# Patient Record
Sex: Female | Born: 1935 | Race: Black or African American | Hispanic: No | Smoking: Never smoker
Health system: Southern US, Community
[De-identification: ages and names within clinical notes are randomized; demographics above are authoritative.]

## PROBLEM LIST (undated history)

## (undated) DIAGNOSIS — M24573 Contracture, unspecified ankle: Secondary | ICD-10-CM

## (undated) DIAGNOSIS — F32A Depression, unspecified: Secondary | ICD-10-CM

## (undated) DIAGNOSIS — N189 Chronic kidney disease, unspecified: Secondary | ICD-10-CM

## (undated) DIAGNOSIS — G8929 Other chronic pain: Secondary | ICD-10-CM

## (undated) DIAGNOSIS — M24576 Contracture, unspecified foot: Secondary | ICD-10-CM

## (undated) DIAGNOSIS — K5900 Constipation, unspecified: Secondary | ICD-10-CM

## (undated) DIAGNOSIS — F329 Major depressive disorder, single episode, unspecified: Secondary | ICD-10-CM

## (undated) DIAGNOSIS — M1990 Unspecified osteoarthritis, unspecified site: Secondary | ICD-10-CM

## (undated) DIAGNOSIS — E785 Hyperlipidemia, unspecified: Secondary | ICD-10-CM

## (undated) DIAGNOSIS — I10 Essential (primary) hypertension: Secondary | ICD-10-CM

## (undated) DIAGNOSIS — R279 Unspecified lack of coordination: Secondary | ICD-10-CM

## (undated) DIAGNOSIS — K219 Gastro-esophageal reflux disease without esophagitis: Secondary | ICD-10-CM

## (undated) DIAGNOSIS — I5022 Chronic systolic (congestive) heart failure: Secondary | ICD-10-CM

## (undated) DIAGNOSIS — I4891 Unspecified atrial fibrillation: Secondary | ICD-10-CM

## (undated) DIAGNOSIS — Z9981 Dependence on supplemental oxygen: Secondary | ICD-10-CM

## (undated) DIAGNOSIS — M6281 Muscle weakness (generalized): Secondary | ICD-10-CM

## (undated) DIAGNOSIS — R609 Edema, unspecified: Secondary | ICD-10-CM

## (undated) DIAGNOSIS — I509 Heart failure, unspecified: Secondary | ICD-10-CM

## (undated) HISTORY — PX: OTHER SURGICAL HISTORY: SHX169

## (undated) HISTORY — DX: Unspecified lack of coordination: R27.9

## (undated) HISTORY — DX: Contracture, unspecified foot: M24.573

## (undated) HISTORY — DX: Depression, unspecified: F32.A

## (undated) HISTORY — DX: Chronic systolic (congestive) heart failure: I50.22

## (undated) HISTORY — DX: Constipation, unspecified: K59.00

## (undated) HISTORY — DX: Contracture, unspecified ankle: M24.576

## (undated) HISTORY — DX: Unspecified atrial fibrillation: I48.91

## (undated) HISTORY — DX: Major depressive disorder, single episode, unspecified: F32.9

## (undated) HISTORY — DX: Muscle weakness (generalized): M62.81

## (undated) HISTORY — DX: Edema, unspecified: R60.9

## (undated) HISTORY — DX: Other chronic pain: G89.29

## (undated) HISTORY — DX: Heart failure, unspecified: I50.9

## (undated) HISTORY — DX: Unspecified osteoarthritis, unspecified site: M19.90

## (undated) HISTORY — DX: Essential (primary) hypertension: I10

## (undated) HISTORY — DX: Chronic kidney disease, unspecified: N18.9

## (undated) HISTORY — DX: Dependence on supplemental oxygen: Z99.81

## (undated) HISTORY — DX: Gastro-esophageal reflux disease without esophagitis: K21.9

## (undated) HISTORY — PX: FRACTURE SURGERY: SHX138

## (undated) HISTORY — DX: Hyperlipidemia, unspecified: E78.5

---

## 2012-03-22 ENCOUNTER — Other Ambulatory Visit: Payer: Self-pay | Admitting: Internal Medicine

## 2012-03-22 DIAGNOSIS — M25512 Pain in left shoulder: Secondary | ICD-10-CM

## 2012-03-26 ENCOUNTER — Ambulatory Visit (HOSPITAL_COMMUNITY)
Admission: RE | Admit: 2012-03-26 | Discharge: 2012-03-26 | Disposition: A | Discharge status: Home / Self Care | Payer: Medicare Other | Source: Ambulatory Visit | Attending: Internal Medicine | Admitting: Internal Medicine

## 2012-03-26 DIAGNOSIS — M779 Enthesopathy, unspecified: Secondary | ICD-10-CM | POA: Insufficient documentation

## 2012-03-26 DIAGNOSIS — S43439A Superior glenoid labrum lesion of unspecified shoulder, initial encounter: Secondary | ICD-10-CM | POA: Insufficient documentation

## 2012-03-26 DIAGNOSIS — M25512 Pain in left shoulder: Secondary | ICD-10-CM

## 2012-03-26 DIAGNOSIS — S43429A Sprain of unspecified rotator cuff capsule, initial encounter: Secondary | ICD-10-CM | POA: Insufficient documentation

## 2012-03-26 DIAGNOSIS — X58XXXA Exposure to other specified factors, initial encounter: Secondary | ICD-10-CM | POA: Insufficient documentation

## 2012-03-26 DIAGNOSIS — S43499A Other sprain of unspecified shoulder joint, initial encounter: Secondary | ICD-10-CM | POA: Insufficient documentation

## 2012-03-26 DIAGNOSIS — S46819A Strain of other muscles, fascia and tendons at shoulder and upper arm level, unspecified arm, initial encounter: Secondary | ICD-10-CM | POA: Insufficient documentation

## 2012-07-04 ENCOUNTER — Non-Acute Institutional Stay (SKILLED_NURSING_FACILITY): Payer: Commercial Managed Care - PPO | Admitting: Internal Medicine

## 2012-07-04 DIAGNOSIS — I4891 Unspecified atrial fibrillation: Secondary | ICD-10-CM

## 2012-07-04 DIAGNOSIS — I509 Heart failure, unspecified: Secondary | ICD-10-CM

## 2012-07-04 DIAGNOSIS — I10 Essential (primary) hypertension: Secondary | ICD-10-CM

## 2012-07-04 DIAGNOSIS — E876 Hypokalemia: Secondary | ICD-10-CM

## 2012-07-04 NOTE — Progress Notes (Signed)
PROGRESS NOTE  DATE: 07/04/12  FACILITY: [facility]  LEVEL OF CARE: SNF  Routine Visit  CHIEF COMPLAINT:  Manage CHF, atrial fibrillation and hypertension  HISTORY OF PRESENT ILLNESS:  REASSESSMENT OF ONGOING PROBLEMS:  1. CHF:The patient relates to significant weight changes, denies sob, DOE, orthopnea, PNDs, palpitations or chest pain.  CHF is  unstable.  No complications form the medications being used.  I was notified by the dietitian that patient has been having significant weight gain. She has gained 11.9% which is 19.2 pounds 180 days.  On 3/24 weight was 118.4, 3/7 teammate was 176.6, 3/10 weight was 172. Patient admits to increased lower extremity swelling.  2. ATRIAL FIBRILLATION: the patients a-fib remains stable.  The patient denies DOE, tachycardia, orthopnea, transient neurological sx, pedal edema, palpitations, & PNDs.  No complications noted from the medications currently being used.  3. HTN: Pt 's HTN remains stable.  Denies CP, sob, DOE, pedal edema, headaches, dizziness or visual disturbances.  No complications from the meds currently being used.  Last BP : 125/70.  PAST MEDICAL HISTORY : Reviewed.  No changes.  CURRENT MEDICATIONS: Reviewed per [facility]  REVIEW OF SYSTEMS:  GENERAL: no change in appetite, no fatigue, has weight changes, no fever, chills or weakness RESPIRATORY: no cough, SOB, DOE,, wheezing, hemoptysis CARDIAC: no chest pain, or palpitations. Complains of increased bilateral lower extremity swelling GI: no abdominal pain, diarrhea, constipation, heart burn, nausea or vomiting  PHYSICAL EXAMINATION  VS:  T 98              P 63            RR 20          BP             POX %                WT (Lb) 180.4  GENERAL: no acute distress, normal body habitus EYES: conjunctivae normal, sclerae normal, normal eye lids NECK: supple, trachea midline, no neck masses, no thyroid tenderness, no thyromegaly LYMPHATICS: no LAN in the neck, no supraclavicular  LAN RESPIRATORY: breathing is even & unlabored, BS CTAB CARDIAC: RRR, no murmur,no extra heart sounds, +2 edema bilaterally GI: abdomen soft, normal BS, no masses, no tenderness, no hepatomegaly, no splenomegaly PSYCHIATRIC: the patient is alert & oriented to person, affect & behavior appropriate  LABS/RADIOLOGY:  12/13 BMP normal 8/13 BMP and CBC normal, total protein 5.7, albumin 3.2 otherwise liver profile normal, TSH 3.772, vitamin B12 level greater than 2000  ASSESSMENT/PLAN:  1. CHF exacerbation-unstable problem. Start Lasix 20 mg daily. Continue wwts. 2 atrial fibrillation-rate controlled 3. hypertension-well-controlled 4. hypokalemia - since I am initiating Lasix we'll start KCL 10 mEq daily as well. 5. constipation-well-controlled. 6. V58.69- check CBC and CMP.  CPT CODE: 32440

## 2012-07-18 ENCOUNTER — Non-Acute Institutional Stay (SKILLED_NURSING_FACILITY): Payer: Medicare Other | Admitting: Internal Medicine

## 2012-07-18 DIAGNOSIS — R05 Cough: Secondary | ICD-10-CM

## 2012-07-31 NOTE — Progress Notes (Signed)
[name], female   DOB: [birth year], 79 y.o.   MRN: [id number]        PROGRESS NOTE  DATE:  07/18/2012   FACILITY: [facility]   LEVEL OF CARE: SNF  Acute Visit  CHIEF COMPLAINT:  Manage cough.    HISTORY OF PRESENT ILLNESS: I was requested by the staff to assess the patient regarding above problem(s):  Staff report that patient is having a productive cough with mucus for several days.  Patient denies shortness of breath, fever, chills or night sweats.  Patient cannot identify precipitating or alleviating factors.  There is no temporal relationship.  PAST MEDICAL HISTORY : Reviewed.  No changes.  CURRENT MEDICATIONS: Reviewed per [facility]  REVIEW OF SYSTEMS:  GENERAL: no change in appetite, no fatigue, no weight changes, no fever, chills or weakness RESPIRATORY: complains of productive cough, SOB, DOE,, wheezing, hemoptysis CARDIAC: no chest pain, edema or palpitations  PHYSICAL EXAMINATION  VS:  T  98.3      P 68       RR  18     BP 111/67     POX % 97 room air      WT (Lb)  GENERAL: no acute distress, normal body habitus NECK: supple, trachea midline, no neck masses, no thyroid tenderness, no thyromegaly LYMPHATICS: no LAN in the neck, no supraclavicular LAN RESPIRATORY: breathing is even & unlabored, BS CTAB CARDIAC: RRR, no murmur,no extra heart sounds EDEMA/VARICOSITIES:  +2 bilateral lower extremity edema  ARTERIAL:  pedal pulses nonpalpable   ASSESSMENT/PLAN:  Cough. New problem.  Obtain chest x-ray.  Start Mucinex 600 mg b.i.d. For one week.    CPT CODE: 40981

## 2012-08-06 ENCOUNTER — Other Ambulatory Visit: Payer: Self-pay | Admitting: *Deleted

## 2012-08-06 MED ORDER — OXYCODONE HCL 5 MG PO TABS: ORAL_TABLET | ORAL | Status: DC

## 2012-08-26 ENCOUNTER — Encounter: Payer: Self-pay | Admitting: Adult Health

## 2012-08-26 ENCOUNTER — Non-Acute Institutional Stay (SKILLED_NURSING_FACILITY): Payer: Medicare Other | Admitting: Adult Health

## 2012-08-26 DIAGNOSIS — I4891 Unspecified atrial fibrillation: Secondary | ICD-10-CM

## 2012-08-26 DIAGNOSIS — K5900 Constipation, unspecified: Secondary | ICD-10-CM

## 2012-08-26 DIAGNOSIS — M1990 Unspecified osteoarthritis, unspecified site: Secondary | ICD-10-CM

## 2012-08-26 DIAGNOSIS — I509 Heart failure, unspecified: Secondary | ICD-10-CM

## 2012-08-26 DIAGNOSIS — I10 Essential (primary) hypertension: Secondary | ICD-10-CM

## 2012-08-26 NOTE — Progress Notes (Signed)
[name], female   DOB: Feb 04, 1923, 79 y.o.   MRN: [id number] [facility]  Allergies  Allergen Reactions  . Codeine      Chief Complaint  Patient presents with  . Medical Managment of Chronic Issues    HPI: She is being seen for the management of her chronic illnesses; there is no change in her overall status staff does not report any concerns at this time.   Past Medical History  Diagnosis Date  . CHF (congestive heart failure)   . A-fib   . Hypertension   . Constipation   . Arthritis    Past Surgical History  Procedure Laterality Date  . Right ankle orif      VITAL SIGNS BP 105/56  Pulse 90  Wt 179 lb (81.194 kg)   Patient's Medications  New Prescriptions   No medications on file  Previous Medications   AMIODARONE (PACERONE) 200 MG TABLET    Take 200 mg by mouth daily.   CALCIUM CARBONATE (TUMS - DOSED IN MG ELEMENTAL CALCIUM) 500 MG CHEWABLE TABLET    Chew 1 tablet by mouth 3 (three) times daily.   CHOLECALCIFEROL (VITAMIN D) 1000 UNITS TABLET    Take 2,000 Units by mouth daily.   DICLOFENAC SODIUM (VOLTAREN) 1 % GEL    Apply 4 g topically 2 (two) times daily. To both knees   FLUTICASONE (FLONASE) 50 MCG/ACT NASAL SPRAY    Place 2 sprays into the nose daily.   FUROSEMIDE (LASIX) 80 MG TABLET    Take 80 mg by mouth 2 (two) times daily.   GLUCOSAMINE-CHONDROITIN 500-400 MG TABLET    Take 2 tablets by mouth 2 (two) times daily.   LISINOPRIL (PRINIVIL,ZESTRIL) 2.5 MG TABLET    Take 2.5 mg by mouth daily.   METOPROLOL SUCCINATE (TOPROL-XL) 25 MG 24 HR TABLET    Take 12.5 mg by mouth daily.   POLYETHYL GLYCOL-PROPYL GLYCOL (SYSTANE) 0.4-0.3 % SOLN    Apply 1 drop to eye daily.   POLYETHYLENE GLYCOL (MIRALAX / GLYCOLAX) PACKET    Take 17 g by mouth daily.   POTASSIUM CHLORIDE SA (K-DUR,KLOR-CON) 20 MEQ TABLET    Take 40 mEq by mouth 2 (two) times daily.   RIVAROXABAN (XARELTO) 15 MG TABS TABLET    Take 15 mg by mouth daily.   SENNOSIDES-DOCUSATE SODIUM  (SENOKOT-S) 8.6-50 MG TABLET    Take 2 tablets by mouth daily.   VITAMIN B-12 (CYANOCOBALAMIN) 1000 MCG TABLET    Take 1,000 mcg by mouth daily.  Modified Medications   Modified Medication Previous Medication   OXYCODONE (OXY IR/ROXICODONE) 5 MG IMMEDIATE RELEASE TABLET oxyCODONE (OXY IR/ROXICODONE) 5 MG immediate release tablet      Take 1 tablet every 3 hours as needed for pain    Take 1 tablet every 3 hours as needed.  Discontinued Medications   No medications on file    SIGNIFICANT DIAGNOSTIC EXAMS 03-22-12: glucose 103; bun 28; creat 0.95; k+ 4.8; na++ 133 07-11-12: glucose 89; bun 24; creat 1.15; k+ 3.9; na++ 139   Review of Systems  Constitutional: Negative for malaise/fatigue.  Respiratory: Negative for cough and shortness of breath.   Cardiovascular: Positive for leg swelling. Negative for chest pain.  Gastrointestinal: Negative for heartburn, abdominal pain and constipation.  Musculoskeletal: Negative for myalgias and joint pain.  Skin: Negative.   Neurological: Negative for headaches.  Psychiatric/Behavioral: Negative for depression. The patient does not have insomnia.      Physical Exam  Constitutional:  She appears well-developed and well-nourished.  Neck: Neck supple. No JVD present. No thyromegaly present.  Cardiovascular: Normal rate, regular rhythm and intact distal pulses.   Respiratory: Effort normal and breath sounds normal. No respiratory distress.  GI: Soft. Bowel sounds are normal. She exhibits no distension. There is no tenderness.  Musculoskeletal: She exhibits edema.  In wheelchair; 2+ pedal edema; wears ted hose  Neurological: She is alert.  Skin: Skin is warm and dry.       ASSESSMENT/ PLAN:  A-fib Is stable will continue xarelto 15 mg daily and will continue amiodarone 200 mg daily for rate control and will monitor her status   Essential hypertension, benign Will continue lisinopril 2.5 mg daily and toprol xl 12.5 mg daily and will  monitor  CHF (congestive heart failure) Will continue lasix 80 mg twice daily with k+ 40 meq twice daily and will monitor her status   Unspecified constipation Will continue miralax daily and will continue senna s 2 tabs daily   Osteoarthritis Has in multiple joints will continue voltaren gel 4 gm in both knees twice daily and will continue glucosamine chondroitin 2 tabs twice daily and will continue oxycodone 5 mg every 3 hours as needed and will monitor     Time spent with patient 45 minutes

## 2012-09-10 ENCOUNTER — Non-Acute Institutional Stay (SKILLED_NURSING_FACILITY): Payer: Medicare Other | Admitting: Internal Medicine

## 2012-09-10 DIAGNOSIS — M75110 Incomplete rotator cuff tear or rupture of unspecified shoulder, not specified as traumatic: Secondary | ICD-10-CM

## 2012-09-20 ENCOUNTER — Other Ambulatory Visit (HOSPITAL_COMMUNITY): Payer: Self-pay | Admitting: Unknown Physician Specialty

## 2012-09-20 ENCOUNTER — Other Ambulatory Visit (HOSPITAL_COMMUNITY): Payer: Self-pay | Admitting: Cardiology

## 2012-09-20 DIAGNOSIS — I509 Heart failure, unspecified: Secondary | ICD-10-CM

## 2012-09-20 DIAGNOSIS — R0602 Shortness of breath: Secondary | ICD-10-CM

## 2012-09-24 ENCOUNTER — Ambulatory Visit (HOSPITAL_COMMUNITY)
Admission: RE | Admit: 2012-09-24 | Discharge: 2012-09-24 | Disposition: A | Discharge status: Home / Self Care | Payer: Medicare Other | Source: Ambulatory Visit | Attending: Internal Medicine | Admitting: Internal Medicine

## 2012-09-24 DIAGNOSIS — R0602 Shortness of breath: Secondary | ICD-10-CM

## 2012-09-24 DIAGNOSIS — I10 Essential (primary) hypertension: Secondary | ICD-10-CM | POA: Insufficient documentation

## 2012-09-24 DIAGNOSIS — I509 Heart failure, unspecified: Secondary | ICD-10-CM | POA: Insufficient documentation

## 2012-09-24 DIAGNOSIS — E119 Type 2 diabetes mellitus without complications: Secondary | ICD-10-CM | POA: Insufficient documentation

## 2012-09-24 NOTE — Progress Notes (Signed)
Echo Lab  2D Echocardiogram completed.  Jerrel Tiberio L Darianny Momon, RDCS 09/24/2012 1:18 PM

## 2012-09-30 DIAGNOSIS — M75110 Incomplete rotator cuff tear or rupture of unspecified shoulder, not specified as traumatic: Secondary | ICD-10-CM | POA: Insufficient documentation

## 2012-09-30 NOTE — Progress Notes (Signed)
[name], female   DOB: 1923/02/24, 79 y.o.   MRN: [id number]        PROGRESS NOTE  DATE: 09/10/2012  FACILITY:  [facility]  LEVEL OF CARE: SNF (31)  Acute Visit  CHIEF COMPLAINT:  Manage left shoulder pain.    HISTORY OF PRESENT ILLNESS: I was requested by the staff to assess the patient regarding above problem(s):  Patient has a diagnosis of rotator cuff tear in her left shoulder.  She was seen by Orthopedic Surgery and a cortisone injection given, but she says there has not been much relief.  Surgery was not recommended.  Only p.r.n. pain medications were recommended.  Patient denies radiation of pain.  Range of motion exacerbates pain.  Current pain medications are not sufficiently controlling her pain.  She denies symptoms such as numbness, tingling, or weakness.  There is no temporal relationship.    PAST MEDICAL HISTORY : Reviewed.  No changes.  CURRENT MEDICATIONS: Reviewed per [facility]  REVIEW OF SYSTEMS:  GENERAL: no change in appetite, no fatigue, no weight changes, no fever, chills or weakness RESPIRATORY: no cough, SOB, DOE,, wheezing, hemoptysis CARDIAC: no chest pain, edema or palpitations GI: no abdominal pain, diarrhea, constipation, heart burn, nausea or vomiting  PHYSICAL EXAMINATION  VS:  T  97.4      P  56     RR  20     BP 105/56     POX %       WT (Lb)  GENERAL: no acute distress, normal body habitus NECK: supple, trachea midline, no neck masses, no thyroid tenderness, no thyromegaly RESPIRATORY: breathing is even & unlabored, BS CTAB CARDIAC: RRR, no murmur,no extra heart sounds, no edema GI: abdomen soft, normal BS, no masses, no tenderness, no hepatomegaly, no splenomegaly PSYCHIATRIC: the patient is alert & oriented to person, affect & behavior appropriate MUSCULOSKELETAL:   EXTREMITIES:   LEFT UPPER EXTREMITY:  Left shoulder tender to palpation.  Strength intact.  Range of motion difficult to perform due to  pain.    ASSESSMENT/PLAN:  Left shoulder rotator cuff tear 726.13.  Uncontrolled pain.  Start Lidoderm patch 5% to the left shoulder for 12 hours a day.    CPT CODE: 13086

## 2012-10-01 ENCOUNTER — Other Ambulatory Visit: Payer: Self-pay | Admitting: *Deleted

## 2012-10-01 MED ORDER — OXYCODONE HCL 5 MG PO TABS: ORAL_TABLET | ORAL | Status: DC

## 2012-10-14 ENCOUNTER — Non-Acute Institutional Stay (SKILLED_NURSING_FACILITY): Payer: Medicare Other | Admitting: Adult Health

## 2012-10-14 DIAGNOSIS — K5900 Constipation, unspecified: Secondary | ICD-10-CM

## 2012-10-14 DIAGNOSIS — I5040 Unspecified combined systolic (congestive) and diastolic (congestive) heart failure: Secondary | ICD-10-CM

## 2012-10-14 DIAGNOSIS — I10 Essential (primary) hypertension: Secondary | ICD-10-CM

## 2012-10-14 DIAGNOSIS — M1990 Unspecified osteoarthritis, unspecified site: Secondary | ICD-10-CM

## 2012-10-14 DIAGNOSIS — I4891 Unspecified atrial fibrillation: Secondary | ICD-10-CM

## 2012-10-16 ENCOUNTER — Encounter: Payer: Self-pay | Admitting: Adult Health

## 2012-10-16 ENCOUNTER — Non-Acute Institutional Stay (SKILLED_NURSING_FACILITY): Payer: Medicare Other | Admitting: Adult Health

## 2012-10-16 DIAGNOSIS — K5900 Constipation, unspecified: Secondary | ICD-10-CM | POA: Insufficient documentation

## 2012-10-16 DIAGNOSIS — I5040 Unspecified combined systolic (congestive) and diastolic (congestive) heart failure: Secondary | ICD-10-CM

## 2012-10-16 DIAGNOSIS — I5042 Chronic combined systolic (congestive) and diastolic (congestive) heart failure: Secondary | ICD-10-CM | POA: Insufficient documentation

## 2012-10-16 DIAGNOSIS — M1990 Unspecified osteoarthritis, unspecified site: Secondary | ICD-10-CM | POA: Insufficient documentation

## 2012-10-16 DIAGNOSIS — I110 Hypertensive heart disease with heart failure: Secondary | ICD-10-CM | POA: Insufficient documentation

## 2012-10-16 DIAGNOSIS — I480 Paroxysmal atrial fibrillation: Secondary | ICD-10-CM | POA: Insufficient documentation

## 2012-10-16 DIAGNOSIS — R5383 Other fatigue: Secondary | ICD-10-CM | POA: Insufficient documentation

## 2012-10-16 DIAGNOSIS — R5381 Other malaise: Secondary | ICD-10-CM | POA: Insufficient documentation

## 2012-10-16 NOTE — Assessment & Plan Note (Addendum)
Will continue her xarelto 15 mg daily and amiodarone 200 mg daily and digoxin 0.125 mg daily  for rate control will continue to monitor her status

## 2012-10-16 NOTE — Assessment & Plan Note (Addendum)
Has been seen by cardiology; her lasix was changed to demadex 20 mg twice daily with k+ 40 meq twice daily will continue her 1800 cc fluid restriction  and will monitor her status

## 2012-10-16 NOTE — Assessment & Plan Note (Signed)
Has in multiple joints will continue voltaren gel 4 gm in both knees twice daily and will continue glucosamine chondroitin 2 tabs twice daily and will continue oxycodone 5 mg every 3 hours as needed and will monitor

## 2012-10-16 NOTE — Assessment & Plan Note (Signed)
Is stable will continue lisinopril 2.5 mg daily and toprol xl 12. 5 mg daily and will continue to monitor

## 2012-10-16 NOTE — Assessment & Plan Note (Signed)
Is stable will continue xarelto 15 mg daily and will continue amiodarone 200 mg daily for rate control and will monitor her status

## 2012-10-16 NOTE — Progress Notes (Signed)
[name], female   DOB: 1922-07-19, 79 y.o.   MRN: [id number]  [name], female   DOB: [birth year], 79 y.o.   MRN: [id number] [facility]  Allergies  Allergen Reactions  . Codeine      Chief Complaint  Patient presents with  . Medical Managment of Chronic Issues    HPI: She is being seen for the management of her chronic illnesses; there is no change in her overall status She is complaining of left shoulder pain   Past Medical History  Diagnosis Date  . CHF (congestive heart failure)   . A-fib   . Hypertension   . Constipation   . Arthritis    Past Surgical History  Procedure Laterality Date  . Right ankle orif      VITAL SIGNS BP 123/64  Pulse 58  Ht 5\' 4"  (1.626 m)  Wt 182 lb 6.4 oz (82.736 kg)  BMI 31.29 kg/m2   Patient's Medications  New Prescriptions   No medications on file  Previous Medications   AMIODARONE (PACERONE) 200 MG TABLET    Take 200 mg by mouth daily.   CALCIUM CARBONATE (TUMS - DOSED IN MG ELEMENTAL CALCIUM) 500 MG CHEWABLE TABLET    Chew 1 tablet by mouth 3 (three) times daily.   CHOLECALCIFEROL (VITAMIN D) 1000 UNITS TABLET    Take 2,000 Units by mouth daily.   DICLOFENAC SODIUM (VOLTAREN) 1 % GEL    Apply 4 g topically 2 (two) times daily. To both knees   FLUTICASONE (FLONASE) 50 MCG/ACT NASAL SPRAY    Place 2 sprays into the nose daily.   GLUCOSAMINE-CHONDROITIN 500-400 MG TABLET    Take 2 tablets by mouth 2 (two) times daily.   LIDOCAINE (LIDODERM) 5 %    Place 1 patch onto the skin daily. Remove & Discard patch within 12 hours to left shoulder   LISINOPRIL (PRINIVIL,ZESTRIL) 2.5 MG TABLET    Take 2.5 mg by mouth daily.   METOPROLOL SUCCINATE (TOPROL-XL) 25 MG 24 HR TABLET    Take 12.5 mg by mouth daily.   OXYCODONE (OXY IR/ROXICODONE) 5 MG IMMEDIATE RELEASE TABLET    Take 1 tablet every 3 hours as needed for pain   POLYETHYL GLYCOL-PROPYL GLYCOL (SYSTANE) 0.4-0.3 % SOLN    Apply 1 drop to eye daily.   POLYETHYLENE GLYCOL (MIRALAX / GLYCOLAX) PACKET    Take 17 g by mouth daily.   POTASSIUM CHLORIDE SA (K-DUR,KLOR-CON) 20 MEQ TABLET    Take 40 mEq by mouth 2 (two) times daily.   RIVAROXABAN (XARELTO) 15 MG TABS TABLET    Take 15 mg by mouth daily.   SENNOSIDES-DOCUSATE SODIUM (SENOKOT-S) 8.6-50 MG TABLET    Take 2 tablets by mouth daily.   TORSEMIDE (DEMADEX) 20 MG TABLET    Take 20 mg by mouth 2 (two) times daily.   VITAMIN B-12 (CYANOCOBALAMIN) 1000 MCG TABLET    Take 1,000 mcg by mouth daily.  Modified Medications   No medications on file  Discontinued Medications   FUROSEMIDE (LASIX) 80 MG TABLET    Take 80 mg by mouth 2 (two) times daily.    SIGNIFICANT DIAGNOSTIC EXAMS  09-24-12: 2-d echo: - Left ventricle: The cavity size was moderately dilated.There was mild concentric hypertrophy. Systolic function was severely reduced. The estimated ejection fraction wasin the range of 20% to 25%. Diffuse hypokinesis.-- Mitral valve: Moderate regurgitation. - Left atrium: The atrium was mildly dilated.      LABS REVIEWED  03-22-12: glucose 103; bun 28; creat 0.95; k+ 4.8; na++ 133 07-11-12: glucose 89; bun 24; creat 1.15; k+ 3.9; na++ 139  08-22-12: wbc 8.7; hgb 11.2; hct 32.4; mcv 86.9; plt 287; chol 148; ldl 90; trig 76; tsh 1.085   Review of Systems  Constitutional: Negative for malaise/fatigue.  Respiratory: Negative for cough and shortness of breath.   Cardiovascular: Positive for leg swelling. Negative for chest pain.  Gastrointestinal: Negative for heartburn, abdominal pain and constipation.  Musculoskeletal: Positive for joint pain. Negative for myalgias.  Skin: Negative.   Neurological: Negative for headaches.  Psychiatric/Behavioral: Negative for depression. The patient does not have insomnia.      Physical Exam  Constitutional: She appears well-developed and well-nourished.  Neck: Neck supple. No JVD present. No thyromegaly present.  Cardiovascular: Normal rate, regular  rhythm and intact distal pulses.   Respiratory: Effort normal and breath sounds normal. No respiratory distress.  GI: Soft. Bowel sounds are normal. She exhibits no distension. There is no tenderness.  Musculoskeletal: She exhibits edema.  In wheelchair; 2+ pedal edema; wears ted hose  Neurological: She is alert.  Skin: Skin is warm and dry.       ASSESSMENT/ PLAN:  A-fib Will continue her xarelto 15 mg daily and amiodarone 200 mg daily and digoxin 0.125 mg daily  for rate control will continue to monitor her status   Essential hypertension, benign Is stable will continue lisinopril 2.5 mg daily and toprol xl 12. 5 mg daily and will continue to monitor   CHF (congestive heart failure) Has been seen by cardiology; her lasix was changed to demadex 20 mg twice daily with k+ 40 meq twice daily will continue her 1800 cc fluid restriction  and will monitor her status   Unspecified constipation Will continue senna s 2 tabs daily and miralax daily   Osteoarthritis She will be setup for a steroid injection in her left shoulder; will continue her voltaren gel to her knees twice daily and her glucosamine chondroitin 2 tabs twice daily with oxycodone 5 mg every 3 hours as needed for pain and her lidoderm patch to her left shoulder and will monitor her status

## 2012-10-16 NOTE — Assessment & Plan Note (Signed)
Will continue lasix 80 mg twice daily with k+ 40 meq twice daily and will monitor her status

## 2012-10-16 NOTE — Assessment & Plan Note (Signed)
There are several reasons which could be the cause of her fatigue; vitamin deficiency; (not likely as she is on supplement); hypothyroidism (her last tsh wast normal) her low ef of 20-30% and depression; will check tsh; b12; and folate; if these are normal; will then start her on medications to treat depression and will monitor her status

## 2012-10-16 NOTE — Assessment & Plan Note (Signed)
Will continue lisinopril 2.5 mg daily and toprol xl 12.5 mg daily and will monitor

## 2012-10-16 NOTE — Assessment & Plan Note (Signed)
Will continue miralax daily and will continue senna s 2 tabs daily

## 2012-10-16 NOTE — Progress Notes (Signed)
[name], female   DOB: 12/07/1922, 79 y.o.   MRN: [id number] [facility]  Allergies  Allergen Reactions  . Codeine      Chief Complaint  Patient presents with  . Acute Visit    patient concerns    HPI: She is complaining of feeling fatigues and tired all the time; she states she has no interest in her normal activities and wants to sleep all the time; she denies hair loss; or excessive dry skin; states her appetite is poor; does not feel rested in the am. We had a prolonged discussion about the possible causes of her issues; we had a prolonged discussion about her code status she desires to be a dnr.   Past Medical History  Diagnosis Date  . CHF (congestive heart failure)   . A-fib   . Hypertension   . Constipation   . Arthritis     Past Surgical History  Procedure Laterality Date  . Right ankle orif      VITAL SIGNS BP 100/59  Pulse 68  Ht 5\' 4"  (1.626 m)  Wt 182 lb 6.4 oz (82.736 kg)  BMI 31.29 kg/m2   Patient's Medications  New Prescriptions   No medications on file  Previous Medications   AMIODARONE (PACERONE) 200 MG TABLET    Take 200 mg by mouth daily.   CALCIUM CARBONATE (TUMS - DOSED IN MG ELEMENTAL CALCIUM) 500 MG CHEWABLE TABLET    Chew 1 tablet by mouth 3 (three) times daily.   CHOLECALCIFEROL (VITAMIN D) 1000 UNITS TABLET    Take 2,000 Units by mouth daily.   DICLOFENAC SODIUM (VOLTAREN) 1 % GEL    Apply 4 g topically 2 (two) times daily. To both knees   FLUTICASONE (FLONASE) 50 MCG/ACT NASAL SPRAY    Place 2 sprays into the nose daily.   GLUCOSAMINE-CHONDROITIN 500-400 MG TABLET    Take 2 tablets by mouth 2 (two) times daily.   LIDOCAINE (LIDODERM) 5 %    Place 1 patch onto the skin daily. Remove & Discard patch within 12 hours to left shoulder   LISINOPRIL (PRINIVIL,ZESTRIL) 2.5 MG TABLET    Take 2.5 mg by mouth daily.   METOPROLOL SUCCINATE (TOPROL-XL) 25 MG 24 HR TABLET    Take 12.5 mg by mouth daily.   OXYCODONE (OXY IR/ROXICODONE)  5 MG IMMEDIATE RELEASE TABLET    Take 1 tablet every 3 hours as needed for pain   POLYETHYL GLYCOL-PROPYL GLYCOL (SYSTANE) 0.4-0.3 % SOLN    Apply 1 drop to eye daily.   POLYETHYLENE GLYCOL (MIRALAX / GLYCOLAX) PACKET    Take 17 g by mouth daily.   POTASSIUM CHLORIDE SA (K-DUR,KLOR-CON) 20 MEQ TABLET    Take 40 mEq by mouth 2 (two) times daily.   RIVAROXABAN (XARELTO) 15 MG TABS TABLET    Take 15 mg by mouth daily.   SENNOSIDES-DOCUSATE SODIUM (SENOKOT-S) 8.6-50 MG TABLET    Take 2 tablets by mouth daily.   TORSEMIDE (DEMADEX) 20 MG TABLET    Take 20 mg by mouth 2 (two) times daily.   VITAMIN B-12 (CYANOCOBALAMIN) 1000 MCG TABLET    Take 1,000 mcg by mouth daily.  Modified Medications   No medications on file  Discontinued Medications   No medications on file    SIGNIFICANT DIAGNOSTIC EXAMS  07-18-12: chest x-ray: no acute abnormality present   09-24-12: 2-d echo: - Left ventricle: The cavity size was moderately dilated.There was mild concentric hypertrophy. Systolic function was severely reduced.  The estimated ejection fraction wasin the range of 20% to 25%. Diffuse hypokinesis.-- Mitral valve: Moderate regurgitation. - Left atrium: The atrium was mildly dilated.      LABS REVIEWED  03-22-12: glucose 103; bun 28; creat 0.95; k+ 4.8; na++ 133 07-11-12: glucose 89; bun 24; creat 1.15; k+ 3.9; na++ 139  08-22-12: wbc 8.7; hgb 11.2; hct 32.4; mcv 86.9; plt 287; chol 148; ldl 90; trig 76; tsh 1.085   Review of Systems  Constitutional: HAS malaise/fatigue.  Respiratory: Negative for cough and shortness of breath.   Cardiovascular: Positive for leg swelling. Negative for chest pain.  Gastrointestinal: Negative for heartburn, abdominal pain and constipation.  Musculoskeletal: Positive for joint pain. Negative for myalgias.  Skin: Negative.   Neurological: Negative for headaches.  Psychiatric/Behavioral: HAS for depression. The patient does not have insomnia.      Physical Exam   Constitutional: She appears well-developed and well-nourished.  Neck: Neck supple. No JVD present. No thyromegaly present.  Cardiovascular: Normal rate, regular rhythm and intact distal pulses.   Respiratory: Effort normal and breath sounds normal. No respiratory distress.  GI: Soft. Bowel sounds are normal. She exhibits no distension. There is no tenderness.  Musculoskeletal: She exhibits edema.  In wheelchair; 2+ pedal edema; wears ted hose  Neurological: She is alert.  Skin: Skin is warm and dry.    ASSESSMENT/ PLAN:  Other malaise and fatigue There are several reasons which could be the cause of her fatigue; vitamin deficiency; (not likely as she is on supplement); hypothyroidism (her last tsh wast normal) her low ef of 20-30% and depression; will check tsh; b12; and folate; if these are normal; will then start her on medications to treat depression and will monitor her status   CHF (congestive heart failure) Her ef is 20-30%; will continue demadex 20 mg twice daily with k+ 40 meq twice daily; will continue 1800 cc fluid restriction; digoxin .125 mg daily; and will monitor her status     Time spent with patient 50 minutes.

## 2012-10-16 NOTE — Assessment & Plan Note (Signed)
Will continue senna s 2 tabs daily and miralax daily

## 2012-10-16 NOTE — Assessment & Plan Note (Signed)
Her ef is 20-30%; will continue demadex 20 mg twice daily with k+ 40 meq twice daily; will continue 1800 cc fluid restriction; digoxin .125 mg daily; and will monitor her status

## 2012-10-16 NOTE — Assessment & Plan Note (Signed)
She will be setup for a steroid injection in her left shoulder; will continue her voltaren gel to her knees twice daily and her glucosamine chondroitin 2 tabs twice daily with oxycodone 5 mg every 3 hours as needed for pain and her lidoderm patch to her left shoulder and will monitor her status

## 2012-12-16 ENCOUNTER — Non-Acute Institutional Stay (SKILLED_NURSING_FACILITY): Payer: Medicare Other | Admitting: Family

## 2012-12-16 ENCOUNTER — Encounter: Payer: Self-pay | Admitting: Family

## 2012-12-16 DIAGNOSIS — M1990 Unspecified osteoarthritis, unspecified site: Secondary | ICD-10-CM

## 2012-12-16 DIAGNOSIS — K5900 Constipation, unspecified: Secondary | ICD-10-CM

## 2012-12-16 DIAGNOSIS — I4891 Unspecified atrial fibrillation: Secondary | ICD-10-CM

## 2012-12-16 DIAGNOSIS — I5040 Unspecified combined systolic (congestive) and diastolic (congestive) heart failure: Secondary | ICD-10-CM

## 2012-12-16 DIAGNOSIS — I10 Essential (primary) hypertension: Secondary | ICD-10-CM

## 2012-12-16 NOTE — Progress Notes (Signed)
[name], female   DOB: 1922/10/06, 79 y.o.   MRN: [id number]  Allergies  Allergen Reactions  . Codeine      Chief Complaint  Patient presents with  . Acute Visit    patient concerns    HPI: Pt is being followed for medical management of chronic illnesses. Nursing staff reported hypotensive episode over the weekend. B/P  70/48, HR 63.  Pt complained of intermittent weakness and nausea during hypotensive episode. Pt is also followed by cardiologist, Dr. Jacinto Halim, for CHF, Hypertension and Afib.  Pt denies complaints at present and reports being in good health. No further concerns voiced by patient or nursing staff at present.   Past Medical History  Diagnosis Date  . CHF (congestive heart failure)   . A-fib   . Hypertension   . Constipation   . Arthritis      History   Laterality Date   Past Surgical History  Procedure Laterality Date  . Right ankle orif     Current Outpatient Prescriptions on File Prior to Visit  Medication Sig Dispense Refill  . amiodarone (PACERONE) 200 MG tablet Take 200 mg by mouth daily.      . calcium carbonate (TUMS - DOSED IN MG ELEMENTAL CALCIUM) 500 MG chewable tablet Chew 1 tablet by mouth 3 (three) times daily.      . cholecalciferol (VITAMIN D) 1000 UNITS tablet Take 2,000 Units by mouth daily.      . diclofenac sodium (VOLTAREN) 1 % GEL Apply 4 g topically 2 (two) times daily. To both knees      . fluticasone (FLONASE) 50 MCG/ACT nasal spray Place 2 sprays into the nose daily.      Marland Kitchen glucosamine-chondroitin 500-400 MG tablet Take 2 tablets by mouth 2 (two) times daily.      Marland Kitchen lidocaine (LIDODERM) 5 % Place 1 patch onto the skin daily. Remove & Discard patch within 12 hours to left shoulder      . lisinopril (PRINIVIL,ZESTRIL) 2.5 MG tablet Take 2.5 mg by mouth daily.      . metoprolol succinate (TOPROL-XL) 25 MG 24 hr tablet Take 12.5 mg by mouth daily.      Marland Kitchen oxyCODONE (OXY IR/ROXICODONE) 5 MG immediate release tablet Take 1  tablet every 3 hours as needed for pain  240 tablet  0  . Polyethyl Glycol-Propyl Glycol (SYSTANE) 0.4-0.3 % SOLN Apply 1 drop to eye daily.      . polyethylene glycol (MIRALAX / GLYCOLAX) packet Take 17 g by mouth daily.      . potassium chloride SA (K-DUR,KLOR-CON) 20 MEQ tablet Take 40 mEq by mouth 2 (two) times daily.      . Rivaroxaban (XARELTO) 15 MG TABS tablet Take 15 mg by mouth daily.      . sennosides-docusate sodium (SENOKOT-S) 8.6-50 MG tablet Take 2 tablets by mouth daily.      Marland Kitchen torsemide (DEMADEX) 20 MG tablet Take 20 mg by mouth 2 (two) times daily.       No current facility-administered medications on file prior to visit.                         SIGNIFICANT DIAGNOSTIC EXAMS  07-18-12: chest x-ray: no acute abnormality present   09-24-12: 2-d echo: - Left ventricle: The cavity size was moderately dilated.There was mild concentric hypertrophy. Systolic function was severely reduced. The estimated ejection fraction wasin the range of 20% to 25%. Diffuse hypokinesis.-- Mitral  valve: Moderate regurgitation. - Left atrium: The atrium was mildly dilated.         LABS REVIEWED  03-22-12: glucose 103; bun 28; creat 0.95; k+ 4.8; na++ 133 07-11-12: glucose 89; bun 24; creat 1.15; k+ 3.9; na++ 139  08-22-12: wbc 8.7; hgb 11.2; hct 32.4; mcv 86.9; plt 287; chol 148; ldl 90; trig 76; tsh 1.085  10-18-12 WBC 4.02, RBC-4.02, Hemoglobin 11.8, Hemocrit 34.1, MCV 84.8, MCH 29.4, MCHC 34.6,RDW-14.3,Platlet 322, Sodium 139, Potassium 4.5, Chloride 101, C02-28, Glucose-93, BUN-26, Creatinine 1.43, Calcium 9.8, TSH 2.313, Vitamin B12, Folate 19.2  Review of Systems  Constitutional: Negative.  Negative for fever and chills.  HENT: Negative.   Eyes: Negative.   Respiratory: Negative.   Cardiovascular: Positive for leg swelling.  Gastrointestinal: Negative.   Musculoskeletal: Negative.   Skin: Negative.   Neurological: Negative.  Negative for weakness.  Endo/Heme/Allergies:  Negative.   Psychiatric/Behavioral: Negative.         Physical Exam  Constitutional: She appears well-nourished.  HENT:  Head: Normocephalic.  Nose: Nose normal.  Eyes: Conjunctivae are normal.  Neck: No thyromegaly present.  Cardiovascular: Regular rhythm.  Bradycardia present.   Pulmonary/Chest: Effort normal.  Abdominal: Soft. Normal appearance and bowel sounds are normal.  Musculoskeletal:  DME-Wheelchair  Lymphadenopathy:    She has no cervical adenopathy.  Neurological: She is alert. GCS eye subscore is 4. GCS verbal subscore is 5. GCS motor subscore is 5.  Skin: Skin is warm and dry.      ASSESSMENT/ PLAN:   CHF (congestive heart failure) Her ef is 20-30%; will continue demadex 20 mg twice daily with k+ 40 meq twice daily; will continue 1800 cc fluid restriction; digoxin .125 mg daily; and will monitor her status. Will collaborate with Dr. Jacinto Halim for further updates.  Afib  Will continue Amiodarone and Digoxin for rate control; Xarelto for anticoagulopathy  Essential Hypertension Parameters in place for administration of Antihypertensives and Digoxin.  Will continue to monitor and collaborate with health care team to promote therapeutic outcome   Constipation Will continue plan-prn Miralax and senna. Medications reviewed  Arthritis Pt is stable and will continue with previous plan        Time spent with patient 50 minutes.

## 2012-12-31 ENCOUNTER — Non-Acute Institutional Stay (SKILLED_NURSING_FACILITY): Payer: Medicare Other | Admitting: Family

## 2012-12-31 ENCOUNTER — Encounter: Payer: Self-pay | Admitting: Family

## 2012-12-31 DIAGNOSIS — I509 Heart failure, unspecified: Secondary | ICD-10-CM

## 2012-12-31 DIAGNOSIS — I10 Essential (primary) hypertension: Secondary | ICD-10-CM

## 2012-12-31 MED ORDER — TORSEMIDE 20 MG PO TABS: 40.0000 mg | ORAL_TABLET | Freq: Two times a day (BID) | ORAL | Status: DC

## 2012-12-31 NOTE — Progress Notes (Signed)
[name], female   DOB: 10-27-1922, 79 y.o.   MRN: [id number] Date: 12/27/12  Faciltiy: [facility]   Chief Complaint  Patient presents with  . Acute Visit    Weight increase/Edema    HPI: Pt presents requiring an acute visit due to 7.8lb increase in 4 days.  Pt denies SOB/N/V/D/Fever/Chills, dyspnea, or tachypnea. However, pt reports increasing edema in the past week. Pt describes BLE as tight and filled with fluid.  Pt further reports associated symptoms of intermittent generalized weakness. Pt reports relief is obtained with rest and lying in a supine position.  Pt endorses consuming some fried foods-chicken and foods saturated with refined sugars.       Allergies  Allergen Reactions  . Codeine    Medications    Medication List       This list is accurate as of: 12/31/12 11:38 AM.  Always use your most recent med list.               amiodarone 200 MG tablet  Commonly known as:  PACERONE  Take 200 mg by mouth daily.     calcium carbonate 500 MG chewable tablet  Commonly known as:  TUMS - dosed in mg elemental calcium  Chew 1 tablet by mouth 3 (three) times daily.     cholecalciferol 1000 UNITS tablet  Commonly known as:  VITAMIN D  Take 2,000 Units by mouth daily.     diclofenac sodium 1 % Gel  Commonly known as:  VOLTAREN  Apply 4 g topically 2 (two) times daily. To both knees     fluticasone 50 MCG/ACT nasal spray  Commonly known as:  FLONASE  Place 2 sprays into the nose daily.     glucosamine-chondroitin 500-400 MG tablet  Take 2 tablets by mouth 2 (two) times daily.     lidocaine 5 %  Commonly known as:  LIDODERM  Place 1 patch onto the skin daily. Remove & Discard patch within 12 hours to left shoulder     lisinopril 2.5 MG tablet  Commonly known as:  PRINIVIL,ZESTRIL  Take 2.5 mg by mouth daily.     metoprolol succinate 25 MG 24 hr tablet  Commonly known as:  TOPROL-XL  Take 12.5 mg by mouth daily.     oxyCODONE 5 MG immediate  release tablet  Commonly known as:  Oxy IR/ROXICODONE  Take 1 tablet every 3 hours as needed for pain     polyethylene glycol packet  Commonly known as:  MIRALAX / GLYCOLAX  Take 17 g by mouth daily.     potassium chloride SA 20 MEQ tablet  Commonly known as:  K-DUR,KLOR-CON  Take 40 mEq by mouth 2 (two) times daily.     Rivaroxaban 15 MG Tabs tablet  Commonly known as:  XARELTO  Take 15 mg by mouth daily.     sennosides-docusate sodium 8.6-50 MG tablet  Commonly known as:  SENOKOT-S  Take 2 tablets by mouth daily.     SYSTANE 0.4-0.3 % Soln  Generic drug:  Polyethyl Glycol-Propyl Glycol  Apply 1 drop to eye daily.     torsemide 20 MG tablet  Commonly known as:  DEMADEX  Take 20 mg by mouth 2 (two) times daily.         DATA REVIEWED  Laboratory Studies: 03-22-12: glucose 103; bun 28; creat 0.95; k+ 4.8; na++ 133 07-11-12: glucose 89; bun 24; creat 1.15; k+ 3.9; na++ 139  08-22-12: wbc 8.7; hgb 11.2; hct 32.4;  mcv 86.9; plt 287; chol 148; ldl 90; trig 76; tsh 1.085  10-18-12 WBC 4.02, RBC-4.02, Hemoglobin 11.8, Hemocrit 34.1, MCV 84.8, MCH 29.4, MCHC 34.6,RDW-14.3,Platlet 322, Sodium 139, Potassium 4.5, Chloride 101, C02-28, Glucose-93, BUN-26, Creatinine 1.43, Calcium 9.8, TSH 2.313, Vitamin B12, Folate 19.2     Past Medical History  Diagnosis Date  . CHF (congestive heart failure)   . A-fib   . Hypertension   . Constipation   . Arthritis     Past Surgical History  Procedure Laterality Date  . Right ankle orif      History   Social History  . Marital Status: Widowed    Spouse Name: N/A    Number of Children: N/A  . Years of Education: N/A   Occupational History  . Not on file.   Social History Main Topics  . Smoking status: Unknown If Ever Smoked  . Smokeless tobacco: Not on file  . Alcohol Use: Not on file  . Drug Use: Not on file  . Sexual Activity: Not on file   Other Topics Concern  . Not on file   Social History Narrative  . No narrative on  file     Review of Systems  Constitutional: Positive for malaise/fatigue.  HENT: Negative.   Eyes: Negative.   Respiratory: Negative.   Cardiovascular: Positive for leg swelling. Negative for chest pain, palpitations, orthopnea and PND.  Gastrointestinal: Negative.   Genitourinary: Negative.   Musculoskeletal: Negative.   Skin: Negative.   Neurological: Negative.   Endo/Heme/Allergies: Negative.   Psychiatric/Behavioral: Negative.      Physical Exam Filed Vitals:   12/27/12 1126  BP: 129/77  Pulse: 67  Temp: 98.1 F (36.7 C)  Resp: 18  Weight: 179 lb 6.4 oz (81.375 kg)  SpO2: 98%   Body mass index is 30.78 kg/(m^2). Physical Exam  Constitutional: She appears well-developed and well-nourished. No distress.  HENT:  Head: Normocephalic.  Eyes: Pupils are equal, round, and reactive to light.  Neck: No JVD present. No tracheal deviation present. No thyromegaly present.  Cardiovascular: Normal rate and regular rhythm.   Pulses:      Radial pulses are 2+ on the right side, and 2+ on the left side.       Dorsalis pedis pulses are 1+ on the right side, and 1+ on the left side.  2+ BLE edema  Pulmonary/Chest: Effort normal and breath sounds normal. No respiratory distress. She exhibits no tenderness.  Lymphadenopathy:    She has no cervical adenopathy.    ASSESSMENT/PLAN  Edema d/t CHF exacerbation: Increase Torsemide to 40 mg bid for 3 days, obtain daily weights, monitor for CV/respiratory distress and schedule appt with cardiologist, Dr. Jacinto Halim. Hypertension: patient is currently stable on treatment regimen, please continue. Reinforce low salt (NAS) diet and decrease intake of refined/processed foods.  Follow up: Monday, Sept. 22, 2014

## 2013-03-10 ENCOUNTER — Other Ambulatory Visit: Payer: Self-pay | Admitting: Vascular Surgery

## 2013-03-10 DIAGNOSIS — I70219 Atherosclerosis of native arteries of extremities with intermittent claudication, unspecified extremity: Secondary | ICD-10-CM

## 2013-03-11 ENCOUNTER — Encounter: Payer: Self-pay | Admitting: Vascular Surgery

## 2013-03-27 ENCOUNTER — Other Ambulatory Visit: Payer: Self-pay | Admitting: *Deleted

## 2013-03-27 MED ORDER — OXYCODONE HCL 5 MG PO TABS: ORAL_TABLET | ORAL | Status: DC

## 2013-04-04 ENCOUNTER — Other Ambulatory Visit (HOSPITAL_COMMUNITY): Payer: Commercial Managed Care - PPO

## 2013-04-04 ENCOUNTER — Encounter: Payer: Commercial Managed Care - PPO | Admitting: Vascular Surgery

## 2013-04-23 ENCOUNTER — Other Ambulatory Visit: Payer: Self-pay | Admitting: Sports Medicine

## 2013-04-23 DIAGNOSIS — M25511 Pain in right shoulder: Secondary | ICD-10-CM

## 2013-04-23 DIAGNOSIS — M25512 Pain in left shoulder: Principal | ICD-10-CM

## 2013-04-24 ENCOUNTER — Encounter: Payer: Self-pay | Admitting: Vascular Surgery

## 2013-04-25 ENCOUNTER — Other Ambulatory Visit (HOSPITAL_COMMUNITY): Payer: Commercial Managed Care - PPO

## 2013-04-25 ENCOUNTER — Encounter: Payer: Commercial Managed Care - PPO | Admitting: Vascular Surgery

## 2013-04-25 ENCOUNTER — Inpatient Hospital Stay (HOSPITAL_COMMUNITY): Admission: RE | Admit: 2013-04-25 | Payer: Commercial Managed Care - PPO | Source: Ambulatory Visit

## 2013-04-26 ENCOUNTER — Non-Acute Institutional Stay (SKILLED_NURSING_FACILITY): Payer: Medicare Other | Admitting: Internal Medicine

## 2013-04-26 DIAGNOSIS — R55 Syncope and collapse: Secondary | ICD-10-CM

## 2013-04-29 ENCOUNTER — Encounter: Payer: Self-pay | Admitting: *Deleted

## 2013-05-02 ENCOUNTER — Other Ambulatory Visit (HOSPITAL_COMMUNITY): Payer: Self-pay | Admitting: Cardiology

## 2013-05-02 DIAGNOSIS — I429 Cardiomyopathy, unspecified: Secondary | ICD-10-CM

## 2013-05-12 ENCOUNTER — Ambulatory Visit (HOSPITAL_COMMUNITY): Payer: Medicare Other

## 2013-05-12 NOTE — Progress Notes (Addendum)
[name], female   DOB: 1922-08-15, 79 y.o.   MRN: [id number]                PROGRESS NOTE  DATE:  04/25/2013    FACILITY: [facility]    LEVEL OF CARE:   SNF   Acute Visit   CHIEF COMPLAINT:   Review of syncopal spell.    HISTORY OF PRESENT ILLNESS:  This is a patient who has been in the facility, having come from another nursing home in August 2013.    She has a history of congestive heart failure, atrial fibrillation, and hypertension.    She  apparently was having a bowel movement this morning.  She had a moderate soft bowel movement and then became syncopal, lasting a minute.  Her vital signs showed a temperature of 96.1, pulse 47, respirations 22, and blood pressure 137/57 at the time.    LABORATORY DATA:   Last lab work on the chart showed a white count of 11.4, hemoglobin 11.5, BUN 12, creatinine 1.4, and a BNP of 90.3.  This was in September 2014.    CURRENT MEDICATIONS:    Lasix 40 mg q.d.    Toprol XL 12.5 every morning.    Vitamin D3, 2000 U every morning.    Flonase 2 sprays in each nostril once a day.     Xarelto 15 mg per day.    Lisinopril 2.5 q.d.    Amiodarone 200 q.d.    REVIEW OF SYSTEMS:   GENERAL:  The patient states she feels slightly weak but is not in any discomfort.   CHEST/RESPIRATORY:   No cough.  No sputum.   CARDIAC:   No chest pain.   GI:  No nausea, vomiting or diarrhea.  No abdominal pain.   GU:  No dysuria.    PHYSICAL EXAMINATION:   VITAL SIGNS:   O2 SATURATIONS:  97% on room air.   RESPIRATIONS:  18 and unlabored.   PULSE:  51.   CHEST/RESPIRATORY:  Clear air entry bilaterally.    CARDIOVASCULAR:  CARDIAC:   Heart sounds are bradycardic.  There are no murmurs.  No carotid bruits.  She appears to be euvolemic.   GASTROINTESTINAL:  LIVER/SPLEEN/KIDNEYS:  No liver, no spleen.  No tenderness.   GENITOURINARY:  BLADDER:   No suprapubic or costovertebral angle tenderness.   CIRCULATION:   EDEMA/VARICOSITIES:   Extremities:  There is no evidence of a DVT.     ASSESSMENT/PLAN:  Syncopal spell. This could be related to the strain of toileting.  However, I am a bit concerned about the bradycardia.  She is on amiodarone and a beta blocker as well as a significant dose of diuretic.  I am going to check lab work on her, including an EKG, and I will review this when I am in the building next week.

## 2013-05-19 ENCOUNTER — Ambulatory Visit (HOSPITAL_COMMUNITY)
Admission: RE | Admit: 2013-05-19 | Discharge: 2013-05-19 | Disposition: A | Discharge status: Home / Self Care | Payer: Medicare Other | Source: Ambulatory Visit | Attending: Cardiology | Admitting: Cardiology

## 2013-05-19 DIAGNOSIS — I079 Rheumatic tricuspid valve disease, unspecified: Secondary | ICD-10-CM | POA: Insufficient documentation

## 2013-05-19 DIAGNOSIS — M129 Arthropathy, unspecified: Secondary | ICD-10-CM | POA: Insufficient documentation

## 2013-05-19 DIAGNOSIS — I429 Cardiomyopathy, unspecified: Secondary | ICD-10-CM

## 2013-05-19 DIAGNOSIS — I509 Heart failure, unspecified: Secondary | ICD-10-CM | POA: Insufficient documentation

## 2013-05-19 DIAGNOSIS — I4891 Unspecified atrial fibrillation: Secondary | ICD-10-CM | POA: Insufficient documentation

## 2013-05-19 DIAGNOSIS — I379 Nonrheumatic pulmonary valve disorder, unspecified: Secondary | ICD-10-CM | POA: Insufficient documentation

## 2013-05-19 DIAGNOSIS — I059 Rheumatic mitral valve disease, unspecified: Secondary | ICD-10-CM | POA: Insufficient documentation

## 2013-05-19 DIAGNOSIS — I10 Essential (primary) hypertension: Secondary | ICD-10-CM | POA: Insufficient documentation

## 2013-05-19 DIAGNOSIS — I428 Other cardiomyopathies: Secondary | ICD-10-CM | POA: Insufficient documentation

## 2013-05-19 DIAGNOSIS — K5900 Constipation, unspecified: Secondary | ICD-10-CM | POA: Insufficient documentation

## 2013-05-19 NOTE — Progress Notes (Signed)
  Echocardiogram 2D Echocardiogram has been performed.  Kara Wilkerson 05/19/2013, 2:10 PM

## 2013-05-22 ENCOUNTER — Encounter: Payer: Self-pay | Admitting: Vascular Surgery

## 2013-05-23 ENCOUNTER — Encounter: Payer: Commercial Managed Care - PPO | Admitting: Vascular Surgery

## 2013-05-23 ENCOUNTER — Other Ambulatory Visit (HOSPITAL_COMMUNITY): Payer: Commercial Managed Care - PPO

## 2013-06-13 ENCOUNTER — Other Ambulatory Visit: Payer: Self-pay | Admitting: Sports Medicine

## 2013-06-13 DIAGNOSIS — M25519 Pain in unspecified shoulder: Secondary | ICD-10-CM

## 2013-06-20 ENCOUNTER — Ambulatory Visit
Admission: RE | Admit: 2013-06-20 | Discharge: 2013-06-20 | Disposition: A | Discharge status: Home / Self Care | Payer: Medicare Other | Source: Ambulatory Visit | Attending: Sports Medicine | Admitting: Sports Medicine

## 2013-06-20 DIAGNOSIS — M25519 Pain in unspecified shoulder: Secondary | ICD-10-CM

## 2013-06-20 MED ORDER — METHYLPREDNISOLONE ACETATE 40 MG/ML INJ SUSP (RADIOLOG: 120.0000 mg | Freq: Once | INTRAMUSCULAR | Status: DC

## 2013-06-20 MED ORDER — IOHEXOL 180 MG/ML  SOLN: 1.0000 mL | Freq: Once | INTRAMUSCULAR | Status: AC | PRN

## 2013-06-25 ENCOUNTER — Other Ambulatory Visit: Payer: Self-pay | Admitting: *Deleted

## 2013-06-25 ENCOUNTER — Ambulatory Visit
Admission: RE | Admit: 2013-06-25 | Discharge: 2013-06-25 | Disposition: A | Discharge status: Home / Self Care | Payer: Medicare Other | Source: Ambulatory Visit | Attending: Sports Medicine | Admitting: Sports Medicine

## 2013-06-25 MED ORDER — METHYLPREDNISOLONE ACETATE 40 MG/ML INJ SUSP (RADIOLOG
120.0000 mg | Freq: Once | INTRAMUSCULAR | Status: AC
  Administered 2013-06-25: 120 mg via INTRA_ARTICULAR

## 2013-06-25 MED ORDER — IOHEXOL 180 MG/ML  SOLN
1.0000 mL | Freq: Once | INTRAMUSCULAR | Status: AC | PRN
  Administered 2013-06-25: 1 mL via INTRA_ARTICULAR

## 2013-06-25 MED ORDER — OXYCODONE HCL 5 MG PO TABS: ORAL_TABLET | ORAL | Status: DC

## 2013-06-25 NOTE — Telephone Encounter (Signed)
[facility]  

## 2013-08-04 ENCOUNTER — Non-Acute Institutional Stay (SKILLED_NURSING_FACILITY): Payer: Medicare Other | Admitting: Internal Medicine

## 2013-08-04 DIAGNOSIS — M19019 Primary osteoarthritis, unspecified shoulder: Secondary | ICD-10-CM

## 2013-08-04 DIAGNOSIS — M19012 Primary osteoarthritis, left shoulder: Secondary | ICD-10-CM

## 2013-08-06 NOTE — Progress Notes (Addendum)
[name], female   DOB: 13-Mar-1923, 80 y.o.   MRN: [id number]                  PROGRESS NOTE  DATE:  08/04/2013    FACILITY: [facility]     LEVEL OF CARE:   SNF   Acute Visit   CHIEF COMPLAINT:  Review of left shoulder pain.    HISTORY OF PRESENT ILLNESS:  Kara Wilkerson tells me that she is having constant pain in her left shoulder.  I see that she went over to [facility] for bilateral shoulder injections of Depo-Medrol with 1% lidocaine into each glenohumeral joint about a month ago.  This was done under fluoroscopy.  Reviewing her records shows that she had an MRI of the left shoulder done in 2013.  This showed severe glenohumeral joint degenerative changes with extensive osteophytes and significant rotator cuff tendinopathy/tendinosis with shallow reticular surface tears.    PHYSICAL EXAMINATION:   MUSCULOSKELETAL:   EXTREMITIES:   LEFT UPPER EXTREMITY:  Left shoulder:  There is obvious enlargement of the left shoulder greater than the right.  Any attempt to move this results in severe bone-on-bone grading.  I have no doubt that this would be painful.  Although she tells me the injection caused her pain to be worse, there is no evidence of infection here.  No fluid, no redness, no warmth.    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  I really think that this is going to be an analgesic issue.  She is only on Tylenol plain p.r.n.  I will put her on Tylenol Extra-Strength 2 p.o. b.i.d.  She also has oxycodone p.r.n.  She has a Lidoderm patch on the anterior part of the shoulder.  As mentioned, I do not think there is any inflammatory component to this.

## 2013-08-13 ENCOUNTER — Non-Acute Institutional Stay (SKILLED_NURSING_FACILITY): Payer: Medicare Other | Admitting: Internal Medicine

## 2013-08-13 ENCOUNTER — Encounter: Payer: Self-pay | Admitting: Internal Medicine

## 2013-08-13 DIAGNOSIS — F3289 Other specified depressive episodes: Secondary | ICD-10-CM

## 2013-08-13 DIAGNOSIS — I10 Essential (primary) hypertension: Secondary | ICD-10-CM

## 2013-08-13 DIAGNOSIS — I509 Heart failure, unspecified: Secondary | ICD-10-CM

## 2013-08-13 DIAGNOSIS — E559 Vitamin D deficiency, unspecified: Secondary | ICD-10-CM | POA: Insufficient documentation

## 2013-08-13 DIAGNOSIS — K5900 Constipation, unspecified: Secondary | ICD-10-CM

## 2013-08-13 DIAGNOSIS — M1990 Unspecified osteoarthritis, unspecified site: Secondary | ICD-10-CM

## 2013-08-13 DIAGNOSIS — M75110 Incomplete rotator cuff tear or rupture of unspecified shoulder, not specified as traumatic: Secondary | ICD-10-CM

## 2013-08-13 DIAGNOSIS — I4891 Unspecified atrial fibrillation: Secondary | ICD-10-CM

## 2013-08-13 DIAGNOSIS — F32A Depression, unspecified: Secondary | ICD-10-CM

## 2013-08-13 DIAGNOSIS — F329 Major depressive disorder, single episode, unspecified: Secondary | ICD-10-CM

## 2013-08-13 NOTE — Progress Notes (Signed)
[name], female   DOB: 1923/02/09, 80 y.o.   MRN: [id number]   this is a routine visit.  Level of care skilled.  Facility AF. Chief complaint-medical management of chronic medical conditions including CHF-atrial fibrillation hypertension constipation  HPI: Pt is being followed for medical management of chronic illnesses..  Most acute issue recently appears to be some chronic left shoulder pain she was seen by Dr. Leanord Hawking recently been started on Tylenol extra strength 2 tabs twice a day-this appears to be helping-she is followed by orthopedics and has received steroid injections in the past--per chart review it appears she has a history of severe osteoarthritis degenerative changes of her shoulder--he is also receiving a Lidoderm patch as well as oxycodone when necessary which she takes at times.    Pt is also followed by cardiologist, Dr. Jacinto Halim, for CHF, Hypertension and Afib Per chart review I see a consult note 05/17/2013-at that point and thought it was CHF was well compensated--subsequent cardiac echo showed a left ventricular ejection fraction 20-25%-with grade 2 diastolic dysfunction--she does have some history of bradycardia although her pulse rate is in the 60s on exam today.  She continues to complain of some weakness after bowel movements with shortness of breath but this has been chronic per chart review  and what the patient tells me and not any worse than it has been for some time   .  Her only complaint today is stating that she thinks she has a cold with a sore throat yesterday but she says this is better today also a cough at times she is receiving Robitussin when necessary and apparently this is helping  . Marland Kitchen      Past Medical History   Diagnosis  Date   .  CHF (congestive heart failure)     .  A-fib     .  Hypertension     .  Constipation     .  Arthritis             Past Surgical History   Procedure  Laterality  Date   .  Right ankle orif            Current Outpatient Prescriptions on File Prior to Visit   Medication  Sig  Dispense  Refill   .  amiodarone (PACERONE) 200 MG tablet  Take 200 mg by mouth daily.         .  calcium carbonate (TUMS - DOSED IN MG ELEMENTAL CALCIUM) 500 MG chewable tablet  Chew 1 tablet by mouth 3 (three) times daily.         .  cholecalciferol (VITAMIN D) 1000 UNITS tablet  Take 2,000 Units by mouth daily.         .  diclofenac sodium (VOLTAREN) 1 % GEL  Apply 4 g topically 2 (two) times daily. To both knees         .  fluticasone (FLONASE) 50 MCG/ACT nasal spray  Place 2 sprays into the nose daily.         Marland Kitchen  glucosamine-chondroitin 500-400 MG tablet  Take 2 tablets by mouth 2 (two) times daily.         Marland Kitchen  lidocaine (LIDODERM) 5 %  Place 1 patch onto the skin daily. Remove & Discard patch within 12 hours to left shoulder         .  lisinopril (PRINIVIL,ZESTRIL) 2.5 MG tablet  Take 2.5 mg by mouth daily.         Marland Kitchen  metoprolol succinate (TOPROL-XL) 25 MG 24 hr tablet  Take 12.5 mg by mouth daily.         Marland Kitchen  oxyCODONE (OXY IR/ROXICODONE) 5 MG immediate release tablet  Take 1 tablet every 3 hours as needed for pain   240 tablet   0   .  Polyethyl Glycol-Propyl Glycol (SYSTANE) 0.4-0.3 % SOLN  Apply 1 drop to eye daily.         .  polyethylene glycol (MIRALAX / GLYCOLAX) packet  Take 17 g by mouth daily.         .  potassium chloride SA (K-DUR,KLOR-CON) 20 MEQ tablet  Take 40 mEq by mouth 2 (two) times daily.  Tylenol 1000 mg twice a day .  Rivaroxaban (XARELTO) 15 MG TABS tablet  Take 15 mg by mouth daily.         .  sennosides-docusate sodium (SENOKOT-S) 8.6-50 MG tablet  Take 2 tablets by mouth daily.         Marland Kitchen  torsemide (DEMADEX) 20 MG tablet  Take 20 mg by mouth 2 (two) times daily Zoloft 100 mg daily.                                                         SIGNIFICANT DIAGNOSTIC EXAMS  07/22/2013-ABI bilateral.  Right ABI greater than 1.2 indicative of arterial wall  calcification-normal left ABI  February 2015-cardiac echo showed left ventricular ejection fraction 20-25%-also grade 2 diastolic dysfunction.    07-18-12: chest x-ray: no acute abnormality present     09-24-12: 2-d echo: - Left ventricle: The cavity size was moderately dilated.There was mild concentric hypertrophy. Systolic function was severely reduced. The estimated ejection fraction wasin the range of 20% to 25%. Diffuse hypokinesis.-- Mitral valve: Moderate regurgitation. - Left atrium: The atrium was mildly dilated  03/26/2012.  MRI left shoulder-severe glenohumeral joint degenerative changes with extensive osteophyte spurring.  Significant rotator cuff tendinopathy tendinosis with shallow articular surface tears.  Intact biceps tendon.  De generated and likely torn glenoid labrum  .                LABS REVIEWED  April 28th 2015.  WBC 8.9 hemoglobin 9.0 platelets 346  07/31/2013-hemoglobin A1c 5.5.  07/15/2013.  Sodium 135 potassium 4.6 BUN 43 creatinine 1.7.  Liver function tests within normal limits except albumin of 3.0.  04/25/2013.  Digoxin level-0.3.  Jan 20th 2015.  TSH-2.51  WBC 13.6 hemoglobin 8.7 platelets 466.  Sodium 137 potassium 4.8 BUN 29 creatinine 1.5.  02/25/2013.  Cholesterol 161-HDL 41-LDL 95-triglycerides 127-         03-22-12: glucose 103; bun 28; creat 0.95; k+ 4.8; na++ 133 07-11-12: glucose 89; bun 24; creat 1.15; k+ 3.9; na++ 139   08-22-12: wbc 8.7; hgb 11.2; hct 32.4; mcv 86.9; plt 287; chol 148; ldl 90; trig 76; tsh 1.085  10-18-12 WBC 4.02, RBC-4.02, Hemoglobin 11.8, Hemocrit 34.1, MCV 84.8, MCH 29.4, MCHC 34.6,RDW-14.3,Platlet 322, Sodium 139, Potassium 4.5, Chloride 101, C02-28, Glucose-93, BUN-26, Creatinine 1.43, Calcium 9.8, TSH 2.313, Vitamin B12, Folate 19.2   Review of Systems  Constitutional: Negative.  Negative for fever and chills.  HENT: Positive for sore throat that she says is improving.     Eyes: Negative. For any visual changes she does have a history of a disconjugate  right eye   Respiratory: Is not complaining of any increased shortness of breath from baseline has occasional cough she feels this is getting better with the Robitussin.   Cardiovascular: Positive for leg swelling. Not complaining of chest pain or palpitations  Gastrointestinal: Negative. For abdominal pain nausea vomiting diarrhea constipation   Musculoskeletal: Continues to complain of left shoulder discomfort at times but says the Tylenol and oxycodone. Be helping.--Otherwise does not complai of joint pain   Skin: Negative for rash or itching.   Neurological: Negative.  Negative for weakness dizziness or headache.  Endo/Heme/Allergies: Negative.   Psychiatric/Behavioral: Some history of depression but appears to be in good spirits nursing staff has not noted significant depressive episodes            Physical Exam  Temperature 97.0 pulse 62 respirations 18 blood pressure 121/71 all these appear relatively baseline--although she does have pulses frequently in the 50s  Constitutional: She appears well-nourished. Eating comfortably in her wheelchair  HENT:   Head: Normocephalic.   Nose: Nose normal.  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  Neck: No thyromegaly present Oropharynx is clear mucous membranes moist I did not note any exudate or increased erythema.  Cardiovascular: Regular rhythm.  Pulses and 60 continues with 1-2+ lower extremity edema compression hose intact bilaterally lower extremities.   Pulmonary/Chest: Effort normal no labored breathing clear to auscultation.  Abdominal: Soft. Normal appearance and bowel sounds are normal.  Musculoskeletal:  DME-Wheelchair --limited range of motion of her left arm secondary to shoulder issues I did not note any  deformities moves all extremities x4 ambulates in a wheelchair  .  Neurological: Appears grossly intact no lateralizing findings-does have a right eye lateral deviation as noted above otherwise cranial nerves appear grossly intact.  Skin: Skin is warm and dry.        ASSESSMENT/ PLAN:     CHF (congestive heart failure) Her ef is 20-25%; will continue demadex 20 mg twice daily with k+ 40 meq twice daily; will continue 1800 cc fluid restriction; digoxin .125 mg daily; and will monitor her status. -- update metabolic panel as well as the digoxin level--her weights have been stable.   Afib   Will continue Amiodarone and Digoxin for rate control; Xarelto for anticoagulopathy this appears to be relatively stable   Essential Hypertension  At this point appear stable on beta blocker as well as lisinopril recent blood pressures 120/71-140/80-112/53     Constipation Will continue medications this appears to be stable no complaints constipation from nursing staff or patient   Arthritis Pt is stable he is on numerous medications including Lidoderm patch Tylenol thousand milligrams twice a day oxycodone when necessary every 3 hours 5 mg-continues at times to be an issue challenging situation--at this point appears relatively stable   Depression-this appears stable on Zoloft.  Vitamin D deficiency she is on supplementation update vitamin D level Next lab day as well  Anemia-likely chronic disease hemoglobin has been stable somewhat improved compared to January labs will update with iron studies  Cold symptoms-physical exam was quite benign-she is receiving relief with Robitussin-she feels this is doing better compared to yesterday -- monitor vital signs pulse ox x48 hours and Robitussin routine every 6 hours for 48 hours and then when necessary  [id number]-of note greater than 40 minutes spent assessing patient- discussing her status with nursing staff-review of her medical  records-and coordinating and formulating a plan of care-of note greater than 50% of time spent coordinating plan of care

## 2013-09-02 ENCOUNTER — Other Ambulatory Visit: Payer: Self-pay | Admitting: *Deleted

## 2013-09-02 MED ORDER — OXYCODONE HCL 5 MG PO TABS: ORAL_TABLET | ORAL | Status: DC

## 2013-09-02 NOTE — Telephone Encounter (Signed)
[facility] 

## 2013-09-03 ENCOUNTER — Non-Acute Institutional Stay (SKILLED_NURSING_FACILITY): Payer: Medicare Other | Admitting: Internal Medicine

## 2013-09-03 DIAGNOSIS — R609 Edema, unspecified: Secondary | ICD-10-CM

## 2013-09-03 DIAGNOSIS — D649 Anemia, unspecified: Secondary | ICD-10-CM

## 2013-09-03 DIAGNOSIS — I509 Heart failure, unspecified: Secondary | ICD-10-CM

## 2013-09-03 NOTE — Progress Notes (Signed)
[name], female   DOB: 1922/10/30, 80 y.o.   MRN: [id number]   This is an acute visit.  Level of care skilled.  Facility AF  Chief complaint-acute visit secondary to anemia-left leg edema.  History of present illness.  Patient is a pleasant 80 year old female who has been quite stable recently-routine lab work did show consistent anemia with a hemoglobin of 8.8 which has been her baseline for some time-iron studies were ordered which showed a low iron of 28 total iron binding capacity of 283 ferritin 31 folic acid and B12 were adequate.  She does not complaining of any increased weakness or dizziness does not appear to be overtly symptomatic.  She also has a history of congestive heart failure with an ejection fraction of 20-25% on most recent echo-also grade 2 diastolic dysfunction  She is on Demadex 20 mg twice a day with potassium supplementation 40 mEq twice a day.  She also has a history of atrial fibrillation   Nursing staff has noted some edema of her left leg she does not complaining of any increased pain here or shortness of breath.  Family medical social history has been reviewed most recently progress note on 08/14/2013.  Medications have been reviewed per MAR.  Review of systems.  In general she denies any fever or chills.  Skin does not complaining of itching or rashes.  Respiratory no complaints of shortness of breath or cough.  Cardiac no chest pain does have a history CHF and atrial fibrillation denies any palpitations.  GI does not complaining of abdominal discomfort nausea or vomiting.  Muscle skeletal does not complaining of joint pain today.  Neurologic does not complaining of dizziness headache or syncopal-type feelings.   physical exam.  Temperatures 98.2 pulse 61 respirations 18 blood pressure 128/72 weight 166 apparently this is stable.  In general this is a pleasant elderly resident in no distress sitting comfortably in her  wheelchair.  Her skin is warm and dry.  Eyes she does have a disconjugate right eye this is baseline she has prescription lenses visual acuity appears at baseline.  Oropharynx is clear mucous membranes are moist.  Chest is clear to auscultation no labored breathing.  Heart is regular rate and rhythm without murmur gallop or rub she has one-2+ edema of her left leg and 1+ right leg--somewhat reduced pedal pulses  Abdomen is soft nontender positive bowel sounds.  Muscle skeletal moves all extremities at baseline with some lower extremity weakness which is not new.  Neurologic is grossly intact no lateralizing findings.   Labs.  08/14/2013.  WBC 5.2 hemoglobin 8.8 platelets 342.  Iron 28-total iron binding capacity 283-ferritin 31-folic acid 21.8-T12 1384.  TSH 1.897.  Digoxin 0.1.  Vitamin D-31  Assessment and plan  .  #1-anemia I suspect there is an element of chronic disease her--t iron levels are low with somewhat low ferritin Will treat with iron once a day and monitor with a CBC in 2 weeks.  #2-left leg edema somewhat increased from baseline-will order a venous Doppler to rule out DVT-also will order a BNP tomorrow as well as BMP-monitor weights 2 times a week notify provider gain greater than 3 pounds-this is complicated with her history of CHF would like to monitor this  [id number]

## 2013-10-08 ENCOUNTER — Non-Acute Institutional Stay (SKILLED_NURSING_FACILITY): Payer: Medicare Other | Admitting: Internal Medicine

## 2013-10-08 DIAGNOSIS — F329 Major depressive disorder, single episode, unspecified: Secondary | ICD-10-CM

## 2013-10-08 DIAGNOSIS — F32A Depression, unspecified: Secondary | ICD-10-CM

## 2013-10-08 DIAGNOSIS — I4891 Unspecified atrial fibrillation: Secondary | ICD-10-CM

## 2013-10-08 DIAGNOSIS — M159 Polyosteoarthritis, unspecified: Secondary | ICD-10-CM

## 2013-10-08 DIAGNOSIS — R609 Edema, unspecified: Secondary | ICD-10-CM

## 2013-10-08 DIAGNOSIS — F3289 Other specified depressive episodes: Secondary | ICD-10-CM

## 2013-10-08 DIAGNOSIS — I509 Heart failure, unspecified: Secondary | ICD-10-CM

## 2013-10-08 DIAGNOSIS — D649 Anemia, unspecified: Secondary | ICD-10-CM

## 2013-10-08 DIAGNOSIS — M150 Primary generalized (osteo)arthritis: Secondary | ICD-10-CM

## 2013-10-08 NOTE — Progress Notes (Signed)
[name], female   DOB: 04-22-1922, 80 y.o.   MRN: [id number]   this is a routine visit.  Level of care skilled.  Facility AF .  Chief complaint-medical management of chronic medical conditions including CHF-atrial fibrillation hypertension constipation   HPI:  Pt is being followed for medical management of chronic illnesses She have been relatively stable-however she remains concerned about her lower extremity edema.  A venous Doppler was done apparently of her left lower extremity which did not show any clot-she remains concerned however and we will orderDoppler of both lower extremities to reassure She is onXaralto with hx afib ... She also has..  some chronic left shoulder pain she was seen by Dr. Leanord Hawkingobson-- been started on Tylenol extra strength 2 tabs twice a day-this appears to be helping-she is followed by orthopedics and has received steroid injections in the past--per chart review it appears she has a history of severe osteoarthritis degenerative changes of her shoulder-- is also receiving a Lidoderm patch as well as oxycodone when necessary which she takes at times.  Pt is also followed by cardiologist, Dr. Jacinto HalimGanji, for CHF, Hypertension and Afib  Per chart review I saw a consult note 05/17/2013-at that point and thought it was CHF was well compensated--subsequent cardiac echo showed a left ventricular ejection fraction 20-25%-with grade 2 diastolic dysfunction--she does have some history of bradycardia although her pulse rate is in the 60s on exam today  She is on Lopressor for rate control with history of A. fib as well as digoxin.  She recently was started on iron for some suspicion of mild iron deficiency anemia--it appears her anemia has been quite chronic but actually most recent lab shows higher end of baseline at 9.2  which is up from 8.8 about 2 months ago .    . .  Past Medical History  Diagnosis Date  . CHF (congestive heart failure)  . A-fib  .  Hypertension  . Constipation  . Arthritis  Past Surgical History  Procedure Laterality Date  . Right ankle orif  t  Medications . amiodarone (PACERONE) 200 MG tablet Take 200 mg by mouth daily.  . calcium carbonate (TUMS - DOSED IN MG ELEMENTAL CALCIUM) 500 MG chewable tablet Chew 1 tablet by mouth 3 (three) times daily.  . cholecalciferol (VITAMIN D) 1000 UNITS tablet Take 2,000 Units by mouth daily.  . diclofenac sodium (VOLTAREN) 1 % GEL Apply 4 g topically 2 (two) times daily. To both knees  . fluticasone (FLONASE) 50 MCG/ACT nasal spray Place 2 sprays into the nose daily.  Marland Kitchen. glucosamine-chondroitin 500-400 MG tablet Take 2 tablets by mouth 2 (two) times daily.  Marland Kitchen. lidocaine (LIDODERM) 5 % Place 1 patch onto the skin daily. Remove & Discard patch within 12 hours to left shoulder  . lisinopril (PRINIVIL,ZESTRIL) 2.5 MG tablet Take 2.5 mg by mouth daily.  . metoprolol succinate (TOPROL-XL) 25 MG 24 hr tablet Take 12.5 mg by mouth daily.  Marland Kitchen. oxyCODONE (OXY IR/ROXICODONE) 5 MG immediate release tablet Take 1 tablet every 3 hours as needed for pain 240 tablet 0  . Polyethyl Glycol-Propyl Glycol (SYSTANE) 0.4-0.3 % SOLN Apply 1 drop to eye daily.  . polyethylene glycol (MIRALAX / GLYCOLAX) packet Take 17 g by mouth daily.  . potassium chloride SA (K-DUR,KLOR-CON) 20 MEQ tablet Take 40 mEq by mouth 2 (two) times daily.  Tylenol 1000 mg twice a day  . Rivaroxaban (XARELTO) 15 MG TABS tablet Take 15 mg by mouth daily.  .Marland Kitchen  sennosides-docusate sodium (SENOKOT-S) 8.6-50 MG tablet Take 2 tablets by mouth daily.  Marland Kitchen torsemide (DEMADEX) 20 MG tablet Take 20 mg by mouth 2 (two) times daily  Zoloft 100 mg daily .  SIGNIFICANT DIAGNOSTIC EXAMS  07/22/2013-ABI bilateral.  Right ABI greater than 1.2 indicative of arterial wall calcification-normal left ABI  February 2015-cardiac echo showed left ventricular ejection fraction 20-25%-also grade 2 diastolic dysfunction.  07-18-12: chest x-ray: no acute  abnormality present  09-24-12: 2-d echo: - Left ventricle: The cavity size was moderately dilated.There was mild concentric hypertrophy. Systolic function was severely reduced. The estimated ejection fraction wasin the range of 20% to 25%. Diffuse hypokinesis.-- Mitral valve: Moderate regurgitation. - Left atrium: The atrium was mildly dilated  03/26/2012.  MRI left shoulder-severe glenohumeral joint degenerative changes with extensive osteophyte spurring.  Significant rotator cuff tendinopathy tendinosis with shallow articular surface tears.  Intact biceps tendon.  De generated and likely torn glenoid labrum  .  LABS REVIEWED 09/18/2013.  WBC 8.7 hemoglobin 9.2 platelets 296.  08/14/2013.  WBC 5.2 hemoglobin 8.8 platelets 346.  09/04/2013.  Sodium 137 potassium 4.2 BUN 20 creatinine 1.4.  08/14/2013.  Vitamin B12 1384-.  Digoxin 0.1.  TSH-1.897.  Vitamin D level  31    April 28th 2015.  WBC 8.9 hemoglobin 9.0 platelets 346  07/31/2013-hemoglobin A1c 5.5.  07/15/2013.  Sodium 135 potassium 4.6 BUN 43 creatinine 1.7.  Liver function tests within normal limits except albumin of 3.0.  04/25/2013.  Digoxin level-0.3.  Jan 20th 2015.  TSH-2.51  WBC 13.6 hemoglobin 8.7 platelets 466.  Sodium 137 potassium 4.8 BUN 29 creatinine 1.5.  02/25/2013.  Cholesterol 161-HDL 41-LDL 95-triglycerides 127-  03-22-12: glucose 103; bun 28; creat 0.95; k+ 4.8; na++ 133  07-11-12: glucose 89; bun 24; creat 1.15; k+ 3.9; na++ 139  08-22-12: wbc 8.7; hgb 11.2; hct 32.4; mcv 86.9; plt 287; chol 148; ldl 90; trig 76; tsh 1.085  10-18-12 WBC 4.02, RBC-4.02, Hemoglobin 11.8, Hemocrit 34.1, MCV 84.8, MCH 29.4, MCHC 34.6,RDW-14.3,Platlet 322, Sodium 139, Potassium 4.5, Chloride 101, C02-28, Glucose-93, BUN-26, Creatinine 1.43, Calcium 9.8, TSH 2.313, Vitamin B12, Folate 19.2   Review of Systems  Constitutional: Negative. Negative for fever and chills.  HENT: Is not complaining of sore throat or  hearing difficulties this evening.  Eyes: Negative. For any visual changes she does have a history of a disconjugate right eye  Respiratory: Is not complaining of any increased shortness of breath from baseline has occasional cough .  Cardiovascular: Positive for leg swelling. Not complaining of chest pain or palpitations  Gastrointestinal: Negative. For abdominal pain nausea vomiting diarrhea constipation  Musculoskeletal: Continues to complain of left shoulder discomfort at times but says the Tylenol and oxycodone.e helping.--Otherwise does not complai of joint pain  Skin: Negative for rash or itching.  Neurological: Negative. Negative for weakness dizziness or headache.  Endo/Heme/Allergies: Negative.  Psychiatric/Behavioral: Some history of depression but appears to be in good spirits nursing staff has not noted significant depressive episodes   Physical Exam   She is afebrile pulse 62 respirations of 19 blood pressure 138/71-127/70 most recently pulses appear to run in the high 50s low 60s generally --weight is stable at 169.4 this appears to be comparable  with her weights the past several months Constitutional: She appears well-nourished. sitting comfortably in her wheelchair  HENT:  Head: Normocephalic.  Nose: Nose normal.  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  Neck: No thyromegaly present  Oropharynx is clear mucous membranes moist I did not note any exudate or increased erythema.  Cardiovascular: Regular rhythm. Pulse isd 60 continues with 1-2+ lower extremity edema--appears relatively baseline was slightly reduced pedal pulses are not erythematous or acutely tender compression hose intact bilaterally lower extremities.  Pulmonary/Chest: Effort normal no labored breathing clear to auscultation.  Abdominal: Soft. Normal appearance and bowel sounds  are normal.  Musculoskeletal:  DME-Wheelchair --limited range of motion of her left arm secondary to shoulder issues I did not note any deformities moves all extremities x4 ambulates in a wheelchair  .  Neurological: Appears grossly intact no lateralizing findings-does have a right eye lateral deviation as noted above otherwise cranial nerves appear grossly intact.  Skin: Skin is warm and dry   .  ASSESSMENT/ PLAN:  CHF (congestive heart failure)  Her ef is 20-25%; will continue demadex 20 mg twice daily with k+ 40 meq twice daily; will continue 1800 cc fluid restriction; digoxin .125 mg daily; and will monitor her status. -- update metabolic panel as well as the digoxin level--her weights have been stable--also will update BNP recent one was not remarkable at 210 last May .  Afib  Will continue Amiodarone and Digoxin for rate control; Xarelto for anticoagulopathy this appears to be relatively stable --also is on Lopressor--since she is on amiodarone we'll check a TSH also will check a digoxin level although I suspect this will be low since there is an  order to hold this for pulse less than 60 Essential Hypertension  At this point appear stable on beta blocker as well as lisinopril recent blood pressures [employer] as noted above Constipation  Will continue medications this appears to be stable no complaints constipation from nursing staff or patient  Arthritis  Pt is stable he is on numerous medications including Lidoderm patch Tylenol thousand milligrams twice a day oxycodone when necessary every 3 hours 5 mg-continues at times to be an issue challenging situation--at this point appears relatively stable  Depression-this appears stable on Zoloft.   Vitamin D deficiency she is on supplementation     Anemia-likely chronic disease--with recent study showing possibly an element of  iron deficiency she has been started on iron will update CBC    Bilateral lower extremity edema-I have  encouraged leg elevation-her weights have been stable per chart review for some time at this point Will order venous Dopplers for reassurance clinically she appears stable she is again on Xaralto  for anticoagulation with history of A. Fib  [id number]

## 2013-11-24 ENCOUNTER — Other Ambulatory Visit: Payer: Self-pay

## 2013-11-24 MED ORDER — OXYCODONE HCL 5 MG PO TABS: ORAL_TABLET | ORAL | Status: DC

## 2013-11-24 NOTE — Telephone Encounter (Signed)
RX sent to [facility] @ [phone]. Phone number [phone]  

## 2013-12-29 ENCOUNTER — Non-Acute Institutional Stay (SKILLED_NURSING_FACILITY): Payer: Medicare Other | Admitting: Internal Medicine

## 2013-12-29 ENCOUNTER — Encounter: Payer: Self-pay | Admitting: Internal Medicine

## 2013-12-29 DIAGNOSIS — D508 Other iron deficiency anemias: Secondary | ICD-10-CM

## 2013-12-29 DIAGNOSIS — F32A Depression, unspecified: Secondary | ICD-10-CM

## 2013-12-29 DIAGNOSIS — I482 Chronic atrial fibrillation, unspecified: Secondary | ICD-10-CM

## 2013-12-29 DIAGNOSIS — I509 Heart failure, unspecified: Secondary | ICD-10-CM

## 2013-12-29 DIAGNOSIS — R42 Dizziness and giddiness: Secondary | ICD-10-CM

## 2013-12-29 DIAGNOSIS — F3289 Other specified depressive episodes: Secondary | ICD-10-CM

## 2013-12-29 DIAGNOSIS — I4891 Unspecified atrial fibrillation: Secondary | ICD-10-CM

## 2013-12-29 DIAGNOSIS — F329 Major depressive disorder, single episode, unspecified: Secondary | ICD-10-CM

## 2013-12-29 DIAGNOSIS — I10 Essential (primary) hypertension: Secondary | ICD-10-CM

## 2013-12-29 NOTE — Progress Notes (Signed)
Patient ID: [name], female   DOB: 1922-09-17, 80 y.o.   MRN: [id number]   this is a routine visit.  Level of care skilled.  Facility AF  .  Chief complaint-medical management of chronic medical conditions including CHF-atrial fibrillation hypertension constipation   HPI:  Pt is being followed for medical management of chronic illnesses  She have been relatively stable--she says she does have some dizziness initially when she gets up first thing in the morning and also occasionally at night after she goes to bed-she says there is no associated chest pain or shortness of breath  says occasionally she will have a headache as well. She says this is been going on for a few weeks-- however she states it did not occur this morning  He does have a history of atrial fibrillation this appears to be rate controlled I see occasionally pulses in the 50s but she has been relatively asymptomatic-she is on low-dose metoprolol as well as digoxin   She is onXaraltofor anticoagulation  ...  She also has.. some chronic left shoulder pain she was seen by Dr. Leanord Hawking-- been started on Tylenol extra strength 2 tabs twice a day-this appears to be helping-she is followed by orthopedics and has received steroid injections in the past--per chart review it appears she has a history of severe osteoarthritis degenerative changes of her shoulder-- is also receiving a Lidoderm patch as well as oxycodone when necessary which she takes at times .  Pt is also followed by cardiologist, Dr. Jacinto Halim, for CHF, Hypertension and Afib  Per chart review I saw a consult note on 10/29/2013 her she was thought to be stable with her congestive heart failure cardiologist did reduce her digoxin to 5 days a week a fairly recent cardiac echo showed a left ventricular ejection fraction 20-25%-with grade 2 diastolic dysfunction--she does have some history of bradycardia her pulse rate is in the higher 50's  on exam today .  She recently was  started on iron for some suspicion of mild iron deficiency anemia--it appears her anemia has been quite chronic but actually most recent lab shows higher end of baseline at 10 which is up from 8.8 back in May  .  . .  Past Medical History  Diagnosis Date  . CHF (congestive heart failure)  . A-fib  . Hypertension  . Constipation  . Arthritis  Past Surgical History  Procedure Laterality Date  . Right ankle orif  t  Medications  . amiodarone (PACERONE) 200 MG tablet Take 200 mg by mouth daily.  . calcium carbonate (TUMS - DOSED IN MG ELEMENTAL CALCIUM) 500 MG chewable tablet Chew 1 tablet by mouth 3 (three) times daily.  . cholecalciferol (VITAMIN D) 1000 UNITS tablet Take 2,000 Units by mouth daily.  . diclofenac sodium (VOLTAREN) 1 % GEL Apply 4 g topically 2 (two) times daily. To both knees  . fluticasone (FLONASE) 50 MCG/ACT nasal spray Place 2 sprays into the nose daily.  Marland Kitchen glucosamine-chondroitin 500-400 MG tablet Take 2 tablets by mouth 2 (two) times daily.  Marland Kitchen lidocaine (LIDODERM) 5 % Place 1 patch onto the skin daily. Remove & Discard patch within 12 hours to left shoulder  . lisinopril (PRINIVIL,ZESTRIL) 2.5 MG tablet Take 2.5 mg by mouth daily.  . metoprolol succinate (TOPROL-XL) 25 MG 24 hr tablet Take 12.5 mg by mouth daily.  Marland Kitchen oxyCODONE (OXY IR/ROXICODONE) 5 MG immediate release tablet Take 1 tablet every 3 hours as needed for pain 240 tablet 0  .  Polyethyl Glycol-Propyl Glycol (SYSTANE) 0.4-0.3 % SOLN Apply 1 drop to eye daily.  . polyethylene glycol (MIRALAX / GLYCOLAX) packet Take 17 g by mouth daily.  . potassium chloride SA (K-DUR,KLOR-CON) 20 MEQ tablet Take 40 mEq by mouth 2 (two) times daily.  Tylenol 1000 mg twice a day  . Rivaroxaban (XARELTO) 15 MG TABS tablet Take 15 mg by mouth daily.  . sennosides-docusate sodium (SENOKOT-S) 8.6-50 MG tablet Take 2 tablets by mouth daily.  Marland Kitchen torsemide (DEMADEX) 20 MG tablet Take 20 mg by mouth 2 (two) times daily  Zoloft 100 mg  daily  .  SIGNIFICANT DIAGNOSTIC EXAMS  07/22/2013-ABI bilateral.  Right ABI greater than 1.2 indicative of arterial wall calcification-normal left ABI  February 2015-cardiac echo showed left ventricular ejection fraction 20-25%-also grade 2 diastolic dysfunction.  07-18-12: chest x-ray: no acute abnormality present  09-24-12: 2-d echo: - Left ventricle: The cavity size was moderately dilated.There was mild concentric hypertrophy. Systolic function was severely reduced. The estimated ejection fraction wasin the range of 20% to 25%. Diffuse hypokinesis.-- Mitral valve: Moderate regurgitation. - Left atrium: The atrium was mildly dilated  03/26/2012.  MRI left shoulder-severe glenohumeral joint degenerative changes with extensive osteophyte spurring.  Significant rotator cuff tendinopathy tendinosis with shallow articular surface tears.  Intact biceps tendon.  De generated and likely torn glenoid labrum  .  LABS REVIEWED 12/02/2013.  WBC 8.3-hemoglobin 10.0 last platelets 290.  Sodium 140-potassium 3.8-BUN 18-creatinine 1.3.  Liver function tests within normal limits except albumin of 3.4.  10/30/2013.  TSH normal at 1.50.    09/18/2013.  WBC 8.7 hemoglobin 9.2 platelets 296.  08/14/2013.  WBC 5.2 hemoglobin 8.8 platelets 346.  09/04/2013.  Sodium 137 potassium 4.2 BUN 20 creatinine 1.4.  08/14/2013.  Vitamin B12 1384-.  Digoxin 0.1.  TSH-1.897.  Vitamin D level 31  April 28th 2015.  WBC 8.9 hemoglobin 9.0 platelets 346  07/31/2013-hemoglobin A1c 5.5.  07/15/2013.  Sodium 135 potassium 4.6 BUN 43 creatinine 1.7.  Liver function tests within normal limits except albumin of 3.0.  04/25/2013.  Digoxin level-0.3.  Jan 20th 2015.  TSH-2.51  WBC 13.6 hemoglobin 8.7 platelets 466.  Sodium 137 potassium 4.8 BUN 29 creatinine 1.5.  02/25/2013.  Cholesterol 161-HDL 41-LDL 95-triglycerides 127-  03-22-12: glucose 103; bun 28; creat 0.95; k+ 4.8; na++ 133  07-11-12: glucose 89;  bun 24; creat 1.15; k+ 3.9; na++ 139  08-22-12: wbc 8.7; hgb 11.2; hct 32.4; mcv 86.9; plt 287; chol 148; ldl 90; trig 76; tsh 1.085  10-18-12 WBC 4.02, RBC-4.02, Hemoglobin 11.8, Hemocrit 34.1, MCV 84.8, MCH 29.4, MCHC 34.6,RDW-14.3,Platlet 322, Sodium 139, Potassium 4.5, Chloride 101, C02-28, Glucose-93, BUN-26, Creatinine 1.43, Calcium 9.8, TSH 2.313, Vitamin B12, Folate 19.2   Review of Systems  Constitutional: Negative. Negative for fever and chills.  HENT: Is not complaining of sore throat or hearing difficulties this evening --feels at  times  she does have wax in her ears  Eyes: Negative. For any visual changes she does have a history of a disconjugate right eye  Respiratory: Is not complaining of any increased shortness of breath from baseline has occasional cough .  Cardiovascular: Positive for leg swelling but at baseline. Not complaining of chest pain or palpitations  Gastrointestinal: Negative. For abdominal pain nausea vomiting diarrhea constipation  Musculoskeletal: Continues to complain of left shoulder discomfort at times but the Tylenol and oxycodone.e helping.--Otherwise does not complai of joint pain  Skin: Negative for rash or itching.  Neurological: Negative. Negative for weakness --  says she has dizziness of short duration in the morning after she gets up and also occasionally at night after she lies down she says this does not last more than a few minutes.  Endo/Heme/Allergies: Negative.  Psychiatric/Behavioral: Some history of depression but appears to be in good spirits nursing staff has not noted significant depressive episodes   Physical Exam   Temperature 97.6 pulse 56 respirations 18 blood pressure 120/62-blood pressures appear red systolically 120s-one 40s recently pulses from the 70s-50s  Constitutional: She appears well-nourished. sitting comfortably in her wheelchair  HENT:  Head: Normocephalic.  Nose: Nose normal.  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  Neck: No thyromegaly present  Oropharynx is clear-- mucous membranes moist I did not note any exudate or increased erythema.  Cardiovascular: Regular rhythm. Pulse is 56 continues with 1-2+ lower extremity edema--appears relatively baseline was slightly reduced pedal pulses are not erythematous or acutely tender compression hose intact bilaterally lower extremities.  Pulmonary/Chest: Effort normal no labored breathing clear to auscultation.  Abdominal: Soft. Normal appearance and bowel sounds are normal.  Musculoskeletal:  DME-Wheelchair --limited range of motion of her left arm secondary to shoulder issues I did not note any deformities moves all extremities x4 ambulates in a wheelchair  .  Neurological: Appears grossly intact no lateralizing findings-does have a right eye lateral deviation as noted above otherwise cranial nerves appear grossly intact. -Her speech is clear-I did not note any focal deficits does have baseline lower extremity weakness especially on her left but this is not new-  Skin: Skin is warm and dry  .  ASSESSMENT/ PLAN:  CHF (congestive heart failure)  Her ef is 20-25%; will continue demadex 20 mg twice daily with k+ 40 meq twice daily; will continue 1800 cc fluid restriction; digoxin .125 mg daily except on Saturday and Sunday ; and will monitor her status. -- update metabolic panel as well as the digoxin level-  she appears to be stable  .  Afib  Will continue Amiodarone and Digoxin as well as low-dose Lopressor for rate control; Xarelto for anticoagulopathy this appears to be relatively stable ----since she is on amiodarone we'll check a TSH also will check a digoxin level --will write an order to hold digoxin and Lopressor for pulse less than 60    Essential Hypertension  At this point appear stable on beta blocker as well as  lisinopril recent blood pressures [employer] as noted above   Constipation  Will continue medications this appears to be stable no complaints constipation from nursing staff or patient   Arthritis  Pt is stable he is on numerous medications including Lidoderm patch Tylenol thousand milligrams twice a day oxycodone when necessary every 3 hours 5 mg---at this point appears relatively stable   Depression-this appears stable on Zoloft .  Vitamin D deficiency she is on supplementation   Anemia-likely chronic disease--with recent study showing possibly an element of iron deficiency she has been started on iron will update CBC--hemoglobin appears to be trending up    Bilateral lower extremity edema-this appears to be at baseline she does wear compression hose.  Dizziness-apparently this is quite transitory by patient history appears to be somewhat positional related-it is not associated with any increased shortness of breath or chest pain--discussed with Dr. Lynwood Dawley phone-- low dose Antivert when necessary-also consider a cardiology appointment although this does not appear to be  especially cardiac in origin appears to be more vertigo related t positional related    [id number]

## 2013-12-30 LAB — TSH: TSH: 1.7 u[IU]/mL (ref 0.41–5.90)

## 2013-12-30 LAB — CBC AND DIFFERENTIAL
HEMATOCRIT: 35 % — AB (ref 36–46)
HEMOGLOBIN: 11.9 g/dL — AB (ref 12.0–16.0)
PLATELETS: 317 10*3/uL (ref 150–399)
WBC: 11.8 10^3/mL

## 2013-12-30 LAB — BASIC METABOLIC PANEL
BUN: 25 mg/dL — AB (ref 4–21)
Creatinine: 1.4 mg/dL — AB (ref 0.5–1.1)
Glucose: 93 mg/dL
Potassium: 4.8 mmol/L (ref 3.4–5.3)
Sodium: 136 mmol/L — AB (ref 137–147)

## 2013-12-30 LAB — HEPATIC FUNCTION PANEL
ALT: 29 U/L (ref 7–35)
AST: 12 U/L — AB (ref 13–35)
Alkaline Phosphatase: 105 U/L (ref 25–125)
BILIRUBIN, TOTAL: 0.5 mg/dL

## 2014-01-09 ENCOUNTER — Non-Acute Institutional Stay (SKILLED_NURSING_FACILITY): Payer: Medicare Other | Admitting: Internal Medicine

## 2014-01-09 ENCOUNTER — Encounter: Payer: Self-pay | Admitting: Internal Medicine

## 2014-01-09 DIAGNOSIS — I482 Chronic atrial fibrillation, unspecified: Secondary | ICD-10-CM

## 2014-01-09 DIAGNOSIS — I10 Essential (primary) hypertension: Secondary | ICD-10-CM

## 2014-01-09 DIAGNOSIS — D509 Iron deficiency anemia, unspecified: Secondary | ICD-10-CM

## 2014-01-09 DIAGNOSIS — N183 Chronic kidney disease, stage 3 unspecified: Secondary | ICD-10-CM

## 2014-01-09 DIAGNOSIS — N184 Chronic kidney disease, stage 4 (severe): Secondary | ICD-10-CM

## 2014-01-09 DIAGNOSIS — N179 Acute kidney failure, unspecified: Secondary | ICD-10-CM | POA: Insufficient documentation

## 2014-01-09 DIAGNOSIS — I5022 Chronic systolic (congestive) heart failure: Secondary | ICD-10-CM

## 2014-01-09 DIAGNOSIS — E559 Vitamin D deficiency, unspecified: Secondary | ICD-10-CM

## 2014-01-09 NOTE — Assessment & Plan Note (Signed)
Controlled on lisinopril, toprol, demadex

## 2014-01-09 NOTE — Assessment & Plan Note (Signed)
Seen by cards recently-rec dec amiodorine to 100 mg daily and that is done;xarelto as prophylaxis

## 2014-01-09 NOTE — Assessment & Plan Note (Signed)
11/2013 18/1.3 with GFR 48.35 , which is stable over past 3 months

## 2014-01-09 NOTE — Assessment & Plan Note (Signed)
Vit D 31 in 08/2012 - continue replacement

## 2014-01-09 NOTE — Assessment & Plan Note (Signed)
10./32 on 11/2013 which is stable for her; PLT 290

## 2014-01-09 NOTE — Assessment & Plan Note (Addendum)
Baseline BNP 10/2013 was 141;pt on demadex, Bblocker, ACE

## 2014-01-09 NOTE — Progress Notes (Signed)
MRN: [id number] Name: Kara Wilkerson  Sex: female Age: 80 y.o. DOB: 10/10/1922  [facility] #: [facility] Facility/Room:  Level Of Care: SNF Provider: Merrilee Seashore D Emergency Contacts: Extended Emergency Contact Information Primary Emergency Contact: Huntely,Douschkas  [place] Home Phone: [phone] Relation: Niece  Code Status: DNR  Allergies: Codeine  Chief Complaint  Patient presents with  . Medical Management of Chronic Issues    HPI: Patient is 80 y.o. female who is being seen for routine issues.  Past Medical History  Diagnosis Date  . CHF (congestive heart failure)   . A-fib   . Hypertension   . Constipation   . Arthritis   . Chronic systolic heart failure   . Dependence on supplemental oxygen   . Other chronic pain   . Contracture of ankle and foot joint   . Muscle weakness (generalized)   . Lack of coordination   . Unspecified essential hypertension   . Edema   . GERD (gastroesophageal reflux disease)   . Hyperlipidemia     Past Surgical History  Procedure Laterality Date  . Right ankle orif    . Fracture surgery        Medication List       This list is accurate as of: 01/09/14  9:18 PM.  Always use your most recent med list.               acetaminophen 500 MG tablet  Commonly known as:  TYLENOL  Take 1,000 mg by mouth 2 (two) times daily.     amiodarone 200 MG tablet  Commonly known as:  PACERONE  Take 100 mg by mouth daily. For A-Fib     calcium-vitamin D 250-125 MG-UNIT per tablet  Commonly known as:  OSCAL WITH D  Take 1 tablet by mouth 3 (three) times daily.     cholecalciferol 1000 UNITS tablet  Commonly known as:  VITAMIN D  Take 2,000 Units by mouth daily.     diclofenac sodium 1 % Gel  Commonly known as:  VOLTAREN  Apply 4 g topically 2 (two) times daily. To both knees     digoxin 0.125 MG tablet  Commonly known as:  LANOXIN  Take 0.125 mg by mouth daily. Give 1 tablet by mouth daily except  Saturday  and Sunday     ferrous sulfate 325 (65 FE) MG tablet  Take 325 mg by mouth daily with breakfast.     fluticasone 50 MCG/ACT nasal spray  Commonly known as:  FLONASE  Place 2 sprays into the nose daily. For allergies     glucosamine-chondroitin 500-400 MG tablet  Take 2 tablets by mouth 2 (two) times daily.     guaiFENesin 100 MG/5ML Soln  Commonly known as:  ROBITUSSIN  Take 10 mLs by mouth every 6 (six) hours as needed for cough or to loosen phlegm.     lidocaine 5 %  Commonly known as:  LIDODERM  Place 1 patch onto the skin daily. Remove & Discard patch within 12 hours to left shoulder     lisinopril 2.5 MG tablet  Commonly known as:  PRINIVIL,ZESTRIL  Take 2.5 mg by mouth daily. Hold for SBP less than 100.     metoprolol succinate 25 MG 24 hr tablet  Commonly known as:  TOPROL-XL  Take 12.5 mg by mouth daily. Hold for SBP less than 100     oxyCODONE 5 MG immediate release tablet  Commonly known as:  Oxy IR/ROXICODONE  Take  1 tablet every 3 hours as needed for pain     polyethylene glycol packet  Commonly known as:  MIRALAX / GLYCOLAX  Take 17 g by mouth daily.     potassium chloride SA 20 MEQ tablet  Commonly known as:  K-DUR,KLOR-CON  Take 40 mEq by mouth 2 (two) times daily.     Rivaroxaban 15 MG Tabs tablet  Commonly known as:  XARELTO  Take 15 mg by mouth daily with supper. Give 1 tablet by mouth daily after evening meal for A-Fib.     sennosides-docusate sodium 8.6-50 MG tablet  Commonly known as:  SENOKOT-S  Take 2 tablets by mouth at bedtime.     sertraline 50 MG tablet  Commonly known as:  ZOLOFT  Take 50 mg by mouth every morning. For depression     SYSTANE 0.4-0.3 % Soln  Generic drug:  Polyethyl Glycol-Propyl Glycol  Apply 1 drop to eye daily. Administer one drop in to each eye once daily for eye irritation.     torsemide 20 MG tablet  Commonly known as:  DEMADEX  Take 20 mg by mouth 2 (two) times daily.        Meds ordered this encounter   Medications  . guaiFENesin (ROBITUSSIN) 100 MG/5ML SOLN    Sig: Take 10 mLs by mouth every 6 (six) hours as needed for cough or to loosen phlegm.  . calcium-vitamin D (OSCAL WITH D) 250-125 MG-UNIT per tablet    Sig: Take 1 tablet by mouth 3 (three) times daily.  . ferrous sulfate 325 (65 FE) MG tablet    Sig: Take 325 mg by mouth daily with breakfast.    Immunization History  Administered Date(s) Administered  . Influenza Whole 01/22/2013  . PPD Test 11/20/2011    History  Substance Use Topics  . Smoking status: Never Smoker   . Smokeless tobacco: Never Used  . Alcohol Use: No    Review of Systems  DATA OBTAINED: from patient; no c/o GENERAL:  no fevers, fatigue, appetite changes SKIN: No itching, rash HEENT: No complaint RESPIRATORY: No cough, wheezing, SOB CARDIAC: No chest pain, palpitations, lower extremity edema  GI: No abdominal pain, No N/V/D or constipation, No heartburn or reflux  GU: No dysuria, frequency or urgency, or incontinence  MUSCULOSKELETAL: No unrelieved bone/joint pain NEUROLOGIC: No headache, dizziness  PSYCHIATRIC: No overt anxiety or sadness  Filed Vitals:   01/09/14 1300  BP: 130/72  Pulse: 64  Temp: 98 F (36.7 C)  Resp: 20    Physical Exam  GENERAL APPEARANCE: Alert, conversant, No acute distress  SKIN: No diaphoresis rash, or wounds HEENT: Unremarkable RESPIRATORY: Breathing is even, unlabored. Lung sounds are clear   CARDIOVASCULAR: Heart RRR no murmurs, rubs or gallops. trace peripheral edema ; wearing TED hose GASTROINTESTINAL: Abdomen is soft, non-tender, not distended w/ normal bowel sounds.  GENITOURINARY: Bladder non tender, not distended  MUSCULOSKELETAL: No abnormal joints or musculature NEUROLOGIC: Cranial nerves 2-12 grossly intact. Moves all extremities PSYCHIATRIC: Mood and affect appropriate to situation, no behavioral issues  Patient Active Problem List   Diagnosis Date Noted  . CKD (chronic kidney disease)  stage 3, GFR 30-59 ml/min 01/09/2014  . Edema 09/03/2013  . Anemia 09/03/2013  . Depression 08/13/2013  . Vitamin D deficiency 08/13/2013  . A-fib 10/16/2012  . Essential hypertension, benign 10/16/2012  . Chronic systolic CHF (congestive heart failure) 10/16/2012  . Unspecified constipation 10/16/2012  . Osteoarthritis 10/16/2012  . Other malaise and fatigue 10/16/2012  .  Partial tear of rotator cuff(726.13) 09/30/2012   CBC Latest Ref Rng 12/30/2013  WBC - 11.8  Hemoglobin 12.0 - 16.0 g/dL 11.9(A)  Hematocrit 36 - 46 % 35(A)  Platelets 150 - 399 K/L 317     CBC    Component Value Date/Time   WBC 11.8 12/30/2013    CMP  CMP     Component Value Date/Time   NA 136* 12/30/2013   K 4.8 12/30/2013   BUN 25* 12/30/2013   CREATININE 1.4* 12/30/2013   AST 12* 12/30/2013   ALT 29 12/30/2013   ALKPHOS 105 12/30/2013        Component Value Date/Time   NA 136* 12/30/2013    Assessment and Plan  A-fib Seen by cards recently-rec dec amiodorine to 100 mg daily and that is done;xarelto as prophylaxis  Anemia 10./32 on 11/2013 which is stable for her; PLT 290  CKD (chronic kidney disease) stage 3, GFR 30-59 ml/min 11/2013 18/1.3 with GFR 48.35 , which is stable over past 3 months  Chronic systolic CHF (congestive heart failure) Baseline BNP 10/2013 was 141;pt on demadex, Bblocker, ACE  Vitamin D deficiency Vit D 31 in 08/2012 - continue replacement  Essential hypertension, benign Controlled on lisinopril, toprol, demadex    Margit Hanks, MD

## 2014-02-19 ENCOUNTER — Non-Acute Institutional Stay (SKILLED_NURSING_FACILITY): Payer: Medicare Other | Admitting: Internal Medicine

## 2014-02-19 DIAGNOSIS — I482 Chronic atrial fibrillation, unspecified: Secondary | ICD-10-CM

## 2014-02-19 DIAGNOSIS — R609 Edema, unspecified: Secondary | ICD-10-CM

## 2014-02-19 DIAGNOSIS — D508 Other iron deficiency anemias: Secondary | ICD-10-CM

## 2014-02-19 DIAGNOSIS — N183 Chronic kidney disease, stage 3 unspecified: Secondary | ICD-10-CM

## 2014-02-19 DIAGNOSIS — I5022 Chronic systolic (congestive) heart failure: Secondary | ICD-10-CM

## 2014-02-19 NOTE — Progress Notes (Signed)
[name], female   DOB: December 01, 1922, 80 y.o.   MRN: [id number]                 Wt                                                         Code Status: DNR  Allergies: Codeine  Chief Complaint  Patient presents with  . Medical Management of Chronic Issues    HPI: Patient is 80 y.o. female who is being seen for routine issues--patient has a couple complaints including some  Complaints of her tooth being loose we will order a dental consult.  Also there is some questionable possible recent weight gain of about 3 pounds. She does have a history of systolic CHF with an ejection fraction of 20-25 percent as well as grade 2 diastolic dysfunction.--she is on Demadex 20 mg twice a day along with aggressive potassium supplementation.  She does not report any increase shortness of breath edema appears to be at baseline.  She does report occasional dizziness when she first gets up in the morning-it appears cardiology has looked at this as well and she is on Antivert as needed  Her vital signs appear to be stable.  .    Past Medical History  Diagnosis Date  . CHF (congestive heart failure)   . A-fib   . Hypertension   . Constipation   . Arthritis   . Chronic systolic heart failure   . Dependence on supplemental oxygen   . Other chronic pain   . Contracture of ankle and foot joint   . Muscle weakness (generalized)   . Lack of coordination   . Unspecified essential hypertension   . Edema   . GERD (gastroesophageal reflux disease)   . Hyperlipidemia     Past Surgical History  Procedure Laterality Date  . Right ankle orif    . Fracture surgery        Medication List        .              acetaminophen 500 MG tablet  Commonly known as: TYLENOL  Take 1,000 mg by mouth 2 (two) times daily.     amiodarone 200 MG tablet  Commonly  known as: PACERONE  Take 100 mg by mouth daily. For A-Fib     calcium-vitamin D 250-125 MG-UNIT per tablet  Commonly known as: OSCAL WITH D  Take 1 tablet by mouth 3 (three) times daily.     cholecalciferol 1000 UNITS tablet  Commonly known as: VITAMIN D  Take 2,000 Units by mouth daily.     diclofenac sodium 1 % Gel  Commonly known as: VOLTAREN  Apply 4 g topically 2 (two) times daily. To both knees     digoxin 0.125 MG tablet  Commonly known as: LANOXIN  Take 0.125 mg by mouth daily. Give 1 tablet by mouth daily except Saturday and Sunday     ferrous sulfate 325 (65 FE) MG tablet  Take 325 mg by mouth daily with breakfast.     fluticasone 50 MCG/ACT nasal spray  Commonly known as: FLONASE  Place 2 sprays into the nose daily. For allergies     glucosamine-chondroitin 500-400 MG tablet  Take 2 tablets by mouth 2 (two) times daily.  guaiFENesin 100 MG/5ML Soln  Commonly known as: ROBITUSSIN  Take 10 mLs by mouth every 6 (six) hours as needed for cough or to loosen phlegm.     lidocaine 5 %  Commonly known as: LIDODERM  Place 1 patch onto the skin daily. Remove & Discard patch within 12 hours to left shoulder     lisinopril 2.5 MG tablet  Commonly known as: PRINIVIL,ZESTRIL  Take 2.5 mg by mouth daily. Hold for SBP less than 100.     metoprolol succinate 25 MG 24 hr tablet  Commonly known as: TOPROL-XL  Take 12.5 mg by mouth daily. Hold for SBP less than 100     oxyCODONE 5 MG immediate release tablet  Commonly known as: Oxy IR/ROXICODONE  Take 1 tablet every 3 hours as needed for pain     polyethylene glycol packet  Commonly known as: MIRALAX / GLYCOLAX  Take 17 g by mouth daily.     potassium chloride SA 20 MEQ tablet  Commonly known as: K-DUR,KLOR-CON  Take 40 mEq by mouth 2 (two) times daily.     Rivaroxaban 15 MG Tabs tablet  Commonly known as: XARELTO  Take 15 mg  by mouth daily with supper. Give 1 tablet by mouth daily after evening meal for A-Fib.     sennosides-docusate sodium 8.6-50 MG tablet  Commonly known as: SENOKOT-S  Take 2 tablets by mouth at bedtime.     sertraline 50 MG tablet  Commonly known as: ZOLOFT  Take 50 mg by mouth every morning. For depression     SYSTANE 0.4-0.3 % Soln  Generic drug: Polyethyl Glycol-Propyl Glycol  Apply 1 drop to eye daily. Administer one drop in to each eye once daily for eye irritation.     torsemide 20 MG tablet  Commonly known as: DEMADEX  Take 20 mg by mouth 2 (two) times daily.        Meds ordered this encounter  Medications  . guaiFENesin (ROBITUSSIN) 100 MG/5ML SOLN    Sig: Take 10 mLs by mouth every 6 (six) hours as needed for cough or to loosen phlegm.  . calcium-vitamin D (OSCAL WITH D) 250-125 MG-UNIT per tablet    Sig: Take 1 tablet by mouth 3 (three) times daily.  . ferrous sulfate 325 (65 FE) MG tablet    Sig: Take 325 mg by mouth daily with breakfast.    Immunization History  Administered Date(s) Administered  . Influenza Whole 01/22/2013  . PPD Test 11/20/2011    History  Substance Use Topics  . Smoking status: Never Smoker   . Smokeless tobacco: Never Used  . Alcohol Use: No    Review of Systems  DATA OBTAINED: from patient; GENERAL: no fevers, fatigue, appetite changes SKIN: No itching, rash HEENT: does complain of some loose teeth but no acute pain RESPIRATORY: No cough, wheezing, SOB CARDIAC: No chest pain, palpitations, has mild lower extremity edema  GI: No abdominal pain, No N/V/D or constipation, No heartburn or reflux  GU: No dysuria, frequency or urgency, or incontinence  MUSCULOSKELETAL: continues to complain of some left shoulder discomfort she does have a Lidoderm patch which apparently gives some relief does have a history of a rotator cuff tear with limited  mobility NEUROLOGIC: No headache,terminated dizziness noted above PSYCHIATRIC: No overt anxiety or sadness                      Physical Exam Temperature is 96.6 pulse 58 respirations 18 blood pressure 118/60  GENERAL APPEARANCE: Alert, conversant, No acute distress -sitting comfortably in her wheelchair SKIN: No diaphoresis rash, or wounds HEENT: ---mouth she does have numerous extractions I did not note any ulcers bleeding drainage-I do note some diffuse decay RESPIRATORY: Breathing is even, unlabored. Lung sounds are clear  CARDIOVASCULAR: Heart RRR no murmurs, rubs or gallops----I would say she has trace possibly 1+ lower extremity edema she has had her legs and a dependent position today. t; wearing TED hose GASTROINTESTINAL: Abdomen is soft, non-tender, not distended w/ normal bowel sounds.  GENITOURINARY: Bladder non tender, not distended  MUSCULOSKELETAL: No abnormal joints or musculature--she is able to stand but is quite weak NEUROLOGIC: Cranial nerves 2-12 grossly intact. Moves all extremities PSYCHIATRIC: Mood and affect appropriate to situation, no behavioral issues  Patient Active Problem List   Diagnosis Date Noted  . CKD (chronic kidney disease) stage 3, GFR 30-59 ml/min 01/09/2014  . Edema 09/03/2013  . Anemia 09/03/2013  . Depression 08/13/2013  . Vitamin D deficiency 08/13/2013  . A-fib 10/16/2012  . Essential hypertension, benign 10/16/2012  . Chronic systolic CHF (congestive heart failure) 10/16/2012  . Unspecified constipation 10/16/2012  . Osteoarthritis 10/16/2012  . Other malaise and fatigue 10/16/2012  . Partial tear of rotator cuff(726.13) 09/30/2012   CBC Latest Ref Rng 12/30/2013  WBC - 11.8  Hemoglobin 12.0 - 16.0 g/dL 11.9(A)  Hematocrit 36 - 46 % 35(A)  Platelets 150 - 399 K/L 317     CBC  Labs (Brief)       Component Value Date/Time   WBC 11.8 12/30/2013       CMP CMP  Labs (Brief)       Component Value Date/Time   NA 136* 12/30/2013   K 4.8 12/30/2013   BUN 25* 12/30/2013   CREATININE 1.4* 12/30/2013   AST 12* 12/30/2013   ALT 29 12/30/2013   ALKPHOS 105 12/30/2013        Labs (Brief)       Component Value Date/Time   NA 136* 12/30/2013      Assessment and Plan  A-fib Seen by cards recently-rec dec amiodorine to 100 mg daily and that is done;xarelto as prophylaxis--  Anemia 10./32 on 11/2013 which is stable for her; PLT 290  CKD (chronic kidney disease) stage 3, GFR 30-59 ml/min Most recent creatinine 1.44 BUN 25 will update this  Chronic systolic CHF (congestive heart failure) Baseline BNP 10/2013 was 141;pt on demadex, Bblocker, ACE--Will update a metabolic panel as well as a BNP edema appears to be relatively on baseline--also monitor weights closely and obtain a weight tomorrow  To see if there is any continued weight gain clinically she appears to be stable  Vitamin D deficiency Vit D 31 in 08/2012 - continue replacement  Essential hypertension, benign Controlled on lisinopril, toprol, demadex--  dizziness-believe this is thought to be vertigosounds like this is somewhat the case with her rising in the morning and having this of short duration-I did attempt orthostatic blood pressures but this was difficult secondary to patient's weakness being able to stand-at this point will monitor apparently this has been somewhat chronic but will have to be watched.  She continues on Antivert when necessary.  Dental issues-will order a dental consult patient does not appear to be having acute discomfort with this but would benefit from dental input she does complain of some loose teeth she does appear to have some decay with numerous extractions.  anemia-she continues on iron supplementation will update CBC  Continue to monitor vital signs pulse ox every shift here for now to  keep an eye on her numerous issues. [id number] note greater than 35 minutes spent assessing patient-discussing her numerous concerns-in coordinating and formulating a plan of care for numerous diagnoses-of note greater than 50% of time spent coordinating plan of care                   Essential hypertension, benign - Kara Hanks, MD at 01/09/2014 9:17 PM     Status: Written Related Problem: Essential hypertension, benign   Expand All Collapse All   Controlled on lisinopril, toprol, demadex                                                                      Error         Other Encounter Related Information                                                                Code Description Service Date Service Provider Modifiers Qty                                            No data filed        No data filed        No data filed

## 2014-02-22 ENCOUNTER — Encounter: Payer: Self-pay | Admitting: Internal Medicine

## 2014-03-09 ENCOUNTER — Other Ambulatory Visit: Payer: Self-pay | Admitting: *Deleted

## 2014-03-09 MED ORDER — OXYCODONE HCL 5 MG PO TABS: ORAL_TABLET | ORAL | Status: DC

## 2014-03-09 NOTE — Telephone Encounter (Signed)
[facility] 

## 2014-03-12 ENCOUNTER — Non-Acute Institutional Stay (SKILLED_NURSING_FACILITY): Payer: Medicare Other | Admitting: Internal Medicine

## 2014-03-12 ENCOUNTER — Encounter: Payer: Self-pay | Admitting: Internal Medicine

## 2014-03-12 DIAGNOSIS — N183 Chronic kidney disease, stage 3 unspecified: Secondary | ICD-10-CM

## 2014-03-12 DIAGNOSIS — I482 Chronic atrial fibrillation, unspecified: Secondary | ICD-10-CM

## 2014-03-12 DIAGNOSIS — D509 Iron deficiency anemia, unspecified: Secondary | ICD-10-CM

## 2014-03-12 DIAGNOSIS — I5022 Chronic systolic (congestive) heart failure: Secondary | ICD-10-CM

## 2014-03-12 NOTE — Progress Notes (Signed)
[name], female   DOB: [birth year], 80 y.o.   MRN: [id number]   This is a routine visit.  Level care skilled.  Facility [facility].  Chief complaint-medical management of chronic issues including congestive heart failure atrial fibrillation hypertension chronic pain edema GERD.  History of present illness.  Patient is a pleasant 80 year old female with the above diagnoses per nursing staff she continues to be quite stable considering her multiple medical issues.  She does have some weight fluctuations at times dietary is left a note about 3 pound weight gain it appears over the past week or 2-however per chart review and speaking with our weight tech at this is relatively stable the past month she does have some variation  She does not complain of any increased shortness of breath or edema appears to be baseline to actually improved although she is in bed today which is probably helping with the edema-she does have a history of chronic systolic CHF with an ejection fraction of 25% she is on Lasix with potassium supplementation.  She also continues on digoxin.  She also has a history of atrial fibrillation this appears rate controlled she is on amiodarone as well as digoxin as well as metoprolol for rate control.--She is anticoagulated with Xarelto  She has some chronic pain-this appears to be relatively controlled-she does receive   Voltaren gel to her knees as well as oxycodone when necessary  She has a history of anemia she is on iron supplementation and hemoglobin appears to be relatively stable most recent lab I see on  chart is 11.9 on September 23  [facility] medical history.  CHF.  A. fib.  Hypertension.  Constipation.  Arthritis.  Chronic pain.  Muscle weakness.  Edema.  GERD.  Hyperlipidemia.  Family medical social history reviewed per recent progress notes including 01/09/2014.  Medications.  Guaifenesin 10 mL every 6 hours when  necessary.  Calcium with vitamin D 1 tab 3 times a day with meals.  Antivert 12.5 mg every 12 hours when necessary dizziness.  Milk of magnesia when necessary constipation.  Digoxin 125 g daily except Saturdays and Sundays.  MiraLAX daily.  Flonase 0.05% nasal spray 2 sprays each nostril daily.  Systane eyedrops 1 drop each eye daily.  Vitamin D 2000 units 1 capsule daily.  Lidoderm 5% patch to left shoulder every 12 hours on in a.m. off in p.m.  Metoprolol extended release 12.5 mg daily.  Iron 325 mg daily.  Zoloft 50 mg daily.  Amiodarone 200 mg give one half tab by mouth 100 mg daily   Xarelto 50 mg daily.  Lisinopril 2.5 mg daily at bedtime.  Senna 2 tabs by mouth bedtime.  Oxycodone 5 mg 1 tablet every 3 hours when necessary pain glucosamine 2 capsules twice a day.  Tylenol 1000 mg twice a day.  Voltaren gel  1% gel 4 g to knees twice a day.  Potassium 20 mEq give 2 tablets equals 40 mEq twice a day.  Torsemide 20 mg twice a day  Review of systems.  In general no complaints of fever or chills.  Skin does not complain of itching or rashes.  Head ears eyes nose mouth and throat-is not have any complaints today had complain of some dental discomfort on previous exam apparently she has seen a dentist since then does not complain of that today.  Respiratory no complaints of increased shortness of breath or cough.  Cardiac does not complain of chest pain has chronic lower  extremity edema with history of CHF as noted above.  GI does not complain of abdominal discomfort nausea or vomiting or constipation she is on numerous agents for constipation.  GU does not complain of dysuria.  Musculoskeletal does not complain his time of acute joint pain does have history of shoulder pain especially left shoulder pain which is chronic  Complains of some intermittent pain lateral left great toe.  Neurologic does not complain of dizziness or headache.  Psych does  not complain of overt anxiety or sadness continues to be in good spirits it appears.  Physical exam.  She is afebrile pulse of 66 respirations 19 blood pressure 124/65.  In general this is a pleasant elderly female in no distress resting comfortably in bed.  Her skin is warm and dry.  Head ears eyes nose mouth and throat-oropharynx is clear mucous membranes moist.  Continues with a disconjugate right eye with lateral deviation-she has prescription lenses.  Oropharynx clear mucous membranes moist.  Chest is clear to auscultation there is no labored breathing.  Heart is regular rate and rhythm without murmur gallop or rub she has I would say trace lower extremity edema today again she does have her legs elevated.  Abdomen is soft nontender positive bowel sounds.  Muscle skeletal-continues with limited range of motion of her left arm secondary to her shoulder issues I did not note any deformities 4.  I did assess her left great toe lateral aspect I did not note any discharge bleeding or sign of infection there is really no tenderness to palpation of the area today she says this is more so at night   Neurologic appears grossly intact without lateralizing findings her speech is clear cranial nerves appear grossly intact she does have right eye lateral deviation.  Labs.  12/31/2013.  WBC 11.8 hemoglobin 11.9 platelets 317.  Sodium 136 potassium 4.8 BUN 25 creatinine 1.44.  Liver function tests within normal limits.  TSH-1.696.  Digoxin-0.2.  Assessment plan.  Atrial fibrillation-this appears stable she is seen by cardiology-she is on Lopressor for rate control and Xarelto for prophylaxis.--Is on digoxin as well as amiodarone will check a digoxin level  Anemia this appears stable will update labs she is on iron.  Chronic kidney disease-this appears relatively stable with creatinine of 1.44 BUN of 25 will update this especially since she is on diuretic.  History of  hypertension appears to have somewhat variable systolics most recently 120s to 140s at this point will monitor she is on lisinopril Toprol as well as Demadex.  History of systolic CHF-this appears to be fairly stable she is followed by cardiology she is on Demadex with potassium supplementation Will update a metabolic panel.  Depression this appears stable she is on Zoloft.  Neck pain-again she is on numerous pain medications including oxycodone Lidoderm patch and Tylenol thousand milligrams twice a day this appears at this point to be fairly well controlled.  History of dizziness-this has been evaluated by cardiology as well she is now on Antivert as needed does not complain of dizziness today.  History of constipation-again she is on numerous agents apparently this is been stable she does not complaining of constipation today nursing staff has not reported any concerns apparently she is having regular bowel movements  History of toe  discomfort-again physical exam was quite benign at this point will monitor.  [id number] note greater than 35 minutes spent assessing patient-discussing her concerns at bedside-and  coordinating and formulating a plan of care for numerous  diagnoses-of note greater than 50% of time spent coordinating plan of care .        .Marland Kitchen

## 2014-03-12 NOTE — Progress Notes (Signed)
[name], female   DOB: 1922-09-27, 80 y.o.   MRN: [id number] [name], female   DOB: Jun 02, 1922, 80 y.o.   MRN: [id number]                 Wt                                                         Code Status: DNR  Allergies: Codeine  Chief Complaint  Patient presents with  . Medical Management of Chronic Issues    HPI: Patient is 80 y.o. female who is being seen for routine issue.  Also there is some questionable possible recent weight gain of about 3 pounds. She does have a history of systolic CHF with an ejection fraction of 20-25 percent as well as grade 2 diastolic dysfunction.--she is on Demadex 20 mg twice a day along with aggressive potassium supplementation.  She does not report any increase shortness of breath edema appears to be at baseline.  She does report occasional dizziness when she first gets up in the morning-it appears cardiology has looked at this as well and she is on Antivert as needed      Her vital signs appear to be stable.  .    Past Medical History  Diagnosis Date  . CHF (congestive heart failure)   . A-fib   . Hypertension   . Constipation   . Arthritis   . Chronic systolic heart failure   . Dependence on supplemental oxygen   . Other chronic pain   . Contracture of ankle and foot joint   . Muscle weakness (generalized)   . Lack of coordination   . Unspecified essential hypertension   . Edema   . GERD (gastroesophageal reflux disease)   . Hyperlipidemia     Past Surgical History  Procedure Laterality Date  . Right ankle orif    . Fracture surgery        Medication List        .              acetaminophen 500 MG tablet  Commonly known as: TYLENOL  Take 1,000 mg by mouth 2 (two) times daily.     amiodarone 200 MG tablet  Commonly known as: PACERONE  Take  100 mg by mouth daily. For A-Fib     calcium-vitamin D 250-125 MG-UNIT per tablet  Commonly known as: OSCAL WITH D  Take 1 tablet by mouth 3 (three) times daily.     cholecalciferol 1000 UNITS tablet  Commonly known as: VITAMIN D  Take 2,000 Units by mouth daily.     diclofenac sodium 1 % Gel  Commonly known as: VOLTAREN  Apply 4 g topically 2 (two) times daily. To both knees     digoxin 0.125 MG tablet  Commonly known as: LANOXIN  Take 0.125 mg by mouth daily. Give 1 tablet by mouth daily except Saturday and Sunday     ferrous sulfate 325 (65 FE) MG tablet  Take 325 mg by mouth daily with breakfast.     fluticasone 50 MCG/ACT nasal spray  Commonly known as: FLONASE  Place 2 sprays into the nose daily. For allergies     glucosamine-chondroitin 500-400 MG tablet  Take 2 tablets by mouth 2 (two) times daily.  guaiFENesin 100 MG/5ML Soln  Commonly known as: ROBITUSSIN  Take 10 mLs by mouth every 6 (six) hours as needed for cough or to loosen phlegm.     lidocaine 5 %  Commonly known as: LIDODERM  Place 1 patch onto the skin daily. Remove & Discard patch within 12 hours to left shoulder     lisinopril 2.5 MG tablet  Commonly known as: PRINIVIL,ZESTRIL  Take 2.5 mg by mouth daily. Hold for SBP less than 100.     metoprolol succinate 25 MG 24 hr tablet  Commonly known as: TOPROL-XL  Take 12.5 mg by mouth daily. Hold for SBP less than 100     oxyCODONE 5 MG immediate release tablet  Commonly known as: Oxy IR/ROXICODONE  Take 1 tablet every 3 hours as needed for pain     polyethylene glycol packet  Commonly known as: MIRALAX / GLYCOLAX  Take 17 g by mouth daily.     potassium chloride SA 20 MEQ tablet  Commonly known as: K-DUR,KLOR-CON  Take 40 mEq by mouth 2 (two) times daily.     Rivaroxaban 15 MG Tabs tablet  Commonly known as: XARELTO  Take 15 mg by mouth daily with supper.  Give 1 tablet by mouth daily after evening meal for A-Fib.     sennosides-docusate sodium 8.6-50 MG tablet  Commonly known as: SENOKOT-S  Take 2 tablets by mouth at bedtime.     sertraline 50 MG tablet  Commonly known as: ZOLOFT  Take 50 mg by mouth every morning. For depression     SYSTANE 0.4-0.3 % Soln  Generic drug: Polyethyl Glycol-Propyl Glycol  Apply 1 drop to eye daily. Administer one drop in to each eye once daily for eye irritation.     torsemide 20 MG tablet  Commonly known as: DEMADEX  Take 20 mg by mouth 2 (two) times daily.        Meds ordered this encounter  Medications  . guaiFENesin (ROBITUSSIN) 100 MG/5ML SOLN    Sig: Take 10 mLs by mouth every 6 (six) hours as needed for cough or to loosen phlegm.  . calcium-vitamin D (OSCAL WITH D) 250-125 MG-UNIT per tablet    Sig: Take 1 tablet by mouth 3 (three) times daily.  . ferrous sulfate 325 (65 FE) MG tablet    Sig: Take 325 mg by mouth daily with breakfast.    Immunization History  Administered Date(s) Administered  . Influenza Whole 01/22/2013  . PPD Test 11/20/2011    History  Substance Use Topics  . Smoking status: Never Smoker   . Smokeless tobacco: Never Used  . Alcohol Use: No    Review of Systems  DATA OBTAINED: from patient; GENERAL: no fevers, fatigue, appetite changes SKIN: No itching, rash HEENT: does complain of some loose teeth but no acute pain RESPIRATORY: No cough, wheezing, SOB CARDIAC: No chest pain, palpitations, has mild lower extremity edema  GI: No abdominal pain, No N/V/D or constipation, No heartburn or reflux  GU: No dysuria, frequency or urgency, or incontinence  MUSCULOSKELETAL: continues to complain of some left shoulder discomfort she does have a Lidoderm patch which apparently gives some relief does have a history of a rotator cuff tear with limited mobility NEUROLOGIC: No headache,terminated  dizziness noted above PSYCHIATRIC: No overt anxiety or sadness                      Physical Exam Temperature is 96.6 pulse 58 respirations 18 blood pressure 118/60  GENERAL APPEARANCE: Alert, conversant, No acute distress -sitting comfortably in her wheelchair SKIN: No diaphoresis rash, or wounds HEENT: ---mouth she does have numerous extractions I did not note any ulcers bleeding drainage-I do note some diffuse decay RESPIRATORY: Breathing is even, unlabored. Lung sounds are clear  CARDIOVASCULAR: Heart RRR no murmurs, rubs or gallops----I would say she has trace possibly 1+ lower extremity edema she has had her legs and a dependent position today. t; wearing TED hose GASTROINTESTINAL: Abdomen is soft, non-tender, not distended w/ normal bowel sounds.  GENITOURINARY: Bladder non tender, not distended  MUSCULOSKELETAL: No abnormal joints or musculature--she is able to stand but is quite weak NEUROLOGIC: Cranial nerves 2-12 grossly intact. Moves all extremities PSYCHIATRIC: Mood and affect appropriate to situation, no behavioral issues  Patient Active Problem List   Diagnosis Date Noted  . CKD (chronic kidney disease) stage 3, GFR 30-59 ml/min 01/09/2014  . Edema 09/03/2013  . Anemia 09/03/2013  . Depression 08/13/2013  . Vitamin D deficiency 08/13/2013  . A-fib 10/16/2012  . Essential hypertension, benign 10/16/2012  . Chronic systolic CHF (congestive heart failure) 10/16/2012  . Unspecified constipation 10/16/2012  . Osteoarthritis 10/16/2012  . Other malaise and fatigue 10/16/2012  . Partial tear of rotator cuff(726.13) 09/30/2012   CBC Latest Ref Rng 12/30/2013  WBC - 11.8  Hemoglobin 12.0 - 16.0 g/dL 11.9(A)  Hematocrit 36 - 46 % 35(A)  Platelets 150 - 399 K/L 317     CBC  Labs (Brief)       Component Value Date/Time   WBC 11.8 12/30/2013      CMP CMP  Labs (Brief)         Component Value Date/Time   NA 136* 12/30/2013   K 4.8 12/30/2013   BUN 25* 12/30/2013   CREATININE 1.4* 12/30/2013   AST 12* 12/30/2013   ALT 29 12/30/2013   ALKPHOS 105 12/30/2013        Labs (Brief)       Component Value Date/Time   NA 136* 12/30/2013      Assessment and Plan  A-fib Seen by cards recently-rec dec amiodorine to 100 mg daily and that is done;xarelto as prophylaxis--  Anemia 10./32 on 11/2013 which is stable for her; PLT 290  CKD (chronic kidney disease) stage 3, GFR 30-59 ml/min Most recent creatinine 1.44 BUN 25 will update this  Chronic systolic CHF (congestive heart failure) Baseline BNP 10/2013 was 141;pt on demadex, Bblocker, ACE--Will update a metabolic panel as well as a BNP edema appears to be relatively on baseline--also monitor weights closely and obtain a weight tomorrow  To see if there is any continued weight gain clinically she appears to be stable  Vitamin D deficiency Vit D 31 in 08/2012 - continue replacement  Essential hypertension, benign Controlled on lisinopril, toprol, demadex--  dizziness-believe this is thought to be vertigosounds like this is somewhat the case with her rising in the morning and having this of short duration-I did attempt orthostatic blood pressures but this was difficult secondary to patient's weakness being able to stand-at this point will monitor apparently this has been somewhat chronic but will have to be watched.  She continues on Antivert when necessary.  Dental issues-will order a dental consult patient does not appear to be having acute discomfort with this but would benefit from dental input she does complain of some loose teeth she does appear to have some decay with numerous extractions.  anemia-she continues on iron supplementation will update CBC  Continue to monitor vital signs pulse ox every shift here for now to keep an eye on her numerous issues. [id number]  note greater than 35 minutes spent assessing patient-discussing her numerous concerns-in coordinating and formulating a plan of care for numerous diagnoses-of note greater than 50% of time spent coordinating plan of care                   Essential hypertension, benign - Margit Hanks, MD at 01/09/2014 9:17 PM     Status: Written Related Problem: Essential hypertension, benign   Expand All Collapse All   Controlled on lisinopril, toprol, demadex                                                                      Error         Other Encounter Related Information                                                                Code Description Service Date Service Provider Modifiers Qty                                            No data filed        No data filed        No data filed                                                                  This encounter was created in error - please disregard.

## 2014-05-21 ENCOUNTER — Encounter: Payer: Self-pay | Admitting: Internal Medicine

## 2014-05-21 ENCOUNTER — Non-Acute Institutional Stay (SKILLED_NURSING_FACILITY): Payer: Medicare Other | Admitting: Internal Medicine

## 2014-05-21 DIAGNOSIS — M159 Polyosteoarthritis, unspecified: Secondary | ICD-10-CM

## 2014-05-21 DIAGNOSIS — M75112 Incomplete rotator cuff tear or rupture of left shoulder, not specified as traumatic: Secondary | ICD-10-CM | POA: Diagnosis not present

## 2014-05-21 DIAGNOSIS — M150 Primary generalized (osteo)arthritis: Secondary | ICD-10-CM | POA: Diagnosis not present

## 2014-05-21 DIAGNOSIS — I482 Chronic atrial fibrillation, unspecified: Secondary | ICD-10-CM

## 2014-05-21 DIAGNOSIS — N189 Chronic kidney disease, unspecified: Secondary | ICD-10-CM | POA: Diagnosis not present

## 2014-05-21 DIAGNOSIS — I5022 Chronic systolic (congestive) heart failure: Secondary | ICD-10-CM | POA: Diagnosis not present

## 2014-05-21 DIAGNOSIS — N183 Chronic kidney disease, stage 3 unspecified: Secondary | ICD-10-CM

## 2014-05-21 DIAGNOSIS — R609 Edema, unspecified: Secondary | ICD-10-CM | POA: Diagnosis not present

## 2014-05-21 DIAGNOSIS — D631 Anemia in chronic kidney disease: Secondary | ICD-10-CM

## 2014-05-21 NOTE — Progress Notes (Signed)
[name], female   DOB: Dec 05, 1922, 81 y.o.   MRN: [id number]   This is a routine visit.  Level care skilled.  Facility [facility].  Chief complaint-medical management of chronic issues including congestive heart failure atrial fibrillation hypertension chronic pain edema GERD.  History of present illness.  Patient is a pleasant 81 year old female with the above diagnoses per nursing staff she continues to be quite stable considering her multiple medical issues.  She does have some weight fluctuations at times but this does not appear to be persistent from what I can tell   She does not complain of any increased shortness of breath or edema appears to be baseline to actually improved although she is in bed today which is probably helping with the edema-she does have a history of chronic systolic CHF with an ejection fraction of 25% she is on Demedexwith potassium supplementation.  She also continues on digoxin.  She also has a history of atrial fibrillation this appears rate controlled she is on amiodarone as well as digoxin as well as metoprolol for rate control.--She is anticoagulated with Xarelto  She has some chronic pain-this appears to be relatively controlled-she does receive   Voltaren gel to her knees as well as oxycodone when necessary--has a Lidoderm patch on her left shoulder  She has a history of anemia she is on iron supplementation and hemoglobin appears to be relatively stable most recent lab I see on  chart is 10.9 on December 4th-2015  Previous medical history.  CHF.  A. fib.  Hypertension.  Constipation.  Arthritis.  Chronic pain.  Muscle weakness.  Edema.  GERD.  Hyperlipidemia.  Family medical social history reviewed per recent progress notes including 01/09/2014.  Medications.  Guaifenesin 10 mL every 6 hours when necessary.  Calcium with vitamin D 1 tab 3 times a day with meals.  Antivert 12.5 mg every 12 hours when  necessary dizziness.  Milk of magnesia when necessary constipation.  Digoxin 125 g daily except Saturdays and Sundays.  MiraLAX daily.  Flonase 0.05% nasal spray 2 sprays each nostril daily.  Systane eyedrops 1 drop each eye daily.  Vitamin D 2000 units 1 capsule daily.  Lidoderm 5% patch to left shoulder every 12 hours on in a.m. off in p.m.  Metoprolol extended release 12.5 mg daily.  Iron 325 mg daily.  Zoloft 50 mg daily.  Amiodarone 200 mg give one half tab by mouth 100 mg daily   Xarelto 15 mg daily.  Lisinopril 2.5 mg daily at bedtime.  Senna 2 tabs by mouth bedtime.  Oxycodone 5 mg 1 tablet every 3 hours when necessary pain glucosamine 2 capsules twice a day.  Tylenol 1000 mg twice a day.  Voltaren gel  1% gel 4 g to knees twice a day.  Potassium 20 mEq give 2 tablets equals 40 mEq twice a day.  Torsemide 20 mg twice a day  Review of systems.  In general no complaints of fever or chills.  Skin does not complain of itching or rashes.  Head ears eyes nose mouth and throat-is not have any complaints today had complain of some dental discomfort on previous exams--has seen dentist since then   Respiratory no complaints of increased shortness of breath or cough.  Cardiac does not complain of chest pain has chronic lower extremity edema with history of CHF as noted above.  GI does not complain of abdominal discomfort nausea or vomiting or constipation she is on numerous agents for constipation.  GU does not complain of dysuria.  Musculoskeletal does not complain his time of acute joint pain does have history of shoulder pain especially left shoulder pain which is chronic--occasionally complains of some left toe pain  .  Neurologic does not complain of dizziness or headache.  Psych does not complain of overt anxiety or sadness continues to be in good spirits it appears.  Physical exam.  She is afebrile pulse on exam today was 55 respirations 18  blood pressure 117/69.  In general this is a pleasant elderly female in no distress resting comfortably in bed.  Her skin is warm and dry.  Head ears eyes nose mouth and throat-oropharynx is clear mucous membranes moist.  Continues with a disconjugate right eye with lateral deviation-she has prescription lenses.  Oropharynx clear mucous membranes moist.  Chest is clear to auscultation there is no labored breathing.  Heart is regular rate and rhythm without murmur gallop or rub she has I would say trace lower extremity edema today again she does have her legs elevated.  Abdomen is soft nontender positive bowel sounds.  Muscle skeletal-continues with limited range of motion of her left arm secondary to her shoulder issues I did not note any deformities 4.  I did assess her left great toe lateral aspect I did not note any discharge bleeding or sign of infection there is really no tenderness to palpation of the area today--this appears to be stable with previous exam   Neurologic appears grossly intact without lateralizing findings her speech is clear cranial nerves appear grossly intact she does have right eye lateral deviation.  Labs  03/24/2015.  Sodium 139 potassium 4.2 BUN 33 creatinine 1.7.  03/14/2015. Digoxin level 0.7.  WBC 9.0 hemoglobin 10.9 platelets 271  02/20/2014--TSH-2.28.  BNP 56.  Liver function tests within normal limits albumin of 3.5.  Marland Kitchen  12/31/2013.  WBC 11.8 hemoglobin 11.9 platelets 317.  Sodium 136 potassium 4.8 BUN 25 creatinine 1.44.  Liver function tests within normal limits.  TSH-1.696.  Digoxin-0.2.  Assessment plan.  Atrial fibrillation-this appears stable she is seen by cardiology-she is on Lopressor for rate control and Xarelto for prophylaxis.--Is on digoxin as well as amiodarone will check a digoxin level--as well as a TSH since she is on amiodarone  Anemia this appears stable will update labs she is on iron.  Chronic kidney  disease-this appears relatively stable with creatinine of 1.7  BUN 33 will update this especially since she is on diuretic.  History of hypertension appears to be stable most recently 117/69 she is on lisinopril Toprol as well as Demadex.  History of systolic CHF-this appears to be fairly stable she is followed by cardiology she is on Demadex with potassium supplementation Will update a metabolic panel.  Depression this appears stable she is on Zoloft.  Pain issues-again she is on numerous pain medications including oxycodone Lidoderm patch and Tylenol thousand milligrams twice a day this appears at this point to be fairly well controlled.  History of dizziness-this has been evaluated by cardiology as well she is now on Antivert as needed does not complain of dizziness today.  History of constipation-again she is on numerous agents apparently this is been stable she does not complaining of constipation today nursing staff has not reported any concerns apparently she is having regular bowel movements  History of toe  discomfort-again physical exam was quite benign at this point will monitor.   [id number].

## 2014-05-22 LAB — HEPATIC FUNCTION PANEL
ALT: 10 U/L (ref 7–35)
AST: 14 U/L (ref 13–35)
Alkaline Phosphatase: 87 U/L (ref 25–125)
Bilirubin, Total: 0.4 mg/dL

## 2014-05-22 LAB — TSH: TSH: 2.07 u[IU]/mL (ref <=5.90)

## 2014-05-24 NOTE — Progress Notes (Signed)
This encounter was created in error - please disregard.

## 2014-07-02 ENCOUNTER — Non-Acute Institutional Stay (SKILLED_NURSING_FACILITY): Payer: Medicare Other | Admitting: Internal Medicine

## 2014-07-02 ENCOUNTER — Encounter: Payer: Self-pay | Admitting: Internal Medicine

## 2014-07-02 DIAGNOSIS — D649 Anemia, unspecified: Secondary | ICD-10-CM | POA: Diagnosis not present

## 2014-07-02 DIAGNOSIS — R609 Edema, unspecified: Secondary | ICD-10-CM

## 2014-07-02 DIAGNOSIS — I10 Essential (primary) hypertension: Secondary | ICD-10-CM

## 2014-07-02 DIAGNOSIS — I5022 Chronic systolic (congestive) heart failure: Secondary | ICD-10-CM | POA: Diagnosis not present

## 2014-07-02 DIAGNOSIS — M150 Primary generalized (osteo)arthritis: Secondary | ICD-10-CM

## 2014-07-02 DIAGNOSIS — I482 Chronic atrial fibrillation, unspecified: Secondary | ICD-10-CM

## 2014-07-02 DIAGNOSIS — M159 Polyosteoarthritis, unspecified: Secondary | ICD-10-CM

## 2014-07-02 DIAGNOSIS — N183 Chronic kidney disease, stage 3 unspecified: Secondary | ICD-10-CM

## 2014-07-02 NOTE — Progress Notes (Signed)
[name], female   DOB: 06-Jan-1923, 81 y.o.   MRN: [id number]   his is a routine visit.  Level care skilled.  Facility [facility].  Chief complaint-medical management of chronic issues including congestive heart failure atrial fibrillation hypertension chronic pain edema GERD.  History of present illness.  Patient is a pleasant 81 year old female with the above diagnoses per nursing staff she continues to be quite stable considering her multiple medical issues.  She does have some weight fluctuations at times but this does not appear to be persistent from what I can tell   She does not complain of any increased shortness of breath -- edema appears to be baseline -she does have a history of chronic systolic CHF with an ejection fraction of 25% she is on Demedexwith potassium supplementation.  She also continues on digoxin.  She also has a history of atrial fibrillation this appears rate controlled she is on amiodarone as well as digoxin as well as metoprolol for rate control.--She is anticoagulated with Xarelto  She has some chronic pain-this appears to be relatively controlled-she does receive   Voltaren gel to her knees as well as oxycodone when necessary--has orders for Lidoderm patch her left shoulder as well-she is not complaining of pain today  She has a history of anemia she is on iron supplementation and hemoglobin appears to be relatively stable most recent lab I see on 05/22/2014 was 10.8 which is stable.  She also has a history of chronic renal insufficiency this is been stable as well with most recent creatinine in February 1 0.5 BUN 22 which is relatively baseline  Previous medical history.  CHF.  A. fib.  Hypertension.  Constipation.  Arthritis.  Chronic pain.  Muscle weakness.  Edema.  GERD.  Hyperlipidemia.  Family medical social history reviewed per recent progress notes including 01/09/2014.  Medications.  Guaifenesin 10 mL  every 6 hours when necessary.  Calcium with vitamin D 1 tab 3 times a day with meals.  Antivert 12.5 mg every 12 hours when necessary dizziness.  Milk of magnesia when necessary constipation.  Digoxin 125 g daily except Saturdays and Sundays.  MiraLAX daily.  Flonase 0.05% nasal spray 2 sprays each nostril daily.  Systane eyedrops 1 drop each eye daily.  Vitamin D 2000 units 1 capsule daily.  Lidoderm 5% patch to left shoulder every 12 hours on in a.m. off in p.m.  Metoprolol extended release 12.5 mg daily.  Iron 325 mg daily.  Zoloft 50 mg daily.  Amiodarone 200 mg give one half tab by mouth 100 mg daily   Xarelto 15 mg daily.  Lisinopril 2.5 mg daily at bedtime.  Senna 2 tabs by mouth bedtime.  Oxycodone 5 mg 1 tablet every 3 hours when necessary pain glucosamine 2 capsules twice a day.  Tylenol 1000 mg twice a day.  Voltaren gel  1% gel 4 g to knees twice a day.  Potassium 20 mEq give 2 tablets equals 40 mEq twice a day.  Torsemide 20 mg twice a day  Review of systems.  In general no complaints of fever or chills.  Skin does not complain of itching or rashes.  Head ears eyes nose mouth and throat-is not have any complaints today had complain of some dental discomfort on previous exams--has seen dentist since then   Respiratory no complaints of increased shortness of breath or cough.  Cardiac does not complain of chest pain has chronic lower extremity edema with history of CHF as  noted above--at times, she thinks her edema is increasing but exam today is quite baseline.  GI does not complain of abdominal discomfort nausea or vomiting or constipation she is on numerous agents for constipation.  GU does not complain of dysuria.  Musculoskeletal does not complain his time of acute joint pain does have history of shoulder pain especially left shoulder pain which is chronic--occasionally complains of some left toe pain  .  Neurologic does not complain  of dizziness or headache.  Psych does not complain of overt anxiety or sadness continues to be in good spirits it appears.  Physical exam.  Temperature 97.3 pulse 60 respirations 18 blood per she 102/84-139/60-in this range  In general this is a pleasant elderly female in no distress resting comfortably in bed.  Her skin is warm and dry.  Head ears eyes nose mouth and throat-oropharynx is clear mucous membranes moist.  Continues with a disconjugate right eye with lateral deviation-she has prescription lenses.  Oropharynx clear mucous membranes moist.  Chest is clear to auscultation there is no labored breathing.  Heart is regular rate and rhythm without murmur gallop or rub she has I would say trace--1 plus lower extremity edema today--she has had her legs in a  somewhat dependent position today  Abdomen is soft nontender positive bowel sounds.  Muscle skeletal-continues with limited range of motion of her left arm secondary to her shoulder issues I did not note any deformities 4.    Neurologic appears grossly intact without lateralizing findings her speech is clear cranial nerves appear grossly intact she does have right eye lateral deviation.  Labs  Family 12 2016.  Sodium 140 potassium 4.1 BUN 22 creatinine 1.5-liver function tests within normal limits.  Digoxin 1.1.  WBC 8.8 hemoglobin 10.8 platelets 260.  TSH-2.07.    03/24/2015.  Sodium 139 potassium 4.2 BUN 33 creatinine 1.7.  03/14/2015. Digoxin level 0.7.  WBC 9.0 hemoglobin 10.9 platelets 271  02/20/2014--TSH-2.28.  BNP 56.  Liver function tests within normal limits albumin of 3.5.  Marland Kitchen  12/31/2013.  WBC 11.8 hemoglobin 11.9 platelets 317.  Sodium 136 potassium 4.8 BUN 25 creatinine 1.44.  Liver function tests within normal limits.  TSH-1.696.  Digoxin-0.2.  Assessment plan.  Atrial fibrillation-this appears stable she is seen by cardiology-she is on Lopressor for rate control and  Xarelto for prophylaxis.--Is on digoxin as well as amiodarone will check a digoxin level--recent TSH was within normal limits  Anemia this appears stable will update labs she is on iron.  Chronic kidney disease-this appears relatively stable with creatinine of 1.5 will update this especially since she is on diuretic.  History of hypertension appears to be stable most recently 102/64-139/60 she is on lisinopril Toprol as well as Demadex.  History of systolic CHF-this appears to be fairly stable she is followed by cardiology she is on Demadex with potassium supplementation Will update a metabolic panel.  Depression this appears stable she is on Zoloft.  Pain issues-again she is on numerous pain medications including oxycodone Lidoderm patch and Tylenol thousand milligrams twice a day this appears at this point to be fairly well controlled.  History of dizziness-this has been evaluated by cardiology as well she is now on Antivert as needed does not complain of dizziness today.  History of constipation-again she is on numerous agents apparently this is been stable she does not complaining of constipation today nursing staff has not reported any concerns apparently she is having regular bowel movements  [id number].

## 2014-07-03 LAB — BASIC METABOLIC PANEL
BUN: 21 mg/dL (ref 4–21)
Creatinine: 1.6 mg/dL — AB (ref <=1.1)
GLUCOSE: 90 mg/dL
Potassium: 4 mmol/L (ref 3.4–5.3)
SODIUM: 140 mmol/L (ref 137–147)

## 2014-07-03 LAB — CBC AND DIFFERENTIAL
HCT: 34 % — AB (ref 36–46)
Hemoglobin: 11 g/dL — AB (ref 12.0–16.0)
PLATELETS: 294 10*3/uL (ref 150–399)
WBC: 9.8 10*3/mL

## 2014-08-19 ENCOUNTER — Encounter: Payer: Self-pay | Admitting: *Deleted

## 2014-09-23 ENCOUNTER — Inpatient Hospital Stay (HOSPITAL_COMMUNITY)
Admission: EM | Admit: 2014-09-23 | Discharge: 2014-09-25 | DRG: 309 | Disposition: A | Discharge status: Skilled Nursing Facility | Payer: Medicare Other | Attending: Cardiology | Admitting: Cardiology

## 2014-09-23 ENCOUNTER — Encounter (HOSPITAL_COMMUNITY): Payer: Self-pay | Admitting: Emergency Medicine

## 2014-09-23 ENCOUNTER — Emergency Department (HOSPITAL_COMMUNITY): Payer: Medicare Other

## 2014-09-23 DIAGNOSIS — Z66 Do not resuscitate: Secondary | ICD-10-CM | POA: Diagnosis present

## 2014-09-23 DIAGNOSIS — I480 Paroxysmal atrial fibrillation: Secondary | ICD-10-CM | POA: Diagnosis present

## 2014-09-23 DIAGNOSIS — I5022 Chronic systolic (congestive) heart failure: Secondary | ICD-10-CM

## 2014-09-23 DIAGNOSIS — Z9981 Dependence on supplemental oxygen: Secondary | ICD-10-CM

## 2014-09-23 DIAGNOSIS — R627 Adult failure to thrive: Secondary | ICD-10-CM | POA: Diagnosis present

## 2014-09-23 DIAGNOSIS — N184 Chronic kidney disease, stage 4 (severe): Secondary | ICD-10-CM | POA: Diagnosis present

## 2014-09-23 DIAGNOSIS — N289 Disorder of kidney and ureter, unspecified: Secondary | ICD-10-CM | POA: Diagnosis present

## 2014-09-23 DIAGNOSIS — Z79899 Other long term (current) drug therapy: Secondary | ICD-10-CM

## 2014-09-23 DIAGNOSIS — R0602 Shortness of breath: Secondary | ICD-10-CM | POA: Insufficient documentation

## 2014-09-23 DIAGNOSIS — R609 Edema, unspecified: Secondary | ICD-10-CM

## 2014-09-23 DIAGNOSIS — M1990 Unspecified osteoarthritis, unspecified site: Secondary | ICD-10-CM | POA: Diagnosis present

## 2014-09-23 DIAGNOSIS — Z993 Dependence on wheelchair: Secondary | ICD-10-CM

## 2014-09-23 DIAGNOSIS — I420 Dilated cardiomyopathy: Secondary | ICD-10-CM | POA: Diagnosis present

## 2014-09-23 DIAGNOSIS — R001 Bradycardia, unspecified: Secondary | ICD-10-CM | POA: Diagnosis not present

## 2014-09-23 DIAGNOSIS — I447 Left bundle-branch block, unspecified: Secondary | ICD-10-CM | POA: Diagnosis present

## 2014-09-23 DIAGNOSIS — I440 Atrioventricular block, first degree: Secondary | ICD-10-CM | POA: Diagnosis present

## 2014-09-23 DIAGNOSIS — Z7901 Long term (current) use of anticoagulants: Secondary | ICD-10-CM

## 2014-09-23 DIAGNOSIS — E785 Hyperlipidemia, unspecified: Secondary | ICD-10-CM | POA: Diagnosis present

## 2014-09-23 DIAGNOSIS — Z515 Encounter for palliative care: Secondary | ICD-10-CM | POA: Insufficient documentation

## 2014-09-23 DIAGNOSIS — K219 Gastro-esophageal reflux disease without esophagitis: Secondary | ICD-10-CM | POA: Diagnosis present

## 2014-09-23 DIAGNOSIS — I129 Hypertensive chronic kidney disease with stage 1 through stage 4 chronic kidney disease, or unspecified chronic kidney disease: Secondary | ICD-10-CM | POA: Diagnosis present

## 2014-09-23 DIAGNOSIS — I5042 Chronic combined systolic (congestive) and diastolic (congestive) heart failure: Secondary | ICD-10-CM | POA: Diagnosis present

## 2014-09-23 DIAGNOSIS — G8929 Other chronic pain: Secondary | ICD-10-CM | POA: Diagnosis present

## 2014-09-23 LAB — CBC WITH DIFFERENTIAL/PLATELET
Basophils Absolute: 0 10*3/uL (ref 0.0–0.1)
Basophils Relative: 0 % (ref 0–1)
EOS ABS: 0 10*3/uL (ref 0.0–0.7)
EOS PCT: 0 % (ref 0–5)
HCT: 36.2 % (ref 36.0–46.0)
HEMOGLOBIN: 12 g/dL (ref 12.0–15.0)
LYMPHS ABS: 1.8 10*3/uL (ref 0.7–4.0)
LYMPHS PCT: 17 % (ref 12–46)
MCH: 29.5 pg (ref 26.0–34.0)
MCHC: 33.1 g/dL (ref 30.0–36.0)
MCV: 88.9 fL (ref 78.0–100.0)
MONOS PCT: 6 % (ref 3–12)
Monocytes Absolute: 0.6 10*3/uL (ref 0.1–1.0)
Neutro Abs: 8.1 10*3/uL — ABNORMAL HIGH (ref 1.7–7.7)
Neutrophils Relative %: 77 % (ref 43–77)
Platelets: 282 10*3/uL (ref 150–400)
RBC: 4.07 MIL/uL (ref 3.87–5.11)
RDW: 14 % (ref 11.5–15.5)
WBC: 10.6 10*3/uL — AB (ref 4.0–10.5)

## 2014-09-23 LAB — I-STAT CHEM 8, ED
BUN: 29 mg/dL — ABNORMAL HIGH (ref 6–20)
CALCIUM ION: 1.23 mmol/L (ref 1.13–1.30)
Chloride: 101 mmol/L (ref 101–111)
Creatinine, Ser: 1.8 mg/dL — ABNORMAL HIGH (ref 0.44–1.00)
Glucose, Bld: 102 mg/dL — ABNORMAL HIGH (ref 65–99)
HEMATOCRIT: 37 % (ref 36.0–46.0)
HEMOGLOBIN: 12.6 g/dL (ref 12.0–15.0)
Potassium: 4.7 mmol/L (ref 3.5–5.1)
Sodium: 139 mmol/L (ref 135–145)
TCO2: 27 mmol/L (ref 0–100)

## 2014-09-23 LAB — CBG MONITORING, ED: Glucose-Capillary: 96 mg/dL (ref 65–99)

## 2014-09-23 NOTE — ED Provider Notes (Signed)
CSN: [id number]     Arrival date & time 09/23/14  2251 History  This chart was scribed for Kara Severin, MD by Annye Asa, ED Scribe. This patient was seen in room B15C/B15C and the patient's care was started at 11:25 PM.    Chief Complaint  Patient presents with  . Weakness  . Bradycardia  . Nausea   The history is provided by the patient, the EMS personnel and a relative. No language interpreter was used.     HPI Comments: Kara Wilkerson is a 81 y.o. female resident of [facility] with past medical history of CHF, a-fib, HTN, HLD  who presents to the Emergency Department complaining of 2 days of generalized weakness, nausea, and dizziness. Patient also notes two episodes of vomiting today; she states she has not been eating well. She also notes swelling to her lower extremities at baseline. Patient's family explains that patient's facility called her to report that patient had been complaining of weakness, nausea, dizziness; they noted bradycardia and low blood pressure but did not specify further at that time. On EMS arrival, patient was bradycardic in the 40s with no prior history; her initial BP with EMS was 154/46 but dropped to 122/67. She was given  Zofran en route for nausea with relief. Patient reports only headache at present; denies any other pain. She denies cough.  Per nursing home records, patient's metoprolol has been held for the last day due to bradycardia.  Last given yesterday.  Patient is currently taking Xarelto. She is a DNR.   Past Medical History  Diagnosis Date  . CHF (congestive heart failure)   . A-fib   . Hypertension   . Constipation   . Arthritis   . Chronic systolic heart failure   . Dependence on supplemental oxygen   . Other chronic pain   . Contracture of ankle and foot joint   . Muscle weakness (generalized)   . Lack of coordination   . Unspecified essential hypertension   . Edema   . GERD (gastroesophageal reflux disease)   .  Hyperlipidemia    Past Surgical History  Procedure Laterality Date  . Right ankle orif    . Fracture surgery     Family History  Problem Relation Age of Onset  . Diabetes Mother   . Hypertension Mother   . Cancer Brother    History  Substance Use Topics  . Smoking status: Never Smoker   . Smokeless tobacco: Never Used  . Alcohol Use: No   OB History    No data available     Review of Systems  Respiratory: Negative for cough.   Cardiovascular: Positive for leg swelling.  Gastrointestinal: Positive for nausea.  Neurological: Positive for dizziness, weakness and headaches.  All other systems reviewed and are negative.   Allergies  Codeine  Home Medications   Prior to Admission medications   Medication Sig Start Date End Date Taking? Authorizing Provider  acetaminophen (TYLENOL) 500 MG tablet Take 1,000 mg by mouth 2 (two) times daily.    Historical Provider, MD  amiodarone (PACERONE) 200 MG tablet Take 100 mg by mouth daily. For A-Fib    Historical Provider, MD  calcium-vitamin D (OSCAL WITH D) 250-125 MG-UNIT per tablet Take 1 tablet by mouth 3 (three) times daily.    Historical Provider, MD  cholecalciferol (VITAMIN D) 1000 UNITS tablet Take 2,000 Units by mouth daily.    Historical Provider, MD  diclofenac sodium (VOLTAREN) 1 % GEL  Apply 4 g topically 2 (two) times daily. To both knees    Historical Provider, MD  digoxin (LANOXIN) 0.125 MG tablet Take 0.125 mg by mouth daily. Give 1 tablet by mouth daily except  Saturday and Sunday.For afib    Historical Provider, MD  ferrous sulfate 325 (65 FE) MG tablet Take 325 mg by mouth daily with breakfast.    Historical Provider, MD  fluticasone (FLONASE) 50 MCG/ACT nasal spray Place 2 sprays into the nose daily. For allergies    Historical Provider, MD  glucosamine-chondroitin 500-400 MG tablet Take 2 tablets by mouth 2 (two) times daily. For osteoarthritis    Historical Provider, MD  guaiFENesin (ROBITUSSIN) 100 MG/5ML SOLN  Take 10 mLs by mouth every 6 (six) hours as needed for cough or to loosen phlegm.    Historical Provider, MD  lidocaine (LIDODERM) 5 % Place 1 patch onto the skin daily. Remove & Discard patch within 12 hours to left shoulder    Historical Provider, MD  lisinopril (PRINIVIL,ZESTRIL) 2.5 MG tablet Take 2.5 mg by mouth daily. Hold for SBP less than 100.    Historical Provider, MD  magnesium hydroxide (MILK OF MAGNESIA) 400 MG/5ML suspension Take 60 mLs by mouth daily as needed for mild constipation or moderate constipation.    Historical Provider, MD  meclizine (ANTIVERT) 12.5 MG tablet Take 12.5 mg by mouth 2 (two) times daily. For dizziness    Historical Provider, MD  metoprolol succinate (TOPROL-XL) 25 MG 24 hr tablet Take 12.5 mg by mouth daily. Hold for SBP less than 100. For HTN    Historical Provider, MD  oxyCODONE (OXY IR/ROXICODONE) 5 MG immediate release tablet Take one tablet by mouth every four hours as needed for pain 03/09/14   Tiffany L Reed, DO  Polyethyl Glycol-Propyl Glycol (SYSTANE) 0.4-0.3 % SOLN Apply 1 drop to eye daily. Administer one drop in to each eye once daily for eye irritation.    Historical Provider, MD  polyethylene glycol (MIRALAX / GLYCOLAX) packet Take 17 g by mouth daily. For constipation    Historical Provider, MD  potassium chloride SA (K-DUR,KLOR-CON) 20 MEQ tablet Take 40 mEq by mouth 2 (two) times daily.    Historical Provider, MD  Rivaroxaban (XARELTO) 15 MG TABS tablet Take 15 mg by mouth daily with supper. Give 1 tablet by mouth daily after evening meal for A-Fib.    Historical Provider, MD  sennosides-docusate sodium (SENOKOT-S) 8.6-50 MG tablet Take 2 tablets by mouth at bedtime.     Historical Provider, MD  sertraline (ZOLOFT) 50 MG tablet Take 50 mg by mouth every morning. For depression    Historical Provider, MD  torsemide (DEMADEX) 20 MG tablet Take 20 mg by mouth 2 (two) times daily. For congestive heart failure 12/31/12   Tieshia A Durant, NP   BP  138/52 mmHg  Pulse 49  Temp(Src) 97.6 F (36.4 C) (Oral)  SpO2 97% Physical Exam  Constitutional: She is oriented to person, place, and time. She appears well-developed and well-nourished. No distress.  HENT:  Head: Normocephalic and atraumatic.  Dry mucous membranes  Eyes: EOM are normal. Pupils are equal, round, and reactive to light.  Neck: Normal range of motion. Neck supple. No JVD present.  Cardiovascular: Regular rhythm and normal heart sounds.  Bradycardia present.  Exam reveals no gallop and no friction rub.   No murmur heard. Pulmonary/Chest: Effort normal and breath sounds normal. No respiratory distress. She has no wheezes. She has no rales.  No crackles  Abdominal: Soft. Bowel sounds are normal. She exhibits no mass. There is no tenderness. There is no rebound and no guarding.  Musculoskeletal: Normal range of motion. She exhibits edema (2+ to knees).  Moves all extremities normally.   Lymphadenopathy:    She has no cervical adenopathy.  Neurological: She is alert and oriented to person, place, and time. She displays normal reflexes.  Skin: Skin is warm and dry. No rash noted.  Psychiatric: She has a normal mood and affect. Her behavior is normal.  Nursing note and vitals reviewed.   ED Course  Procedures   DIAGNOSTIC STUDIES: Oxygen Saturation is 97% on RA, adequate by my interpretation.    COORDINATION OF CARE: 11:34 PM Discussed treatment plan with pt at bedside and pt agreed to plan.   Labs Review Labs Reviewed  BASIC METABOLIC PANEL - Abnormal; Notable for the following:    Glucose, Bld 103 (*)    BUN 26 (*)    Creatinine, Ser 1.71 (*)    GFR calc non Af Amer 25 (*)    GFR calc Af Amer 29 (*)    All other components within normal limits  CBC WITH DIFFERENTIAL/PLATELET - Abnormal; Notable for the following:    WBC 10.6 (*)    Neutro Abs 8.1 (*)    All other components within normal limits  DIGOXIN LEVEL - Abnormal; Notable for the following:     Digoxin Level <0.2 (*)    All other components within normal limits  BRAIN NATRIURETIC PEPTIDE - Abnormal; Notable for the following:    B Natriuretic Peptide 124.4 (*)    All other components within normal limits  I-STAT CHEM 8, ED - Abnormal; Notable for the following:    BUN 29 (*)    Creatinine, Ser 1.80 (*)    Glucose, Bld 102 (*)    All other components within normal limits  HEPATIC FUNCTION PANEL  CBG MONITORING, ED  I-STAT TROPOININ, ED    Imaging Review No results found.   EKG Interpretation   Date/Time:  Wednesday September 23 2014 22:59:50 EDT Ventricular Rate:  48 PR Interval:  206 QRS Duration: 162 QT Interval:  494 QTC Calculation: 441 R Axis:   -49 Text Interpretation:  Sinus bradycardia Left bundle branch block No old  tracing to compare Confirmed by Geoffrey Mankin  MD, Kwanza Cancelliere ([id number]) on 09/23/2014  11:23:35 PM      MDM   Final diagnoses:  Bradycardia  Chronic renal disease, stage 4, severely decreased glomerular filtration rate between 15-29 mL/min/1.73 square meter  Edema  Chronic systolic CHF (congestive heart failure)   I personally performed the services described in this documentation, which was scribed in my presence. The recorded information has been reviewed and is accurate.  81 yo female with 1 day of fatigue, dizziness, n/v, diarrhea 5 days ago, with bradycardia without hypotension, confusion, or other indications for emergent pacing..  H/o CHF with EF of 25% 2/15.  No chest pain.  Plan for labs, cxr.  Expect admission, cardiology evaluation.   1:11 AM No reversible causes of bradycardia noted.  Slightly worsening GFR from baseline.  D/w Dr Jacinto Halim who will admit to obs.  Kara Severin, MD 09/24/14 0111

## 2014-09-23 NOTE — ED Notes (Signed)
Pt is From [facility]. Per EMS, pt reporting generalized weakness, dizziness, nausea and vomiting x 2 days. Upon EMS arrival, pt was bradycardic in the 40's, with no history. Initial BP for EMS was 154/76, but dropped to 122/67. Pt has received 4mg  Zofran for her nausea, which has helped. Pt is a DNR.

## 2014-09-24 ENCOUNTER — Inpatient Hospital Stay (HOSPITAL_COMMUNITY): Payer: Medicare Other

## 2014-09-24 DIAGNOSIS — Z7901 Long term (current) use of anticoagulants: Secondary | ICD-10-CM | POA: Diagnosis not present

## 2014-09-24 DIAGNOSIS — R001 Bradycardia, unspecified: Secondary | ICD-10-CM | POA: Diagnosis present

## 2014-09-24 DIAGNOSIS — I5042 Chronic combined systolic (congestive) and diastolic (congestive) heart failure: Secondary | ICD-10-CM | POA: Diagnosis present

## 2014-09-24 DIAGNOSIS — M1990 Unspecified osteoarthritis, unspecified site: Secondary | ICD-10-CM | POA: Diagnosis present

## 2014-09-24 DIAGNOSIS — I129 Hypertensive chronic kidney disease with stage 1 through stage 4 chronic kidney disease, or unspecified chronic kidney disease: Secondary | ICD-10-CM | POA: Diagnosis present

## 2014-09-24 DIAGNOSIS — Z79899 Other long term (current) drug therapy: Secondary | ICD-10-CM | POA: Diagnosis not present

## 2014-09-24 DIAGNOSIS — Z9981 Dependence on supplemental oxygen: Secondary | ICD-10-CM | POA: Diagnosis not present

## 2014-09-24 DIAGNOSIS — E785 Hyperlipidemia, unspecified: Secondary | ICD-10-CM | POA: Diagnosis present

## 2014-09-24 DIAGNOSIS — I440 Atrioventricular block, first degree: Secondary | ICD-10-CM | POA: Diagnosis present

## 2014-09-24 DIAGNOSIS — I447 Left bundle-branch block, unspecified: Secondary | ICD-10-CM | POA: Diagnosis present

## 2014-09-24 DIAGNOSIS — G8929 Other chronic pain: Secondary | ICD-10-CM | POA: Diagnosis present

## 2014-09-24 DIAGNOSIS — Z515 Encounter for palliative care: Secondary | ICD-10-CM | POA: Diagnosis not present

## 2014-09-24 DIAGNOSIS — Z993 Dependence on wheelchair: Secondary | ICD-10-CM | POA: Diagnosis not present

## 2014-09-24 DIAGNOSIS — Z66 Do not resuscitate: Secondary | ICD-10-CM | POA: Diagnosis present

## 2014-09-24 DIAGNOSIS — N184 Chronic kidney disease, stage 4 (severe): Secondary | ICD-10-CM | POA: Diagnosis present

## 2014-09-24 DIAGNOSIS — N289 Disorder of kidney and ureter, unspecified: Secondary | ICD-10-CM | POA: Diagnosis present

## 2014-09-24 DIAGNOSIS — I480 Paroxysmal atrial fibrillation: Secondary | ICD-10-CM | POA: Diagnosis present

## 2014-09-24 DIAGNOSIS — R627 Adult failure to thrive: Secondary | ICD-10-CM | POA: Diagnosis present

## 2014-09-24 DIAGNOSIS — R609 Edema, unspecified: Secondary | ICD-10-CM | POA: Diagnosis present

## 2014-09-24 DIAGNOSIS — I420 Dilated cardiomyopathy: Secondary | ICD-10-CM | POA: Diagnosis present

## 2014-09-24 DIAGNOSIS — K219 Gastro-esophageal reflux disease without esophagitis: Secondary | ICD-10-CM | POA: Diagnosis present

## 2014-09-24 DIAGNOSIS — R0602 Shortness of breath: Secondary | ICD-10-CM | POA: Diagnosis not present

## 2014-09-24 LAB — BRAIN NATRIURETIC PEPTIDE
B NATRIURETIC PEPTIDE 5: 124.4 pg/mL — AB (ref 0.0–100.0)
B Natriuretic Peptide: 130.4 pg/mL — ABNORMAL HIGH (ref 0.0–100.0)

## 2014-09-24 LAB — COMPREHENSIVE METABOLIC PANEL
ALK PHOS: 95 U/L (ref 38–126)
ALT: 11 U/L — AB (ref 14–54)
AST: 26 U/L (ref 15–41)
Albumin: 3.3 g/dL — ABNORMAL LOW (ref 3.5–5.0)
Anion gap: 11 (ref 5–15)
BUN: 26 mg/dL — ABNORMAL HIGH (ref 6–20)
CO2: 28 mmol/L (ref 22–32)
Calcium: 9.5 mg/dL (ref 8.9–10.3)
Chloride: 100 mmol/L — ABNORMAL LOW (ref 101–111)
Creatinine, Ser: 1.87 mg/dL — ABNORMAL HIGH (ref 0.44–1.00)
GFR calc non Af Amer: 23 mL/min — ABNORMAL LOW (ref >60)
GFR, EST AFRICAN AMERICAN: 26 mL/min — AB (ref >60)
GLUCOSE: 86 mg/dL (ref 65–99)
POTASSIUM: 4.7 mmol/L (ref 3.5–5.1)
SODIUM: 139 mmol/L (ref 135–145)
TOTAL PROTEIN: 7.1 g/dL (ref 6.5–8.1)
Total Bilirubin: 1.2 mg/dL (ref 0.3–1.2)

## 2014-09-24 LAB — BASIC METABOLIC PANEL
Anion gap: 11 (ref 5–15)
BUN: 26 mg/dL — ABNORMAL HIGH (ref 6–20)
CO2: 27 mmol/L (ref 22–32)
CREATININE: 1.71 mg/dL — AB (ref 0.44–1.00)
Calcium: 9.7 mg/dL (ref 8.9–10.3)
Chloride: 101 mmol/L (ref 101–111)
GFR calc Af Amer: 29 mL/min — ABNORMAL LOW (ref >60)
GFR calc non Af Amer: 25 mL/min — ABNORMAL LOW (ref >60)
GLUCOSE: 103 mg/dL — AB (ref 65–99)
Potassium: 4.7 mmol/L (ref 3.5–5.1)
Sodium: 139 mmol/L (ref 135–145)

## 2014-09-24 LAB — HEPATIC FUNCTION PANEL
ALT: 13 U/L — ABNORMAL LOW (ref 14–54)
AST: 18 U/L (ref 15–41)
Albumin: 3.6 g/dL (ref 3.5–5.0)
Alkaline Phosphatase: 103 U/L (ref 38–126)
Total Bilirubin: 0.7 mg/dL (ref 0.3–1.2)
Total Protein: 7.7 g/dL (ref 6.5–8.1)

## 2014-09-24 LAB — TSH: TSH: 1.135 u[IU]/mL (ref 0.350–4.500)

## 2014-09-24 LAB — DIGOXIN LEVEL: Digoxin Level: <0.2 ng/mL — ABNORMAL LOW (ref 0.8–2.0)

## 2014-09-24 LAB — MRSA PCR SCREENING: MRSA by PCR: NEGATIVE

## 2014-09-24 LAB — I-STAT TROPONIN, ED: Troponin i, poc: 0.02 ng/mL (ref 0.00–0.08)

## 2014-09-24 MED ORDER — POLYVINYL ALCOHOL 1.4 % OP SOLN
1.0000 [drp] | Freq: Every day | OPHTHALMIC | Status: DC
  Administered 2014-09-24 – 2014-09-25 (×2): 1 [drp] via OPHTHALMIC
  Filled 2014-09-24: qty 15

## 2014-09-24 MED ORDER — ACETAMINOPHEN 325 MG PO TABS: 650.0000 mg | ORAL_TABLET | ORAL | Status: DC | PRN

## 2014-09-24 MED ORDER — ONDANSETRON HCL 4 MG/2ML IJ SOLN: 4.0000 mg | Freq: Three times a day (TID) | INTRAMUSCULAR | Status: AC | PRN

## 2014-09-24 MED ORDER — TORSEMIDE 20 MG PO TABS
20.0000 mg | ORAL_TABLET | Freq: Two times a day (BID) | ORAL | Status: DC
  Administered 2014-09-24 – 2014-09-25 (×4): 20 mg via ORAL
  Filled 2014-09-24 (×6): qty 1

## 2014-09-24 MED ORDER — SENNOSIDES-DOCUSATE SODIUM 8.6-50 MG PO TABS
2.0000 | ORAL_TABLET | Freq: Every day | ORAL | Status: DC
  Administered 2014-09-24: 2 via ORAL
  Filled 2014-09-24 (×2): qty 2

## 2014-09-24 MED ORDER — MECLIZINE HCL 12.5 MG PO TABS
12.5000 mg | ORAL_TABLET | Freq: Two times a day (BID) | ORAL | Status: DC
  Administered 2014-09-24 – 2014-09-25 (×3): 12.5 mg via ORAL
  Filled 2014-09-24 (×5): qty 1

## 2014-09-24 MED ORDER — LISINOPRIL 2.5 MG PO TABS
2.5000 mg | ORAL_TABLET | Freq: Every day | ORAL | Status: DC
  Administered 2014-09-24 – 2014-09-25 (×2): 2.5 mg via ORAL
  Filled 2014-09-24 (×2): qty 1

## 2014-09-24 MED ORDER — ACETAMINOPHEN 500 MG PO TABS
1000.0000 mg | ORAL_TABLET | Freq: Two times a day (BID) | ORAL | Status: DC
Start: 2014-09-24 — End: 2014-09-25
  Administered 2014-09-24 – 2014-09-25 (×3): 1000 mg via ORAL
  Filled 2014-09-24 (×4): qty 2

## 2014-09-24 MED ORDER — PERFLUTREN LIPID MICROSPHERE
1.0000 mL | INTRAVENOUS | Status: AC | PRN
  Administered 2014-09-24 (×2): 2 mL via INTRAVENOUS
  Filled 2014-09-24: qty 10

## 2014-09-24 MED ORDER — MAGNESIUM HYDROXIDE 400 MG/5ML PO SUSP: 60.0000 mL | Freq: Every day | ORAL | Status: DC | PRN

## 2014-09-24 MED ORDER — SODIUM CHLORIDE 0.9 % IV SOLN: 250.0000 mL | INTRAVENOUS | Status: DC | PRN

## 2014-09-24 MED ORDER — SODIUM CHLORIDE 0.9 % IJ SOLN: 3.0000 mL | INTRAMUSCULAR | Status: DC | PRN

## 2014-09-24 MED ORDER — SERTRALINE HCL 50 MG PO TABS
50.0000 mg | ORAL_TABLET | Freq: Every morning | ORAL | Status: DC
  Administered 2014-09-24 – 2014-09-25 (×2): 50 mg via ORAL
  Filled 2014-09-24 (×2): qty 1

## 2014-09-24 MED ORDER — POTASSIUM CHLORIDE CRYS ER 20 MEQ PO TBCR
40.0000 meq | EXTENDED_RELEASE_TABLET | Freq: Two times a day (BID) | ORAL | Status: DC
  Administered 2014-09-24 (×2): 40 meq via ORAL
  Filled 2014-09-24 (×4): qty 2

## 2014-09-24 MED ORDER — SODIUM CHLORIDE 0.9 % IJ SOLN
3.0000 mL | Freq: Two times a day (BID) | INTRAMUSCULAR | Status: DC
  Administered 2014-09-24 – 2014-09-25 (×3): 3 mL via INTRAVENOUS

## 2014-09-24 MED ORDER — OXYCODONE HCL 5 MG PO TABS: 5.0000 mg | ORAL_TABLET | Freq: Four times a day (QID) | ORAL | Status: DC | PRN

## 2014-09-24 MED ORDER — DIGOXIN 125 MCG PO TABS
0.1250 mg | ORAL_TABLET | ORAL | Status: DC
  Filled 2014-09-24 (×2): qty 1

## 2014-09-24 MED ORDER — ENSURE ENLIVE PO LIQD
237.0000 mL | ORAL | Status: DC
  Administered 2014-09-24 – 2014-09-25 (×2): 237 mL via ORAL

## 2014-09-24 MED ORDER — FLUTICASONE PROPIONATE 50 MCG/ACT NA SUSP
1.0000 | Freq: Every day | NASAL | Status: DC
  Administered 2014-09-24 – 2014-09-25 (×2): 1 via NASAL
  Filled 2014-09-24: qty 16

## 2014-09-24 NOTE — H&P (Signed)
Kara Wilkerson is an 81 y.o. female.   Chief Complaint: fatigue and dizziness HPI: Kara Wilkerson  is a 81 y.o. female  with history of congestive cardiomyopathy, dilated cardiomyopathy with severe left ventricular systolic dysfunction, presents here for generalized weakness and dizziness and fatigue(all these symptoms are chronic and ongoing for the past 2-3 years). She also has h/o paroxysmal A. Fibrillation, orthostatic hypotension, chronic renal insufficiency and consitpation. She lives in a nursing home and her Niece is the POA for health. I have known them over past 4-5 years.  She is also complaining about marked fatigue, shortness of breath that is chronic, but states that the symptoms are same as last OV about 2 months ago . Denies any urinary disturbances, denies orthopnea. States that she is sleeping only with one pillow and has not had any significant difficulty in breathing. Also c/o chronic leg edema and constipation. She was noted to have marked bradycardia on presentation to the ED and now admitted for further evaluation. She is DNR as per my prior discussions with the patient and her niece.   Past Medical History  Diagnosis Date  . CHF (congestive heart failure)   . A-fib   . Hypertension   . Constipation   . Arthritis   . Chronic systolic heart failure   . Dependence on supplemental oxygen   . Other chronic pain   . Contracture of ankle and foot joint   . Muscle weakness (generalized)   . Lack of coordination   . Unspecified essential hypertension   . Edema   . GERD (gastroesophageal reflux disease)   . Hyperlipidemia     Past Surgical History  Procedure Laterality Date  . Right ankle orif    . Fracture surgery      Family History  Problem Relation Age of Onset  . Diabetes Mother   . Hypertension Mother   . Cancer Brother    Social History:  reports that she has never smoked. She has never used smokeless tobacco. She reports that she does not drink alcohol or  use illicit drugs.  Allergies:  Allergies  Allergen Reactions  . Codeine     Review of Systems - Negative except generalized arthritis, weakness and wheel chair bound moslty. leg edema, fatigue and chronic dyspnea. No chest pain or palpitations  Blood pressure 123/48, pulse 43, temperature 98.3 F (36.8 C), temperature source Oral, resp. rate 16, height 5' 3"  (1.6 m), weight 77.656 kg (171 lb 3.2 oz), SpO2 100 %. General appearance: alert, cooperative, appears stated age and no distress Eyes: negative findings: Right eye external ophthamoplegia present with scarring on cornea Neck: no carotid bruit, no JVD, supple, symmetrical, trachea midline and thyroid not enlarged, symmetric, no tenderness/mass/nodules Neck: JVP - normal, carotids 2+= without bruits Resp: clear to auscultation bilaterally Chest wall: no tenderness Cardio: regular rate and rhythm, S1, S2 normal, no murmur, click, rub or gallop and distant heart sounds GI: soft, non-tender; bowel sounds normal; no masses,  no organomegaly Extremities: edema 2 plus pitting bilateral below knee and no ulcers, gangrene or trophic changes Pulses: Normal carotid, femoral. faint popliteal and pedal pulse. Normal capillary fill Skin: Skin color, texture, turgor normal. No rashes or lesions Neurologic: Grossly normal  Results for orders placed or performed during the hospital encounter of 09/23/14 (from the past 48 hour(s))  CBG, ED     Status: None   Collection Time: 09/23/14 11:09 PM  Result Value Ref Range   Glucose-Capillary 96 65 - 99 mg/dL  Brain natriuretic peptide     Status: Abnormal   Collection Time: 09/23/14 11:09 PM  Result Value Ref Range   B Natriuretic Peptide 124.4 (H) 0.0 - 100.0 pg/mL  Hepatic function panel     Status: Abnormal   Collection Time: 09/23/14 11:09 PM  Result Value Ref Range   Total Protein 7.7 6.5 - 8.1 g/dL   Albumin 3.6 3.5 - 5.0 g/dL   AST 18 15 - 41 U/L   ALT 13 (L) 14 - 54 U/L   Alkaline  Phosphatase 103 38 - 126 U/L   Total Bilirubin 0.7 0.3 - 1.2 mg/dL   Bilirubin, Direct <0.1 (L) 0.1 - 0.5 mg/dL   Indirect Bilirubin NOT CALCULATED 0.3 - 0.9 mg/dL  Basic metabolic panel     Status: Abnormal   Collection Time: 09/23/14 11:21 PM  Result Value Ref Range   Sodium 139 135 - 145 mmol/L   Potassium 4.7 3.5 - 5.1 mmol/L   Chloride 101 101 - 111 mmol/L   CO2 27 22 - 32 mmol/L   Glucose, Bld 103 (H) 65 - 99 mg/dL   BUN 26 (H) 6 - 20 mg/dL   Creatinine, Ser 1.71 (H) 0.44 - 1.00 mg/dL   Calcium 9.7 8.9 - 10.3 mg/dL   GFR calc non Af Amer 25 (L) >60 mL/min   GFR calc Af Amer 29 (L) >60 mL/min    Comment: (NOTE) The eGFR has been calculated using the CKD EPI equation. This calculation has not been validated in all clinical situations. eGFR's persistently <60 mL/min signify possible Chronic Kidney Disease.    Anion gap 11 5 - 15  CBC with Differential     Status: Abnormal   Collection Time: 09/23/14 11:21 PM  Result Value Ref Range   WBC 10.6 (H) 4.0 - 10.5 K/uL   RBC 4.07 3.87 - 5.11 MIL/uL   Hemoglobin 12.0 12.0 - 15.0 g/dL   HCT 36.2 36.0 - 46.0 %   MCV 88.9 78.0 - 100.0 fL   MCH 29.5 26.0 - 34.0 pg   MCHC 33.1 30.0 - 36.0 g/dL   RDW 14.0 11.5 - 15.5 %   Platelets 282 150 - 400 K/uL   Neutrophils Relative % 77 43 - 77 %   Neutro Abs 8.1 (H) 1.7 - 7.7 K/uL   Lymphocytes Relative 17 12 - 46 %   Lymphs Abs 1.8 0.7 - 4.0 K/uL   Monocytes Relative 6 3 - 12 %   Monocytes Absolute 0.6 0.1 - 1.0 K/uL   Eosinophils Relative 0 0 - 5 %   Eosinophils Absolute 0.0 0.0 - 0.7 K/uL   Basophils Relative 0 0 - 1 %   Basophils Absolute 0.0 0.0 - 0.1 K/uL  Digoxin level     Status: Abnormal   Collection Time: 09/23/14 11:21 PM  Result Value Ref Range   Digoxin Level <0.2 (L) 0.8 - 2.0 ng/mL    Comment: RESULTS CONFIRMED BY MANUAL DILUTION  I-stat troponin, ED     Status: None   Collection Time: 09/23/14 11:45 PM  Result Value Ref Range   Troponin i, poc 0.02 0.00 - 0.08 ng/mL    Comment 3            Comment: Due to the release kinetics of cTnI, a negative result within the first hours of the onset of symptoms does not rule out myocardial infarction with certainty. If myocardial infarction is still suspected, repeat the test at appropriate intervals.   I-stat  Chem 8, ED     Status: Abnormal   Collection Time: 09/23/14 11:46 PM  Result Value Ref Range   Sodium 139 135 - 145 mmol/L   Potassium 4.7 3.5 - 5.1 mmol/L   Chloride 101 101 - 111 mmol/L   BUN 29 (H) 6 - 20 mg/dL   Creatinine, Ser 1.80 (H) 0.44 - 1.00 mg/dL   Glucose, Bld 102 (H) 65 - 99 mg/dL   Calcium, Ion 1.23 1.13 - 1.30 mmol/L   TCO2 27 0 - 100 mmol/L   Hemoglobin 12.6 12.0 - 15.0 g/dL   HCT 37.0 36.0 - 46.0 %  MRSA PCR Screening     Status: None   Collection Time: 09/24/14  1:45 AM  Result Value Ref Range   MRSA by PCR NEGATIVE NEGATIVE    Comment:        The GeneXpert MRSA Assay (FDA approved for NASAL specimens only), is one component of a comprehensive MRSA colonization surveillance program. It is not intended to diagnose MRSA infection nor to guide or monitor treatment for MRSA infections.    Dg Chest Port 1 View  09/23/2014   CLINICAL DATA:  Acute onset of weakness, bradycardia and nausea. Initial encounter.  EXAM: PORTABLE CHEST - 1 VIEW  COMPARISON:  None.  FINDINGS: The lungs are well-aerated. Minimal left mid lung atelectasis is noted. There is no evidence of pleural effusion or pneumothorax.  The cardiomediastinal silhouette is mildly enlarged. No acute osseous abnormalities are seen.  IMPRESSION: Minimal left midlung atelectasis noted.  Mild cardiomegaly.   Electronically Signed   By: Garald Balding M.D.   On: 09/23/2014 23:59    Labs:   Lab Results  Component Value Date   WBC 10.6* 09/23/2014   HGB 12.6 09/23/2014   HCT 37.0 09/23/2014   MCV 88.9 09/23/2014   PLT 282 09/23/2014    Recent Labs Lab 09/23/14 2309  09/23/14 2321 09/23/14 2346  NA  --   < > 139 139   K  --   < > 4.7 4.7  CL  --   < > 101 101  CO2  --   --  27  --   BUN  --   < > 26* 29*  CREATININE  --   < > 1.71* 1.80*  CALCIUM  --   --  9.7  --   PROT 7.7  --   --   --   BILITOT 0.7  --   --   --   ALKPHOS 103  --   --   --   ALT 13*  --   --   --   AST 18  --   --   --   GLUCOSE  --   < > 103* 102*  < > = values in this interval not displayed.  Lipid Panel  No results found for: CHOL, TRIG, HDL, CHOLHDL, VLDL, LDLCALC  BNP (last 3 results)  Recent Labs  09/23/14 2309  BNP 124.4*    ProBNP (last 3 results) No results for input(s): PROBNP in the last 8760 hours.  HEMOGLOBIN A1C No results found for: HGBA1C, MPG  Cardiac Panel (last 3 results) No results for input(s): CKTOTAL, CKMB, TROPONINI, RELINDX in the last 8760 hours.  No results found for: CKTOTAL, CKMB, CKMBINDEX, TROPONINI   TSH  Recent Labs  12/30/13 05/22/14  TSH 1.70 2.07    EKG: Marked S Brady @ 43/min with 1st degree AVB. LBBB. Unchanged from prior EKG.  Echocardiogram 05/19/2013: Normal LV size, severely reduced ejection fraction of 20-25% with diffuse hypokinesis, anterolateral akinesis, not exclude mid to distal anterior thinning and aneurysm formation. Mild mitral regurgitation, moderate left atrial enlargement.  Medications Prior to Admission  Medication Sig Dispense Refill  . acetaminophen (TYLENOL) 500 MG tablet Take 1,000 mg by mouth 2 (two) times daily.    Marland Kitchen amiodarone (PACERONE) 200 MG tablet Take 100 mg by mouth daily. For A-Fib    . calcium-vitamin D (OSCAL WITH D) 250-125 MG-UNIT per tablet Take 1 tablet by mouth 3 (three) times daily.    . cholecalciferol (VITAMIN D) 1000 UNITS tablet Take 2,000 Units by mouth daily.    . diclofenac sodium (VOLTAREN) 1 % GEL Apply 4 g topically 2 (two) times daily. To both knees    . digoxin (LANOXIN) 0.125 MG tablet Take 0.125 mg by mouth daily. Give 1 tablet by mouth daily except  Saturday and Sunday.For afib    . ferrous sulfate 325 (65 FE)  MG tablet Take 325 mg by mouth daily with breakfast.    . fluticasone (FLONASE) 50 MCG/ACT nasal spray Place 2 sprays into the nose daily. For allergies    . glucosamine-chondroitin 500-400 MG tablet Take 2 tablets by mouth 2 (two) times daily. For osteoarthritis    . guaiFENesin (ROBITUSSIN) 100 MG/5ML SOLN Take 10 mLs by mouth every 6 (six) hours as needed for cough or to loosen phlegm.    . lidocaine (LIDODERM) 5 % Place 1 patch onto the skin daily. Remove & Discard patch within 12 hours to left shoulder    . lisinopril (PRINIVIL,ZESTRIL) 2.5 MG tablet Take 2.5 mg by mouth daily. Hold for SBP less than 100.    . magnesium hydroxide (MILK OF MAGNESIA) 400 MG/5ML suspension Take 60 mLs by mouth daily as needed for mild constipation or moderate constipation.    . meclizine (ANTIVERT) 12.5 MG tablet Take 12.5 mg by mouth 2 (two) times daily. For dizziness    . metoprolol succinate (TOPROL-XL) 25 MG 24 hr tablet Take 12.5 mg by mouth daily. Hold for SBP less than 100. For HTN    . oxyCODONE (OXY IR/ROXICODONE) 5 MG immediate release tablet Take one tablet by mouth every four hours as needed for pain 180 tablet 0  . Polyethyl Glycol-Propyl Glycol (SYSTANE) 0.4-0.3 % SOLN Apply 1 drop to eye daily. Administer one drop in to each eye once daily for eye irritation.    . polyethylene glycol (MIRALAX / GLYCOLAX) packet Take 17 g by mouth daily. For constipation    . potassium chloride SA (K-DUR,KLOR-CON) 20 MEQ tablet Take 40 mEq by mouth 2 (two) times daily.    . Rivaroxaban (XARELTO) 15 MG TABS tablet Take 15 mg by mouth daily with supper. Give 1 tablet by mouth daily after evening meal for A-Fib.    Marland Kitchen sennosides-docusate sodium (SENOKOT-S) 8.6-50 MG tablet Take 2 tablets by mouth at bedtime.     . sertraline (ZOLOFT) 50 MG tablet Take 50 mg by mouth every morning. For depression    . torsemide (DEMADEX) 20 MG tablet Take 20 mg by mouth 2 (two) times daily. For congestive heart failure        Current  facility-administered medications:  .  0.9 %  sodium chloride infusion, 250 mL, Intravenous, PRN, Adrian Prows, MD .  acetaminophen (TYLENOL) tablet 1,000 mg, 1,000 mg, Oral, BID, Adrian Prows, MD .  acetaminophen (TYLENOL) tablet 650 mg, 650 mg, Oral, Q4H PRN, Adrian Prows, MD .  digoxin (LANOXIN) tablet 0.125 mg, 0.125 mg, Oral, Daily, Adrian Prows, MD .  fluticasone (FLONASE) 50 MCG/ACT nasal spray 1 spray, 1 spray, Each Nare, Daily, Adrian Prows, MD .  lisinopril (PRINIVIL,ZESTRIL) tablet 2.5 mg, 2.5 mg, Oral, Daily, Adrian Prows, MD .  magnesium hydroxide (MILK OF MAGNESIA) suspension 60 mL, 60 mL, Oral, Daily PRN, Adrian Prows, MD .  meclizine (ANTIVERT) tablet 12.5 mg, 12.5 mg, Oral, BID, Adrian Prows, MD .  ondansetron [facility]) injection 4 mg, 4 mg, Intravenous, Q8H PRN, Linton Flemings, MD .  oxyCODONE (Oxy IR/ROXICODONE) immediate release tablet 5 mg, 5 mg, Oral, Q6H PRN, Adrian Prows, MD .  Polyethyl Glycol-Propyl Glycol 0.4-0.3 % SOLN 1 drop, 1 drop, Ophthalmic, Daily, Adrian Prows, MD .  potassium chloride SA (K-DUR,KLOR-CON) CR tablet 40 mEq, 40 mEq, Oral, BID, Adrian Prows, MD .  sennosides-docusate sodium (SENOKOT-S) 8.6-50 MG tablet 2 tablet, 2 tablet, Oral, QHS, Adrian Prows, MD .  sertraline (ZOLOFT) tablet 50 mg, 50 mg, Oral, q morning - 10a, Adrian Prows, MD .  sodium chloride 0.9 % injection 3 mL, 3 mL, Intravenous, Q12H, Adrian Prows, MD .  sodium chloride 0.9 % injection 3 mL, 3 mL, Intravenous, PRN, Adrian Prows, MD .  torsemide (DEMADEX) tablet 20 mg, 20 mg, Oral, BID, Adrian Prows, MD  Assessment/Plan 1. Symptomatic bradycardia with dizziness, fatigue. 2. Chronic renal insufficiency, stage III 3. Dilated Cardiomyopathy with chronic systolic and diastolic heart failure. 4. P. A. Fib. On Xarelto. CHA2DS2-VASCScore: Risk Score  5,  Yearly risk of stroke  6.7. Recommendation: ASA No/Anticoagulation Yes 5. Failure to thrive in adult. 6. Chronic fatigue. 7. LBBB  Rec: Hold Amiodaorone, digoxin and Metoprolol. Watch for  HR and if improved discharge in morning. Her baseline HR is 45-50. Nephew present at bedside. She does not want ICD or pacemaker. Given her advanced age, medical issues, would not recommend pacemaker or ICD implantation, presently not in acute CHF.  Adrian Prows, MD 09/24/2014, 6:33 AM [facility]. [place] Pager: [phone] Office: [phone] If no answer: Cell:  [phone]

## 2014-09-24 NOTE — Progress Notes (Signed)
Kara Wilkerson [id number] Admission Data: 09/24/2014 1:59 AM Attending Provider: Yates Decamp, MD OEH:[id number],GNOIBBC Kellie Shropshire, MD Code Status: DNR   Kara Wilkerson is a 81 y.o. female patient admitted from ED:  -No acute distress noted.  -No complaints of shortness of breath.  -No complaints of chest pain.   Cardiac Monitoring: Box # 25 in place. Cardiac monitor yields:sinus bradycardia.  Blood pressure 128/50, pulse 44, temperature 97.6 F (36.4 C), temperature source Oral, resp. rate 10, SpO2 97 %.   IV Fluids:  IV in place, occlusive dsg intact without redness, IV cath hand left, condition patent and no redness none.   Allergies:  Codeine  Past Medical History:   has a past medical history of CHF (congestive heart failure); A-fib; Hypertension; Constipation; Arthritis; Chronic systolic heart failure; Dependence on supplemental oxygen; Other chronic pain; Contracture of ankle and foot joint; Muscle weakness (generalized); Lack of coordination; Unspecified essential hypertension; Edema; GERD (gastroesophageal reflux disease); and Hyperlipidemia.  Past Surgical History:   has past surgical history that includes right ankle orif and Fracture surgery.  Social History:   reports that she has never smoked. She has never used smokeless tobacco. She reports that she does not drink alcohol or use illicit drugs.  Skin: NSI  Patient/Family orientated to room. Information packet given to patient/family. Admission inpatient armband information verified with patient/family to include name and date of birth and placed on patient arm. Side rails up x 2, fall assessment and education completed with patient/family. Patient/family able to verbalize understanding of risk associated with falls and verbalized understanding to call for assistance before getting out of bed. Call light within reach. Patient/family able to voice and demonstrate understanding of unit orientation instructions.

## 2014-09-24 NOTE — Progress Notes (Signed)
Initial Nutrition Assessment  DOCUMENTATION CODES:  Obesity unspecified  INTERVENTION:  Ensure Enlive (each supplement provides 350kcal and 20 grams of protein) once daily  NUTRITION DIAGNOSIS:  Predicted suboptimal nutrient intake related to nausea, vomiting as evidenced by per patient/family report.   GOAL:  Patient will meet greater than or equal to 90% of their needs   MONITOR:  PO intake, Supplement acceptance, Weight trends, Skin, I & O's  REASON FOR ASSESSMENT:  Malnutrition Screening Tool    ASSESSMENT: 81 y.o. female resident of [facility] with past medical history of CHF, a-fib, HTN, HLD who presents to the Emergency Department complaining of 2 days of generalized weakness, nausea, and dizziness. Patient also notes two episodes of vomiting on day of admission; she states she has not been eating well.   Pt asleep at time of visit. Per chart, pt was following a cardiac diet PTA but, has not been eating well. No evidence of weight loss per chart and pt appears well-nourished in face/arms but, she also has fluid/edema which may be masking weight loss. Per nursing notes, pt ate 25% of breakfast this morning.  No nutritional supplements listed in paper chart PTA medication list.   Labs: low GFR, elevated BUN/creatinine  Height:  Ht Readings from Last 1 Encounters:  09/24/14 5\' 3"  (1.6 m)    Weight:  Wt Readings from Last 1 Encounters:  09/24/14 171 lb 3.2 oz (77.656 kg)    Ideal Body Weight:  52.7 kg  Wt Readings from Last 10 Encounters:  09/24/14 171 lb 3.2 oz (77.656 kg)  01/09/14 168 lb 3.2 oz (76.295 kg)  12/27/12 179 lb 6.4 oz (81.375 kg)  12/16/12 172 lb 12.8 oz (78.382 kg)  10/16/12 182 lb 6.4 oz (82.736 kg)  10/14/12 182 lb 6.4 oz (82.736 kg)  08/26/12 179 lb (81.194 kg)    BMI:  Body mass index is 30.33 kg/(m^2).  Estimated Nutritional Needs:  Kcal:  1500-1700  Protein:  85-95 grams  Fluid:  1.5-1.7 L/day  Skin:   Reviewed, no issues; +2 RLE and LLE edema  Diet Order:  Diet Heart Room service appropriate?: Yes; Fluid consistency:: Thin  EDUCATION NEEDS:  No education needs identified at this time   Intake/Output Summary (Last 24 hours) at 09/24/14 1127 Last data filed at 09/24/14 0956  Gross per 24 hour  Intake    120 ml  Output      0 ml  Net    120 ml    Last BM:  6/14  Ian Malkin RD, LDN Inpatient Clinical Dietitian Pager: [phone] After Hours Pager: [phone]

## 2014-09-24 NOTE — Progress Notes (Signed)
Received pt report from Cricket,RN-ED.

## 2014-09-24 NOTE — Progress Notes (Signed)
Paged Ganji,MD upon pt arrival on unit. Ordered for a heart healthy diet. New order carried out. We will continue to monitor.

## 2014-09-24 NOTE — Progress Notes (Signed)
CCMD notified this RN that pt HR 38 sustaining. Jacinto Halim, MD made aware and relayed pt tele status with 1st deg HB. No new orders made but continue to monitor pt.

## 2014-09-24 NOTE — Progress Notes (Signed)
  Echocardiogram 2D Echocardiogram with Definity has been performed.  [name] 09/24/2014, 1:25 PM

## 2014-09-24 NOTE — Clinical Social Work Note (Signed)
Clinical Social Work Assessment  Patient Details  Name: Kara Wilkerson MRN: [id number] Date of Birth: [birth year]  Date of referral:  09/24/14               Reason for consult:  Facility Placement (Return to SNF)                Permission sought to share information with:  Family Supports, Oceanographer granted to share information::  Yes, Verbal Permission Granted  Name::     Kara Wilkerson , Kara Wilkerson ([facility], Neice [employer])  Agency::     Relationship::     Contact Information:     Housing/Transportation Living arrangements for the past 2 months:  Skilled Building surveyor of Information:  Patient Patient Interpreter Needed:  None Criminal Activity/Legal Involvement Pertinent to Current Situation/Hospitalization:  No - Comment as needed Significant Relationships:  Other Family Members (Neice  Kara Wilkerson  [phone]) Lives with:  Facility Resident (Long term Care) Do you feel safe going back to the place where you live?  Yes Need for family participation in patient care:  Yes (Comment)  Care giving concerns:  Patient states that she is feeling better and that her "heart feels better now." She feels that she will soon be able to return to SNF.   Social Worker assessment / plan:  81 year old female- long term resident of [facility]. She states that she no long has a "home" to return to and that due to her medical conditions- she requires around the clock nursing care.  "They take good care of me- I plan to live at [name] for the rest of my life."  CSW spoke to [facility]- Insurance claims handler of Kara Wilkerson. She indicated that they would accept patient back to facility when medically stable.  Fl2 completed and placed on chart for MD's signature.   Employment status:  Retired Health and safety inspector:  Medicare PT Recommendations:  Not assessed at this time Information / Referral to community resources:  Skilled Nursing  Facility  Patient/Family's Response to care:  Patient alone in hospital room. Noted to be resting quietly. She states that she feels she is getting good care and denies any concerns or questions at this time.  Patient/Family's Understanding of and Emotional Response to Diagnosis, Current Treatment, and Prognosis: Patient reports that while she still feels weak and tired - she is feeling better.  She is hoping to be able to return to [facility] soon. Patient is accepting of her multiple medical conditions and feels that they have been managed well by medical staff.  Patient indicated that her niece Kara helps her with necessary medical and financial issues at the SNF and hospital and that her niece supports her return to [name] when medically stable.    Emotional Assessment Appearance:  Appears stated age Attitude/Demeanor/Rapport:   (Calm, smiling, relaxed, cooperative) Affect (typically observed):  Calm, Quiet, Pleasant, Appropriate Orientation:  Oriented to Self, Oriented to Place, Oriented to  Time, Oriented to Situation Alcohol / Substance use:  Never Used Psych involvement (Current and /or in the community):  No (Comment)  Discharge Needs  Concerns to be addressed:  Care Coordination Readmission within the last 30 days:  No Current discharge risk:  None Barriers to Discharge:  No Barriers Identified   Darylene Price, LCSW 09/24/2014,  4:45 PM

## 2014-09-25 DIAGNOSIS — Z515 Encounter for palliative care: Secondary | ICD-10-CM | POA: Insufficient documentation

## 2014-09-25 DIAGNOSIS — R0602 Shortness of breath: Secondary | ICD-10-CM

## 2014-09-25 LAB — BASIC METABOLIC PANEL
ANION GAP: 8 (ref 5–15)
BUN: 37 mg/dL — ABNORMAL HIGH (ref 6–20)
CHLORIDE: 102 mmol/L (ref 101–111)
CO2: 28 mmol/L (ref 22–32)
Calcium: 9.4 mg/dL (ref 8.9–10.3)
Creatinine, Ser: 2.37 mg/dL — ABNORMAL HIGH (ref 0.44–1.00)
GFR calc non Af Amer: 17 mL/min — ABNORMAL LOW (ref >60)
GFR, EST AFRICAN AMERICAN: 20 mL/min — AB (ref >60)
Glucose, Bld: 87 mg/dL (ref 65–99)
Potassium: 5.1 mmol/L (ref 3.5–5.1)
Sodium: 138 mmol/L (ref 135–145)

## 2014-09-25 MED ORDER — POTASSIUM CHLORIDE CRYS ER 20 MEQ PO TBCR: 40.0000 meq | EXTENDED_RELEASE_TABLET | Freq: Once | ORAL | Status: DC

## 2014-09-25 MED ORDER — POTASSIUM CHLORIDE CRYS ER 20 MEQ PO TBCR: 40.0000 meq | EXTENDED_RELEASE_TABLET | Freq: Every day | ORAL | Status: DC

## 2014-09-25 MED ORDER — ENSURE ENLIVE PO LIQD: 237.0000 mL | ORAL | Status: AC

## 2014-09-25 NOTE — Progress Notes (Signed)
CSW (Clinical Child psychotherapist) prepared pt dc packet and placed with shadow chart. CSW arranged non-emergent ambulance transport. Pt, pt nurse, and facility informed. Pt declined for CSW to call family to notify. CSW signing off.  Latica Hohmann, LCSWA [phone]

## 2014-09-25 NOTE — Progress Notes (Signed)
CSW (Clinical Child psychotherapist) notified of plan for [employer] today. CSW notified facility. Will await dc summary and then will help coordinate with family and facility.  Ramiya Delahunty, LCSWA [phone]

## 2014-09-25 NOTE — Discharge Summary (Signed)
Physician Discharge Summary  [name] MRN: [id number] DOB/AGE: 08-22-1922 81 y.o.  Admit date: 09/23/2014 Discharge date: 09/25/2014  Primary Discharge Diagnosis 1. Endstage dilated Cardiomyopathy 1. Symptomatic bradycardia with dizziness, fatigue. 2. Acute on Chronic renal insufficiency, stage III - IV creatinine increased from 1.87 to 2.37 3. Chronic systolic and diastolic heart failure: no significant change on echocardiogram 4. P. A. Fib. On Xarelto. CHA2DS2-VASCScore: Risk Score 5, Yearly risk of stroke 6.7. Recommendation: ASA No/Anticoagulation Yes 5. Failure to thrive in adult. 6. Chronic fatigue. 7. LBBB  Significant Diagnostic Studies: EKG: Marked S Brady @ 43/min with 1st degree AVB. LBBB. Unchanged from prior EKG.  Tele: SR 1st degree AV Block @ 45-50/min.  Echocardiogram 09/24/2014: Left ventricle: The cavity size was at the upper limits of normal. Systolic function was severely reduced. The estimated ejection fraction was 25%, in the range of 20% to 25%. Diffuse hypokinesis with distinct regional wall motion abnormalities. Akinesis of the anterolateral myocardium; consistent with infarction. Mild aneurysmal formation with anterior wall thinning in mid to distalwall cannot be excluded. Features are consistent with a pseudonormal left ventricular filling pattern, with concomitant abnormal relaxation and increased filling pressure (grade 2 diastolic dysfunction). Doppler parameters are consistent with both elevated ventricular end-diastolic filling pressure and elevated left atrial filling pressure. Mitral valve: Calcified annulus. Mild regurgitation. Left atrium: The atrium was moderately dilated. Atrial septum: No defect or patent foramen ovale was identified.  Hospital Course:  Patient admitted to the hospital when she complained of fatigue, dizziness, was found to have markedly sinus bradycardia, first-degree AV block, ventricular rate of 30 bpm.  She was  admitted to the hospital and initial thought of ICD/pacemaker was felt not to be appropriate in a patient with multiple medical comorbidities and advanced age and patient preference.  Her amiodarone and digoxin and beta blockers were held.  Heart rate improved to 45-50 bpm, patient throughout the hospitalization remained stable without any clinical evidence of congestive heart failure.  Her serum creatinine did increase on the day of discharge, however patient stated that she feels well, also her niece also expressed that she is more comfortable in the nursing home, we discussed regarding hospice care/palliative care, they were all in agreement.  Palliative care consultation was performed, she'll be followed up in the outpatient basis with the palliative care team.  Recommendations on discharge: I will see her back in the office in 2-3 weeks.  I will evaluate her heart rate response.  She is DNR.  Discharge Exam: Blood pressure 92/40, pulse 54, temperature 97.8 F (36.6 C), temperature source Oral, resp. rate 16, height  (1.6 m), weight 77.747 kg (171 lb 6.4 oz), SpO2 100 %.  General appearance: alert, cooperative, appears stated age and no distress Eyes: negative findings: Right eye external ophthamoplegia present with scarring on cornea Neck: no carotid bruit, no JVD, supple, symmetrical, trachea midline and thyroid not enlarged, symmetric, no tenderness/mass/nodules Resp: clear to auscultation bilaterally Chest wall: no tenderness Cardio: regular rate and rhythm, S1, S2 normal, no murmur, click, rub or gallop and distant heart sounds GI: soft, non-tender; bowel sounds normal; no masses, no organomegaly Extremities: edema 1+ pitting bilateral below knee and no ulcers, gangrene or trophic changes, support stockings in place Pulses: Normal carotid, femoral. faint popliteal and pedal pulse. Normal capillary fill Skin: Skin color, texture, turgor normal. No rashes or lesions Neurologic:  Grossly normal  Labs:   Lab Results  Component Value Date   WBC 10.6* 09/23/2014  HGB 12.6 09/23/2014   HCT 37.0 09/23/2014   MCV 88.9 09/23/2014   PLT 282 09/23/2014    Recent Labs Lab 09/24/14 0550 09/25/14 0323  NA 139 138  K 4.7 5.1  CL 100* 102  CO2 28 28  BUN 26* 37*  CREATININE 1.87* 2.37*  CALCIUM 9.5 9.4  PROT 7.1  --   BILITOT 1.2  --   ALKPHOS 95  --   ALT 11*  --   AST 26  --   GLUCOSE 86 87    Lipid Panel  No results found for: CHOL, TRIG, HDL, CHOLHDL, VLDL, LDLCALC  BNP (last 3 results)  Recent Labs  09/23/14 2309 09/24/14 0819  BNP 124.4* 130.4*    ProBNP (last 3 results) No results for input(s): PROBNP in the last 8760 hours.  HEMOGLOBIN A1C No results found for: HGBA1C, MPG  Cardiac Panel (last 3 results) No results for input(s): CKTOTAL, CKMB, TROPONINI, RELINDX in the last 8760 hours.  No results found for: CKTOTAL, CKMB, CKMBINDEX, TROPONINI   TSH  Recent Labs  12/30/13 05/22/14 09/24/14 0819  TSH 1.70 2.07 1.135   Radiology: Dg Chest Port 1 View  09/23/2014   CLINICAL DATA:  Acute onset of weakness, bradycardia and nausea. Initial encounter.  EXAM: PORTABLE CHEST - 1 VIEW  COMPARISON:  None.  FINDINGS: The lungs are well-aerated. Minimal left mid lung atelectasis is noted. There is no evidence of pleural effusion or pneumothorax.  The cardiomediastinal silhouette is mildly enlarged. No acute osseous abnormalities are seen.  IMPRESSION: Minimal left midlung atelectasis noted.  Mild cardiomegaly.   Electronically Signed   By: Roanna Raider M.D.   On: 09/23/2014 23:59      FOLLOW UP PLANS AND APPOINTMENTS    Medication List    STOP taking these medications        amiodarone 200 MG tablet  Commonly known as:  PACERONE     cholecalciferol 1000 UNITS tablet  Commonly known as:  VITAMIN D     digoxin 0.125 MG tablet  Commonly known as:  LANOXIN     metoprolol succinate 25 MG 24 hr tablet  Commonly known as:   TOPROL-XL      TAKE these medications        acetaminophen 500 MG tablet  Commonly known as:  TYLENOL  Take 1,000 mg by mouth 2 (two) times daily.     calcium-vitamin D 250-125 MG-UNIT per tablet  Commonly known as:  OSCAL WITH D  Take 1 tablet by mouth 3 (three) times daily.     diclofenac sodium 1 % Gel  Commonly known as:  VOLTAREN  Apply 4 g topically 2 (two) times daily. To both knees     feeding supplement (ENSURE ENLIVE) Liqd  Take 237 mLs by mouth daily.     ferrous sulfate 325 (65 FE) MG tablet  Take 325 mg by mouth daily with breakfast.     fluticasone 50 MCG/ACT nasal spray  Commonly known as:  FLONASE  Place 2 sprays into the nose daily. For allergies     glucosamine-chondroitin 500-400 MG tablet  Take 2 tablets by mouth 2 (two) times daily. For osteoarthritis     guaiFENesin 100 MG/5ML Soln  Commonly known as:  ROBITUSSIN  Take 10 mLs by mouth every 6 (six) hours as needed for cough or to loosen phlegm.     lidocaine 5 %  Commonly known as:  LIDODERM  Place 1 patch onto the skin daily. Remove &  Discard patch within 12 hours to left shoulder     lisinopril 2.5 MG tablet  Commonly known as:  PRINIVIL,ZESTRIL  Take 2.5 mg by mouth daily. Hold for SBP less than 100.     magnesium hydroxide 400 MG/5ML suspension  Commonly known as:  MILK OF MAGNESIA  Take 60 mLs by mouth daily as needed for mild constipation or moderate constipation.     meclizine 12.5 MG tablet  Commonly known as:  ANTIVERT  Take 12.5 mg by mouth 2 (two) times daily as needed for dizziness. For dizziness     oxyCODONE 5 MG immediate release tablet  Commonly known as:  Oxy IR/ROXICODONE  Take one tablet by mouth every four hours as needed for pain     polyethylene glycol packet  Commonly known as:  MIRALAX / GLYCOLAX  Take 17 g by mouth daily. For constipation     potassium chloride SA 20 MEQ tablet  Commonly known as:  K-DUR,KLOR-CON  Take 2 tablets (40 mEq total) by mouth once.      Rivaroxaban 15 MG Tabs tablet  Commonly known as:  XARELTO  Take 15 mg by mouth daily with supper. Give 1 tablet by mouth daily after evening meal for A-Fib.     sennosides-docusate sodium 8.6-50 MG tablet  Commonly known as:  SENOKOT-S  Take 2 tablets by mouth at bedtime.     sertraline 50 MG tablet  Commonly known as:  ZOLOFT  Take 50 mg by mouth every morning. For depression     SYSTANE 0.4-0.3 % Soln  Generic drug:  Polyethyl Glycol-Propyl Glycol  Apply 1 drop to eye daily. Administer one drop in to each eye once daily for eye irritation.     torsemide 20 MG tablet  Commonly known as:  DEMADEX  Take 20 mg by mouth 2 (two) times daily. For congestive heart failure          Yates Decamp, MD 09/25/2014, 2:55 PM  Pager: [phone] Office: [phone] If no answer: [phone]

## 2014-09-25 NOTE — Progress Notes (Signed)
Subjective:  Pt sleeping but easily aroused.  Reports feeling well and is ready to go home.  HR per monitor 45bpm. No family present during exam.  Objective:  Vital Signs in the last 24 hours: Temp:  [97.4 F (36.3 C)-97.9 F (36.6 C)] 97.9 F (36.6 C) (06/17 0601) Pulse Rate:  [45-94] 45 (06/17 0601) Resp:  [16-17] 17 (06/17 0601) BP: (102-119)/(34-54) 119/45 mmHg (06/17 0601) SpO2:  [98 %-100 %] 100 % (06/17 0601) Weight:  [77.747 kg (171 lb 6.4 oz)-79.561 kg (175 lb 6.4 oz)] 77.747 kg (171 lb 6.4 oz) (06/17 0601)  Intake/Output from previous day: 06/16 0701 - 06/17 0700 In: 600 [P.O.:600] Out: 400 [Urine:400]  Physical Exam:  General appearance: alert, cooperative, appears stated age and no distress Eyes: negative findings: Right eye external ophthamoplegia present with scarring on cornea Neck: no carotid bruit, no JVD, supple, symmetrical, trachea midline and thyroid not enlarged, symmetric, no tenderness/mass/nodules Resp: clear to auscultation bilaterally Chest wall: no tenderness Cardio: regular rate and rhythm, S1, S2 normal, no murmur, click, rub or gallop and distant heart sounds GI: soft, non-tender; bowel sounds normal; no masses, no organomegaly Extremities: edema 1+ pitting bilateral below knee and no ulcers, gangrene or trophic changes, support stockings in place Pulses: Normal carotid, femoral. faint popliteal and pedal pulse. Normal capillary fill Skin: Skin color, texture, turgor normal. No rashes or lesions Neurologic: Grossly normal   Lab Results: BMP  Recent Labs  09/23/14 2321 09/23/14 2346 09/24/14 0550 09/25/14 0323  NA 139 139 139 138  K 4.7 4.7 4.7 5.1  CL 101 101 100* 102  CO2 27  --  28 28  GLUCOSE 103* 102* 86 87  BUN 26* 29* 26* 37*  CREATININE 1.71* 1.80* 1.87* 2.37*  CALCIUM 9.7  --  9.5 9.4  GFRNONAA 25*  --  23* 17*  GFRAA 29*  --  26* 20*    CBC  Recent Labs Lab 09/23/14 2321 09/23/14 2346  WBC 10.6*  --   RBC 4.07  --    HGB 12.0 12.6  HCT 36.2 37.0  PLT 282  --   MCV 88.9  --   MCH 29.5  --   MCHC 33.1  --   RDW 14.0  --   LYMPHSABS 1.8  --   MONOABS 0.6  --   EOSABS 0.0  --   BASOSABS 0.0  --     HEMOGLOBIN A1C No results found for: HGBA1C, MPG  Cardiac Panel (last 3 results) No results for input(s): CKTOTAL, CKMB, TROPONINI, RELINDX in the last 8760 hours.  BNP (last 3 results) No results for input(s): PROBNP in the last 8760 hours.  TSH  Recent Labs  12/30/13 05/22/14 09/24/14 0819  TSH 1.70 2.07 1.135    CHOLESTEROL No results for input(s): CHOL in the last 8760 hours.  Hepatic Function Panel  Recent Labs  05/22/14 09/23/14 2309 09/24/14 0550  PROT  --  7.7 7.1  ALBUMIN  --  3.6 3.3*  AST 14 18 26   ALT 10 13* 11*  ALKPHOS 87 103 95  BILITOT  --  0.7 1.2  BILIDIR  --  <0.1*  --   IBILI  --  NOT CALCULATED  --     Imaging: Dg Chest Port 1 View  09/23/2014   CLINICAL DATA:  Acute onset of weakness, bradycardia and nausea. Initial encounter.  EXAM: PORTABLE CHEST - 1 VIEW  COMPARISON:  None.  FINDINGS: The lungs are well-aerated. Minimal left mid lung  atelectasis is noted. There is no evidence of pleural effusion or pneumothorax.  The cardiomediastinal silhouette is mildly enlarged. No acute osseous abnormalities are seen.  IMPRESSION: Minimal left midlung atelectasis noted.  Mild cardiomegaly.   Electronically Signed   By: Roanna Raider M.D.   On: 09/23/2014 23:59   Cardiac Studies:  EKG: Marked S Brady @ 43/min with 1st degree AVB. LBBB. Unchanged from prior EKG.  Tele: SR 1st degree AV Block @ 45-50/min.  Echocardiogram 09/24/2014: Left ventricle: The cavity size was at the upper limits of normal. Systolic function was severely reduced. The estimated ejection fraction was 25%, in the range of 20% to 25%. Diffuse hypokinesis with distinct regional wall motion abnormalities. Akinesis of the anterolateral myocardium; consistent with infarction. Mild aneurysmal formation  with anterior wall thinning in mid to distalwall cannot be excluded. Features are consistent with a pseudonormal left ventricular filling pattern, with concomitant abnormal relaxation and increased filling pressure (grade 2 diastolic dysfunction). Doppler parameters are consistent with both elevated ventricular end-diastolic filling pressure and elevated left atrial filling pressure. Mitral valve: Calcified annulus. Mild regurgitation. Left atrium: The atrium was moderately dilated. Atrial septum: No defect or patent foramen ovale was identified.  Assessment/Plan:  1. Endstage dilated Cardiomyopathy 1. Symptomatic bradycardia with dizziness, fatigue. 2. Acute on Chronic renal insufficiency, stage III - IV creatinine increased from 1.87 to 2.37 3. Chronic systolic and diastolic heart failure: no significant change on echocardiogram 4. P. A. Fib. On Xarelto. CHA2DS2-VASCScore: Risk Score 5, Yearly risk of stroke 6.7. Recommendation: ASA No/Anticoagulation Yes 5. Failure to thrive in adult. 6. Chronic fatigue. 7. LBBB  Recommendation: I have discussed with the patient's power of attorney for health, her Neice and both agree that patient needs comfort measures only and do not want to pursue any aggressive measures.  She appears to be not in any acute decompensated heart failure, worsening renal function is probably a manifestation of end-stage cardiomyopathy with decreased cardiac output.  After long discussions, we have decided on palliative care, she'll be discharged back to the nursing home today after palliative care consultation.  Patient prefers to be discharged.  Also we are not doing anything active in the hospital for now.  If her mental status deteriorates, if her medical condition gets worse, she will be considered for palliative care/hospice care.   Yates Decamp, MD, [facility]  09/25/2014, 8:41 AM [facility] Pager: [phone] Office: [phone]

## 2014-09-25 NOTE — Progress Notes (Signed)
Pt has orders to be discharged. Discharge instructions given and pt has no additional questions at this time. Medication regimen reviewed and pt educated. Pt verbalized understanding and has no additional questions. Telemetry box removed. IV removed and site in good condition. Pt stable and waiting for transportation. Report called to [employer].  Jilda Panda RN

## 2014-09-25 NOTE — Consult Note (Signed)
Consultation Note Date: 09/25/2014   Patient Name: Kara Wilkerson  DOB: Mar 23, 1923  MRN: [id number]  Age / Sex: 81 y.o., female   PCP: No primary care provider on file. Referring Physician: Yates Decamp, MD  Reason for Consultation: Inpatient hospice referral  Palliative Care Assessment and Plan Summary of Established Goals of Care and Medical Treatment Preferences   Clinical Assessment/Narrative: Pt is a 81 yo female with end stage cardiomyopathy, chronic systolic and diastolic heart failure, acute renal insufficiency stage 3-4, LBBB, now bradycardic. Pt and pt's HCPOA, have discussed options and pt does not wish to pursue pacemaker or other aggressive interventions. She desires to return to [facility] where she states she has resided for 2 years. Pt is already DNR and has discussed comfort care plan except for discussion of what tnhospice may provide.  Contacts/Participants in Discussion: Primary Decision Maker: niece HCPOA: yes    Code Status/Advance Care Planning:  DNR  Comfort Care. No Pacemaker  Please place hospice referral to be obtained in SNF on DC summary    Additional Recommendations (Limitations, Scope, Preferences):  Get hospice consult in SNF Psycho-social/Spiritual:   Support System: niece  Desire for further Chaplaincy support:no  Prognosis: < 6 months  Discharge Planning:  Skilled facility. Please have facility order hospice consuolt. Pt being dc'd today       Chief Complaint/History of Present Illness: Pt is a 81 yo female admitted with worsening fatigue and shortness of breath. She was noted to be bradycardic on admission  Primary Diagnoses  Present on Admission:  . Bradycardia  Palliative Review of Systems: Denies current dyspnea or pain. No n/v.  I have reviewed the medical record, interviewed the patient and family, and examined the patient. The following aspects are pertinent.  Past Medical History  Diagnosis Date  . CHF (congestive  heart failure)   . A-fib   . Hypertension   . Constipation   . Arthritis   . Chronic systolic heart failure   . Dependence on supplemental oxygen   . Other chronic pain   . Contracture of ankle and foot joint   . Muscle weakness (generalized)   . Lack of coordination   . Unspecified essential hypertension   . Edema   . GERD (gastroesophageal reflux disease)   . Hyperlipidemia    History   Social History  . Marital Status: Widowed    Spouse Name: N/A  . Number of Children: N/A  . Years of Education: N/A   Social History Main Topics  . Smoking status: Never Smoker   . Smokeless tobacco: Never Used  . Alcohol Use: No  . Drug Use: No  . Sexual Activity: Not on file   Other Topics Concern  . None   Social History Narrative   Family History  Problem Relation Age of Onset  . Diabetes Mother   . Hypertension Mother   . Cancer Brother    Scheduled Meds: . acetaminophen  1,000 mg Oral BID  . feeding supplement (ENSURE ENLIVE)  237 mL Oral Q24H  . fluticasone  1 spray Each Nare Daily  . lisinopril  2.5 mg Oral Daily  . meclizine  12.5 mg Oral BID  . polyvinyl alcohol  1 drop Both Eyes Daily  . potassium chloride SA  40 mEq Oral Daily  . senna-docusate  2 tablet Oral QHS  . sertraline  50 mg Oral q morning - 10a  . sodium chloride  3 mL Intravenous Q12H  . torsemide  20 mg  Oral BID   Continuous Infusions:  PRN Meds:.sodium chloride, acetaminophen, magnesium hydroxide, oxyCODONE, sodium chloride Medications Prior to Admission:  Prior to Admission medications   Medication Sig Start Date End Date Taking? Authorizing Provider  acetaminophen (TYLENOL) 500 MG tablet Take 1,000 mg by mouth 2 (two) times daily.   Yes Historical Provider, MD  amiodarone (PACERONE) 200 MG tablet Take 100 mg by mouth daily. For A-Fib   Yes Historical Provider, MD  calcium-vitamin D (OSCAL WITH D) 250-125 MG-UNIT per tablet Take 1 tablet by mouth 3 (three) times daily.   Yes Historical Provider,  MD  cholecalciferol (VITAMIN D) 1000 UNITS tablet Take 2,000 Units by mouth daily.   Yes Historical Provider, MD  diclofenac sodium (VOLTAREN) 1 % GEL Apply 4 g topically 2 (two) times daily. To both knees   Yes Historical Provider, MD  digoxin (LANOXIN) 0.125 MG tablet Take 0.125 mg by mouth See admin instructions. Give 1 tablet by mouth daily except  Saturday and Sunday.For afib   Yes Historical Provider, MD  ferrous sulfate 325 (65 FE) MG tablet Take 325 mg by mouth daily with breakfast.   Yes Historical Provider, MD  fluticasone (FLONASE) 50 MCG/ACT nasal spray Place 2 sprays into the nose daily. For allergies   Yes Historical Provider, MD  glucosamine-chondroitin 500-400 MG tablet Take 2 tablets by mouth 2 (two) times daily. For osteoarthritis   Yes Historical Provider, MD  lidocaine (LIDODERM) 5 % Place 1 patch onto the skin daily. Remove & Discard patch within 12 hours to left shoulder   Yes Historical Provider, MD  lisinopril (PRINIVIL,ZESTRIL) 2.5 MG tablet Take 2.5 mg by mouth daily. Hold for SBP less than 100.   Yes Historical Provider, MD  metoprolol succinate (TOPROL-XL) 25 MG 24 hr tablet Take 12.5 mg by mouth daily. Hold for SBP less than 100. For HTN   Yes Historical Provider, MD  oxyCODONE (OXY IR/ROXICODONE) 5 MG immediate release tablet Take one tablet by mouth every four hours as needed for pain 03/09/14  Yes Tiffany L Reed, DO  Polyethyl Glycol-Propyl Glycol (SYSTANE) 0.4-0.3 % SOLN Apply 1 drop to eye daily. Administer one drop in to each eye once daily for eye irritation.   Yes Historical Provider, MD  polyethylene glycol (MIRALAX / GLYCOLAX) packet Take 17 g by mouth daily. For constipation   Yes Historical Provider, MD  potassium chloride SA (K-DUR,KLOR-CON) 20 MEQ tablet Take 40 mEq by mouth 2 (two) times daily.   Yes Historical Provider, MD  Rivaroxaban (XARELTO) 15 MG TABS tablet Take 15 mg by mouth daily with supper. Give 1 tablet by mouth daily after evening meal for  A-Fib.   Yes Historical Provider, MD  sennosides-docusate sodium (SENOKOT-S) 8.6-50 MG tablet Take 2 tablets by mouth at bedtime.    Yes Historical Provider, MD  sertraline (ZOLOFT) 50 MG tablet Take 50 mg by mouth every morning. For depression   Yes Historical Provider, MD  torsemide (DEMADEX) 20 MG tablet Take 20 mg by mouth 2 (two) times daily. For congestive heart failure 12/31/12  Yes Tieshia A Durant, NP  guaiFENesin (ROBITUSSIN) 100 MG/5ML SOLN Take 10 mLs by mouth every 6 (six) hours as needed for cough or to loosen phlegm.    Historical Provider, MD  magnesium hydroxide (MILK OF MAGNESIA) 400 MG/5ML suspension Take 60 mLs by mouth daily as needed for mild constipation or moderate constipation.    Historical Provider, MD  meclizine (ANTIVERT) 12.5 MG tablet Take 12.5 mg by mouth 2 (  two) times daily as needed for dizziness. For dizziness    Historical Provider, MD   Allergies  Allergen Reactions  . Codeine Other (See Comments)    Unk   CBC:    Component Value Date/Time   WBC 10.6* 09/23/2014 2321   WBC 9.8 07/03/2014   HGB 12.6 09/23/2014 2346   HCT 37.0 09/23/2014 2346   PLT 282 09/23/2014 2321   MCV 88.9 09/23/2014 2321   NEUTROABS 8.1* 09/23/2014 2321   LYMPHSABS 1.8 09/23/2014 2321   MONOABS 0.6 09/23/2014 2321   EOSABS 0.0 09/23/2014 2321   BASOSABS 0.0 09/23/2014 2321   Comprehensive Metabolic Panel:    Component Value Date/Time   NA 138 09/25/2014 0323   NA 140 07/03/2014   K 5.1 09/25/2014 0323   CL 102 09/25/2014 0323   CO2 28 09/25/2014 0323   BUN 37* 09/25/2014 0323   BUN 21 07/03/2014   CREATININE 2.37* 09/25/2014 0323   CREATININE 1.6* 07/03/2014   GLUCOSE 87 09/25/2014 0323   CALCIUM 9.4 09/25/2014 0323   AST 26 09/24/2014 0550   ALT 11* 09/24/2014 0550   ALKPHOS 95 09/24/2014 0550   BILITOT 1.2 09/24/2014 0550   PROT 7.1 09/24/2014 0550   ALBUMIN 3.3* 09/24/2014 0550    Physical Exam: Vital Signs: BP 126/51 mmHg  Pulse 46  Temp(Src) 97.6 F  (36.4 C) (Oral)  Resp 17  Ht 5\' 3"  (1.6 m)  Wt 77.747 kg (171 lb 6.4 oz)  BMI 30.37 kg/m2  SpO2 100% SpO2: SpO2: 100 % O2 Device: O2 Device: Nasal Cannula O2 Flow Rate: O2 Flow Rate (L/min): 1 L/min Intake/output summary:  Intake/Output Summary (Last 24 hours) at 09/25/14 1249 Last data filed at 09/25/14 1275  Gross per 24 hour  Intake    480 ml  Output    400 ml  Net     80 ml   LBM: Last BM Date: 09/22/14 Baseline Weight: Weight: 77.656 kg (171 lb 3.2 oz) (inc pads weigh 1.1lbs, bed scale) Most recent weight: Weight: 77.747 kg (171 lb 6.4 oz)  Exam Findings:  General: Well nourished elderly female in no acute distress Resp: No work of breathing noted. Diminished breath sounds. Clear Cardiac: Bradycardic, at 45         Palliative Performance Scale: 40-50%              Additional Data Reviewed: Recent Labs     09/23/14  2321  09/23/14  2346  09/24/14  0550  09/25/14  0323  WBC  10.6*   --    --    --   HGB  12.0  12.6   --    --   PLT  282   --    --    --   NA  139  139  139  138  BUN  26*  29*  26*  37*  CREATININE  1.71*  1.80*  1.87*  2.37*     Time In: 1230 Time Out: 1315 Time Total:45 min  Greater than 50%  of this time was spent counseling and coordinating care related to the above assessment and plan.  Signed by: Irean Hong, NP  Irean Hong, NP  09/25/2014, 12:49 PM  Please contact Palliative Medicine Team phone at [phone] for questions and concerns.

## 2014-09-29 ENCOUNTER — Non-Acute Institutional Stay (SKILLED_NURSING_FACILITY): Payer: Medicare Other | Admitting: Internal Medicine

## 2014-09-29 ENCOUNTER — Encounter: Payer: Self-pay | Admitting: Internal Medicine

## 2014-09-29 DIAGNOSIS — R001 Bradycardia, unspecified: Secondary | ICD-10-CM | POA: Diagnosis not present

## 2014-09-29 DIAGNOSIS — I482 Chronic atrial fibrillation, unspecified: Secondary | ICD-10-CM

## 2014-09-29 DIAGNOSIS — N179 Acute kidney failure, unspecified: Secondary | ICD-10-CM

## 2014-09-29 DIAGNOSIS — I5042 Chronic combined systolic (congestive) and diastolic (congestive) heart failure: Secondary | ICD-10-CM | POA: Diagnosis not present

## 2014-09-29 DIAGNOSIS — N184 Chronic kidney disease, stage 4 (severe): Secondary | ICD-10-CM | POA: Diagnosis not present

## 2014-09-29 DIAGNOSIS — R627 Adult failure to thrive: Secondary | ICD-10-CM | POA: Diagnosis not present

## 2014-09-29 NOTE — Assessment & Plan Note (Signed)
End stage dilated cardiomyopathy; pallitive care has been rec

## 2014-09-29 NOTE — Assessment & Plan Note (Signed)
On xarelto as prophylaxis;no rate contrl needed

## 2014-09-29 NOTE — Assessment & Plan Note (Signed)
Was seen by pallative in hospital;neds to be continued at sNF

## 2014-09-29 NOTE — Assessment & Plan Note (Signed)
stage III - IV creatinine increased from 1.87 to 2.37

## 2014-09-29 NOTE — Progress Notes (Signed)
MRN: [id number] Name: Kara Wilkerson  Sex: female Age: 81 y.o. DOB: December 09, 1922  [facility]: [facility] Facility/Room:311 Level Of Care: SNF Provider: Merrilee Seashore D Emergency Contacts: Extended Emergency Contact Information Primary Emergency Contact: Huntely,Douschkas Address: [address] [place], [place] [address] D[place] of [place] Home Phone: [phone] Relation: Niece Secondary Emergency Contact: [facility] Address: [address] [place], [place] [address] [place] of [place] Phone: [phone] Relation: Other  Code Status: DNR  Allergies: Codeine  Chief Complaint  Patient presents with  . Readmit To SNF    HPI: Patient is 81 y.o. female who is admitted to SNF after being hospitalized for endstage dilated cardiomyopathy.  Past Medical History  Diagnosis Date  . CHF (congestive heart failure)   . A-fib   . Hypertension   . Constipation   . Arthritis   . Chronic systolic heart failure   . Dependence on supplemental oxygen   . Other chronic pain   . Contracture of ankle and foot joint   . Muscle weakness (generalized)   . Lack of coordination   . Unspecified essential hypertension   . Edema   . GERD (gastroesophageal reflux disease)   . Hyperlipidemia     Past Surgical History  Procedure Laterality Date  . Right ankle orif    . Fracture surgery        Medication List       This list is accurate as of: 09/29/14  9:16 PM.  Always use your most recent med list.               acetaminophen 500 MG tablet  Commonly known as:  TYLENOL  Take 1,000 mg by mouth 2 (two) times daily.     calcium-vitamin D 250-125 MG-UNIT per tablet  Commonly known as:  OSCAL WITH D  Take 1 tablet by mouth 3 (three) times daily.     diclofenac sodium 1 % Gel  Commonly known as:  VOLTAREN  Apply 4 g topically 2 (two) times daily. To both knees     feeding supplement (ENSURE ENLIVE) Liqd  Take 237 mLs by mouth  daily.     ferrous sulfate 325 (65 FE) MG tablet  Take 325 mg by mouth daily with breakfast.     fluticasone 50 MCG/ACT nasal spray  Commonly known as:  FLONASE  Place 2 sprays into the nose daily. For allergies     glucosamine-chondroitin 500-400 MG tablet  Take 2 tablets by mouth 2 (two) times daily. For osteoarthritis     guaiFENesin 100 MG/5ML Soln  Commonly known as:  ROBITUSSIN  Take 10 mLs by mouth every 6 (six) hours as needed for cough or to loosen phlegm.     lidocaine 5 %  Commonly known as:  LIDODERM  Place 1 patch onto the skin daily. Remove & Discard patch within 12 hours to left shoulder     lisinopril 2.5 MG tablet  Commonly known as:  PRINIVIL,ZESTRIL  Take 2.5 mg by mouth daily. Hold for SBP less than 100.     magnesium hydroxide 400 MG/5ML suspension  Commonly known as:  MILK OF MAGNESIA  Take 60 mLs by mouth daily as needed for mild constipation or moderate constipation.     meclizine 12.5 MG tablet  Commonly known as:  ANTIVERT  Take 12.5 mg by mouth 2 (two) times daily as needed for dizziness.  For dizziness     oxyCODONE 5 MG immediate release tablet  Commonly known as:  Oxy IR/ROXICODONE  Take one tablet by mouth every four hours as needed for pain     polyethylene glycol packet  Commonly known as:  MIRALAX / GLYCOLAX  Take 17 g by mouth daily. For constipation     potassium chloride SA 20 MEQ tablet  Commonly known as:  K-DUR,KLOR-CON  Take 2 tablets (40 mEq total) by mouth once.     Rivaroxaban 15 MG Tabs tablet  Commonly known as:  XARELTO  Take 15 mg by mouth daily with supper. Give 1 tablet by mouth daily after evening meal for A-Fib.     sennosides-docusate sodium 8.6-50 MG tablet  Commonly known as:  SENOKOT-S  Take 2 tablets by mouth at bedtime.     sertraline 50 MG tablet  Commonly known as:  ZOLOFT  Take 50 mg by mouth every morning. For depression     SYSTANE 0.4-0.3 % Soln  Generic drug:  Polyethyl Glycol-Propyl Glycol   Apply 1 drop to eye daily. Administer one drop in to each eye once daily for eye irritation.     torsemide 20 MG tablet  Commonly known as:  DEMADEX  Take 20 mg by mouth 2 (two) times daily. For congestive heart failure        No orders of the defined types were placed in this encounter.    Immunization History  Administered Date(s) Administered  . Influenza Whole 01/22/2013  . Influenza-Unspecified 01/26/2014  . PPD Test 11/20/2011    History  Substance Use Topics  . Smoking status: Never Smoker   . Smokeless tobacco: Never Used  . Alcohol Use: No    Family history is noncontributory    Review of Systems  DATA OBTAINED: from patient, nurse GENERAL:  no fevers, fatigue, appetite changes SKIN: No itching, rash or wounds EYES: No eye pain, redness, discharge EARS: No earache, tinnitus, change in hearing NOSE: No congestion, drainage or bleeding  MOUTH/THROAT: No mouth or tooth pain, No sore throat RESPIRATORY: No cough, wheezing, SOB CARDIAC: No chest pain, palpitations, lower extremity edema  GI: No abdominal pain, No N/V/D or constipation, No heartburn or reflux  GU: No dysuria, frequency or urgency, or incontinence  MUSCULOSKELETAL: No unrelieved bone/joint pain NEUROLOGIC: No headache, dizziness or focal weakness PSYCHIATRIC: No overt anxiety or sadness, No behavior issue.   Filed Vitals:   09/29/14 1958  BP: 90/55  Pulse: 59  Temp: 97.4 F (36.3 C)  Resp: 18    Physical Exam  GENERAL APPEARANCE: Alert, conversant,  No acute distress.  SKIN: No diaphoresis rash HEAD: Normocephalic, atraumatic  EYES: Conjunctiva/lids clear. Pupils round, reactive. EOMs intact.  EARS: External exam WNL, canals clear. Hearing grossly normal.  NOSE: No deformity or discharge.  MOUTH/THROAT: Lips w/o lesions  RESPIRATORY: Breathing is even, unlabored. Lung sounds are clear   CARDIOVASCULAR: Heart RRR no murmurs, rubs or gallops. No peripheral edema.   GASTROINTESTINAL:  Abdomen is soft, non-tender, not distended w/ normal bowel sounds. GENITOURINARY: Bladder non tender, not distended  MUSCULOSKELETAL: No abnormal joints or musculature NEUROLOGIC:  Cranial nerves 2-12 grossly intact PSYCHIATRIC: Mood and affect appropriate to situation, no behavioral issues  Patient Active Problem List   Diagnosis Date Noted  . FTT (failure to thrive) in adult 09/29/2014  . Palliative care encounter   . Shortness of breath   . Bradycardia 09/24/2014  . Acute renal failure superimposed on stage 4 chronic kidney  disease 01/09/2014  . Edema 09/03/2013  . Anemia 09/03/2013  . Depression 08/13/2013  . Vitamin D deficiency 08/13/2013  . A-fib 10/16/2012  . Essential hypertension, benign 10/16/2012  . Chronic combined systolic and diastolic CHF (congestive heart failure) 10/16/2012  . Unspecified constipation 10/16/2012  . Osteoarthritis 10/16/2012  . Other malaise and fatigue 10/16/2012  . Incomplete tear of rotator cuff 09/30/2012    CBC    Component Value Date/Time   WBC 10.6* 09/23/2014 2321   WBC 9.8 07/03/2014   RBC 4.07 09/23/2014 2321   HGB 12.6 09/23/2014 2346   HCT 37.0 09/23/2014 2346   PLT 282 09/23/2014 2321   MCV 88.9 09/23/2014 2321   LYMPHSABS 1.8 09/23/2014 2321   MONOABS 0.6 09/23/2014 2321   EOSABS 0.0 09/23/2014 2321   BASOSABS 0.0 09/23/2014 2321    CMP     Component Value Date/Time   NA 138 09/25/2014 0323   NA 140 07/03/2014   K 5.1 09/25/2014 0323   CL 102 09/25/2014 0323   CO2 28 09/25/2014 0323   GLUCOSE 87 09/25/2014 0323   BUN 37* 09/25/2014 0323   BUN 21 07/03/2014   CREATININE 2.37* 09/25/2014 0323   CREATININE 1.6* 07/03/2014   CALCIUM 9.4 09/25/2014 0323   PROT 7.1 09/24/2014 0550   ALBUMIN 3.3* 09/24/2014 0550   AST 26 09/24/2014 0550   ALT 11* 09/24/2014 0550   ALKPHOS 95 09/24/2014 0550   BILITOT 1.2 09/24/2014 0550   GFRNONAA 17* 09/25/2014 0323   GFRAA 20* 09/25/2014 0323    Assessment and  Plan  Bradycardia when she complained of fatigue, dizziness, was found to have markedly sinus bradycardia, first-degree AV block, ventricular rate of 30 bpm. She was admitted to the hospital and initial thought of ICD/pacemaker was felt not to be appropriate in a patient with multiple medical comorbidities and advanced age and patient preference.  Her amiodarone and digoxin and beta blockers were held. Heart rate improved to 45-50 bpm, patient throughout the hospitalization remained stable without any clinical evidence of congestive heart failure. Her serum creatinine did increase on the day of discharge, however patient stated that she feels well, also her niece also expressed that she is more comfortable in the nursing home, we discussed regarding hospice care/palliative care, they were all in agreement. Palliative care consultation was performed,  Chronic combined systolic and diastolic CHF (congestive heart failure) End stage dilated cardiomyopathy; pallitive care has been rec  Acute renal failure superimposed on stage 4 chronic kidney disease stage III - IV creatinine increased from 1.87 to 2.37  A-fib On xarelto as prophylaxis;no rate contrl needed  FTT (failure to thrive) in adult Was seen by pallative in hospital;neds to be continued at sNF    Margit Hanks, MD

## 2014-09-29 NOTE — Assessment & Plan Note (Signed)
when she complained of fatigue, dizziness, was found to have markedly sinus bradycardia, first-degree AV block, ventricular rate of 30 bpm. She was admitted to the hospital and initial thought of ICD/pacemaker was felt not to be appropriate in a patient with multiple medical comorbidities and advanced age and patient preference.  Her amiodarone and digoxin and beta blockers were held. Heart rate improved to 45-50 bpm, patient throughout the hospitalization remained stable without any clinical evidence of congestive heart failure. Her serum creatinine did increase on the day of discharge, however patient stated that she feels well, also her niece also expressed that she is more comfortable in the nursing home, we discussed regarding hospice care/palliative care, they were all in agreement. Palliative care consultation was performed,

## 2014-11-17 ENCOUNTER — Other Ambulatory Visit: Payer: Self-pay | Admitting: *Deleted

## 2014-11-17 ENCOUNTER — Non-Acute Institutional Stay (SKILLED_NURSING_FACILITY): Payer: Medicare Other | Admitting: Internal Medicine

## 2014-11-17 ENCOUNTER — Encounter: Payer: Self-pay | Admitting: Internal Medicine

## 2014-11-17 DIAGNOSIS — I482 Chronic atrial fibrillation, unspecified: Secondary | ICD-10-CM

## 2014-11-17 DIAGNOSIS — I5042 Chronic combined systolic (congestive) and diastolic (congestive) heart failure: Secondary | ICD-10-CM | POA: Diagnosis not present

## 2014-11-17 DIAGNOSIS — F329 Major depressive disorder, single episode, unspecified: Secondary | ICD-10-CM

## 2014-11-17 DIAGNOSIS — F32A Depression, unspecified: Secondary | ICD-10-CM

## 2014-11-17 DIAGNOSIS — R001 Bradycardia, unspecified: Secondary | ICD-10-CM

## 2014-11-17 MED ORDER — OXYCODONE HCL 5 MG PO TABS: ORAL_TABLET | ORAL | Status: DC

## 2014-11-17 NOTE — Telephone Encounter (Signed)
[facility] 

## 2014-11-17 NOTE — Progress Notes (Signed)
[name], female   DOB: 1922-05-28, 81 y.o.   MRN: [id number] MRN: [id number] Name: Kara Wilkerson  Sex: female Age: 81 y.o. DOB: [birth year]  [facility] #: [facility] Facility/Room:311 Level Of Care: SNF Provider: Roena Malady Emergency Contacts: Extended Emergency Contact Information Primary Emergency Contact: Huntely,Douschkas Address: [address] [place], [place] [address] Darden Amber of [place] Home Phone: [phone] Relation: Niece Secondary Emergency Contact: [facility] Address: [address] [place], [place] [address] Darden Amber of [place] Phone: [phone] Relation: Other  Code Status: DNR  Allergies: Codeine  Chief Complaint  Patient presents with  . Medical Management of Chronic Issues    HPI: Patient is 81 y.o. female who was admitted to SNF after being hospitalized for endstage dilated cardiomyopathy.--She was found to be bradycardic in the hospital with dizziness first degree AV block with ventricular rate of 30 bpm there was some thought of an ICD placement but thought not be appropriate with multiple medical comorbidities and advanced age and patient preference.  Her amiodarone and digoxin beta blockers were held and heart rate improved into the high 40s to low 50s and remained stable.  In regards to CHF this is been stable her weights have been stable she has been followed by cardiology she is on Demadex 20 mg twice a day.  Today she has no acute complaints she does have some lower extremity edema but this does not appear acutely change from her baseline she does not complain of chest pain or shortness of breath.  Nursing staff feels at times that she is somewhat depressed and feeling blue because she can't go home they would like her to have a psych consult which I believe is a good idea    Past Medical History  Diagnosis Date  . CHF (congestive heart failure)   . A-fib   . Hypertension    . Constipation   . Arthritis   . Chronic systolic heart failure   . Dependence on supplemental oxygen   . Other chronic pain   . Contracture of ankle and foot joint   . Muscle weakness (generalized)   . Lack of coordination   . Unspecified essential hypertension   . Edema   . GERD (gastroesophageal reflux disease)   . Hyperlipidemia     Past Surgical History  Procedure Laterality Date  . Right ankle orif    . Fracture surgery        Medication List       This list is accurate as of: 11/17/14 11:59 PM.  Always use your most recent med list.               acetaminophen 500 MG tablet  Commonly known as:  TYLENOL  Take 1,000 mg by mouth 2 (two) times daily.     calcium-vitamin D 250-125 MG-UNIT per tablet  Commonly known as:  OSCAL WITH D  Take 1 tablet by mouth 3 (three) times daily.     diclofenac sodium 1 % Gel  Commonly known as:  VOLTAREN  Apply 4 g topically 2 (two) times daily. To both knees     feeding supplement (ENSURE ENLIVE) Liqd  Take 237 mLs by mouth daily.     ferrous sulfate 325 (65 FE) MG tablet  Take 325 mg by mouth daily with breakfast.     fluticasone 50 MCG/ACT nasal spray  Commonly known  as:  FLONASE  Place 2 sprays into the nose daily. For allergies     glucosamine-chondroitin 500-400 MG tablet  Take 2 tablets by mouth 2 (two) times daily. For osteoarthritis     guaiFENesin 100 MG/5ML Soln  Commonly known as:  ROBITUSSIN  Take 10 mLs by mouth every 6 (six) hours as needed for cough or to loosen phlegm.     lidocaine 5 %  Commonly known as:  LIDODERM  Place 1 patch onto the skin daily. Remove & Discard patch within 12 hours to left shoulder     lisinopril 2.5 MG tablet  Commonly known as:  PRINIVIL,ZESTRIL  Take 2.5 mg by mouth daily. Hold for SBP less than 100.     magnesium hydroxide 400 MG/5ML suspension  Commonly known as:  MILK OF MAGNESIA  Take 60 mLs by mouth daily as needed for mild constipation or moderate constipation.      meclizine 12.5 MG tablet  Commonly known as:  ANTIVERT  Take 12.5 mg by mouth 2 (two) times daily as needed for dizziness. For dizziness     oxyCODONE 5 MG immediate release tablet  Commonly known as:  Oxy IR/ROXICODONE  Take one tablet by mouth every four hours as needed for pain     polyethylene glycol packet  Commonly known as:  MIRALAX / GLYCOLAX  Take 17 g by mouth daily. For constipation     potassium chloride SA 20 MEQ tablet  Commonly known as:  K-DUR,KLOR-CON  Take 2 tablets (40 mEq total) by mouth once.     Rivaroxaban 15 MG Tabs tablet  Commonly known as:  XARELTO  Take 15 mg by mouth daily with supper. Give 1 tablet by mouth daily after evening meal for A-Fib.     sennosides-docusate sodium 8.6-50 MG tablet  Commonly known as:  SENOKOT-S  Take 2 tablets by mouth at bedtime.     sertraline 50 MG tablet  Commonly known as:  ZOLOFT  Take 50 mg by mouth every morning. For depression     SYSTANE 0.4-0.3 % Soln  Generic drug:  Polyethyl Glycol-Propyl Glycol  Apply 1 drop to eye daily. Administer one drop in to each eye once daily for eye irritation.     torsemide 20 MG tablet  Commonly known as:  DEMADEX  Take 20 mg by mouth 2 (two) times daily. For congestive heart failure        No orders of the defined types were placed in this encounter.    Immunization History  Administered Date(s) Administered  . Influenza Whole 01/22/2013  . Influenza-Unspecified 01/26/2014  . PPD Test 11/20/2011    Social History  Substance Use Topics  . Smoking status: Never Smoker   . Smokeless tobacco: Never Used  . Alcohol Use: No    Family history is noncontributory    Review of Systems  DATA OBTAINED: from patient, nurse GENERAL:  no fevers, fatigue, appetite changes SKIN: No itching, rash or wounds EYES: No eye pain, redness, discharge EARS: No earache, tinnitus, change in hearing NOSE: No congestion, drainage or bleeding  MOUTH/THROAT: No mouth or tooth  pain, No sore throat RESPIRATORY: No cough, wheezing has, SOB with exertion that is not new and has not worsened recently CARDIAC: No chest pain, palpitations,has chronic lower extremity edema  GI: No abdominal pain, No N/V/D or constipation, No heartburn or reflux  GU: No dysuria, frequency or urgency, or incontinence  MUSCULOSKELETAL: No unrelieved bone/joint pain NEUROLOGIC: No headache, dizziness or focal  weakness PSYCHIATRIC: No overt anxiety or sadness, No behavior issue. Nursing staff does feel however she appears to be somewhat more "blue" realizing she can't go home   Filed Vitals:   11/17/14 1757  BP: 121/77  Pulse: 62  Temp: 97.5 F (36.4 C)  Resp: 18    Physical Exam  GENERAL APPEARANCE: Alert, conversant,  No acute distress.  SKIN: No diaphoresis rash HEAD: Normocephalic, atraumatic  EYES: Conjunctiva/lids clear. Pupils round, reactive. EOMs intact.  EARS: External exam WNL, canals clear. Hearing grossly normal.  NOSE: No deformity or discharge.  MOUTH/THROATOro: Pharynx clear mucous membranes moist RESPIRATORY: Breathing is even, unlabored. Lung sounds are clear   CARDIOVASCULAR: Heart RRR no murmurs, rubs or gallops. Baseline trace 1 plus peripheral edema.   GASTROINTESTINAL: Abdomen is soft, non-tender, not distended w/ normal bowel sounds. MUSCULOSKELETAL: No abnormal joints or musculature--does have lower extremity weakness but is able to walk with assistance with restorative NEUROLOGIC:  Cranial nerves 2-12 grossly intact is baseline lower extremity weakness PSYCHIATRIC: Mood and affect appropriate to situation, no behavioral issues-does state it bothers her a bit that she is not going home but does not complain of being acutely depressed  Patient Active Problem List   Diagnosis Date Noted  . Syncope 11/30/2014  . FTT (failure to thrive) in adult 09/29/2014  . Palliative care encounter   . Shortness of breath   . Bradycardia 09/24/2014  . Acute renal  failure superimposed on stage 4 chronic kidney disease 01/09/2014  . Edema 09/03/2013  . Anemia 09/03/2013  . Depression 08/13/2013  . Vitamin D deficiency 08/13/2013  . A-fib 10/16/2012  . Essential hypertension, benign 10/16/2012  . Chronic combined systolic and diastolic CHF (congestive heart failure) 10/16/2012  . Unspecified constipation 10/16/2012  . Osteoarthritis 10/16/2012  . Other malaise and fatigue 10/16/2012  . Incomplete tear of rotator cuff 09/30/2012    CBC    Component Value Date/Time   WBC 10.6* 09/23/2014 2321   WBC 9.8 07/03/2014   RBC 4.07 09/23/2014 2321   HGB 12.6 09/23/2014 2346   HCT 37.0 09/23/2014 2346   PLT 282 09/23/2014 2321   MCV 88.9 09/23/2014 2321   LYMPHSABS 1.8 09/23/2014 2321   MONOABS 0.6 09/23/2014 2321   EOSABS 0.0 09/23/2014 2321   BASOSABS 0.0 09/23/2014 2321    CMP     Component Value Date/Time   NA 138 09/25/2014 0323   NA 140 07/03/2014   K 5.1 09/25/2014 0323   CL 102 09/25/2014 0323   CO2 28 09/25/2014 0323   GLUCOSE 87 09/25/2014 0323   BUN 37* 09/25/2014 0323   BUN 21 07/03/2014   CREATININE 2.37* 09/25/2014 0323   CREATININE 1.6* 07/03/2014   CALCIUM 9.4 09/25/2014 0323   PROT 7.1 09/24/2014 0550   ALBUMIN 3.3* 09/24/2014 0550   AST 26 09/24/2014 0550   ALT 11* 09/24/2014 0550   ALKPHOS 95 09/24/2014 0550   BILITOT 1.2 09/24/2014 0550   GFRNONAA 17* 09/25/2014 0323   GFRAA 20* 09/25/2014 0323    Assessment and Plan  Bradycardia-again this was worked up in the hospital as noted above amiodarone and digoxin and beta blockers were discontinued-again ICD a speaker placement deferred secondary to her comorbidities advanced age and patient's wishes this has been stable peers pulses recently run in the high 50s lower 60s generally.  Chronic combined systolic and diastolic CHF-this appears stable on Demadex-- her weight has been stable at this point continue to monitor--update metabolic panel.  #3 acute renal  failure superimposed on stage IV chronic kidney disease-most recent creatinine in the hospital 2.37 which appears to be high end of her baseline  will need to be rechecked  #4-history of atrial fibrillation she is on Xarelto prophylaxis no rate control needed as noted above this has been stable.  #5 depression she is on Zoloft will order a psychiatric consult as noted above as well.  Anemia she is on iron Will update CBC.    #6 chronic pain she is on oxycodone 5 mg every 4 hours when necessary pain she is not complaining of discomfort today suspect she does have some discomfort when ambulating but she does not complaining of acute changes here.  #7 hypertension-his appears stable recent blood pressures 101/72-135/69-112/65 she is on low dose lisinopril again her beta blocker has been discontinued nonetheless blood pressures appear to be stable.  Number a history of dizziness she has been evaluated by cardiology she is on Antivert as needed  .  [id number]  Aleshka Corney C,

## 2014-11-30 ENCOUNTER — Non-Acute Institutional Stay (SKILLED_NURSING_FACILITY): Payer: Medicare Other | Admitting: Internal Medicine

## 2014-11-30 ENCOUNTER — Encounter: Payer: Self-pay | Admitting: Internal Medicine

## 2014-11-30 DIAGNOSIS — I482 Chronic atrial fibrillation, unspecified: Secondary | ICD-10-CM

## 2014-11-30 DIAGNOSIS — R001 Bradycardia, unspecified: Secondary | ICD-10-CM

## 2014-11-30 DIAGNOSIS — R55 Syncope and collapse: Secondary | ICD-10-CM

## 2014-11-30 NOTE — Progress Notes (Signed)
[name], female   DOB: 1922/11/21, 81 y.o.   MRN: [id number]     his is a routine visit.  Level care skilled.  Facility [facility].  Chief complaint- Acute visit secondary to question syncopal presyncopal episode last week in shower  History of present illness Patient is a pleasant 81 year old female was been quite stable although she is complex medically-.     -she does have a history of chronic systolic CHF with an ejection fraction of 25% she is on Demedexwith potassium supplementation.   She also has a history of atrial fibrillation this appears rate controlled she was on amiodarone as well as digoxin as well as metoprolol for rate control.--She is anticoagulated with Xarelto She does have episodes of bradycardia and actually was hospitalized at one point but was thought to be not a good candidate for any aggressive intervention and this is been quite stable since then--medication adjustments apparently were made including discontinuing her beta blocker digoxin and amiodarone  Apparently last week during a shower when she was stood up she became somewhat weak and presyncopal although according to nursing staff she never did lose consciousness.  Nursing  says this happens occasionaly l  and has resolved fairly unremarkably and that was the case this time her vital signs were stable and she has been at her baseline since.  I do note she has complained of dizziness before cardiology has evaluated this and she is on Antivert  She did not complain of any shortness of breath or chest pain during the episode and when I asked her today about it she also denied this-.  Her vital signs continued to be stable she has no complaints today she does have some chronic lower extremity edema but this does not appear to be increased from her baseline.       Previous medical history.  CHF.  A. fib.  Hypertension.  Constipation.  Arthritis.  Chronic  pain.  Muscle weakness.  Edema.  GERD.  Hyperlipidemia.  Family medical social history reviewed per recent progress notes including 01/09/2014.  Medications.  Guaifenesin 10 mL every 6 hours when necessary.  Calcium with vitamin D 1 tab 3 times a day with meals.  Antivert 12.5 mg every 12 hours when necessary dizziness.  Milk of magnesia when necessary constipation.    MiraLAX daily.  Flonase 0.05% nasal spray 2 sprays each nostril daily.  Systane eyedrops 1 drop each eye daily.  Vitamin D 2000 units 1 capsule daily.  Lidoderm 5% patch to left shoulder every 12 hours on in a.m. off in p.m..  Iron 325 mg daily.  Zoloft 50 mg daily.     Xarelto 15 mg daily.  Lisinopril 2.5 mg daily at bedtime.  Senna 2 tabs by mouth bedtime.  Oxycodone 5 mg 1 tablet every 3 hours when necessary pain glucosamine 2 capsules twice a day.  Tylenol 1000 mg twice a day.  Voltaren gel  1% gel 4 g to knees twice a day.  Potassium 20 mEq give 2 tablets equals 40 mEq twice a day.  Torsemide 20 mg twice a day  Review of systems.  In general no complaints of fever or chills.  Skin does not complain of itching or rashes.  Head ears eyes nose mouth and throat-is not have any complaints today h    Respiratory no complaints of increased shortness of breath or cough.  Cardiac does not complain of chest pain has chronic lower extremity edema with history of  CHF as noted above--at times, she thinks her edema is increasing but exam continues to be  quite baseline.  GI does not complain of abdominal discomfort nausea or vomiting or constipation she is on numerous agents for constipation.  GU does not complain of dysuria.  Musculoskeletal does not complain his time of acute joint pain does have history of shoulder pain especially left shoulder pain which is chronic--  .  Neurologic does not complain of dizziness or headache.today  Psych does not complain of overt anxiety or  sadness continues to be in good spirits it appears.  Physical exam.  She is afebrile pulse 65 respirations 18 blood pressure 130/68 this was taken manually  In general this is a pleasant elderly female in no distress resting comfortably in bed.  Her skin is warm and dry.  Head ears eyes nose mouth and throat-oropharynx is clear mucous membranes moist.  Continues with a disconjugate right eye with lateral deviation-she has prescription lenses.  Oropharynx clear mucous membranes moist.  Chest is clear to auscultation there is no labored breathing.  Heart is regular rate and rhythm--with an occasional irregular beat- without murmur gallop or rub she has I would say trace--1 plus lower extremity edema today--  Abdomen is soft nontender positive bowel sounds.  Muscle skeletal-continues with limited range of motion of her left arm secondary to her shoulder issues I did not note any deformities 4.    Neurologic appears grossly intact without lateralizing findings her speech is clear cranial nerves appear grossly intact she does have right eye lateral deviation.   Labs  11/18/2014.  Sodium 138 potassium 3.6 BUN 27 creatinine 1.4.  WBC 8.9 hemoglobin 11.0 platelets 274.  Liver function tests within normal limits except albumin of 3.4   F12 2016.  Sodium 140 potassium 4.1 BUN 22 creatinine 1.5-liver function tests within normal limits.  Digoxin 1.1.  WBC 8.8 hemoglobin 10.8 platelets 260.  TSH-2.07.    03/24/2015.  Sodium 139 potassium 4.2 BUN 33 creatinine 1.7.  03/14/2015. Digoxin level 0.7.  WBC 9.0 hemoglobin 10.9 platelets 271  02/20/2014--TSH-2.28.  BNP 56.  Liver function tests within normal limits albumin of 3.5.  Marland Kitchen  12/31/2013.  WBC 11.8 hemoglobin 11.9 platelets 317.  Sodium 136 potassium 4.8 BUN 25 creatinine 1.44.  Liver function tests within normal limits.  TSH-1.696.  Digoxin-0.2.  Assessment plan.   #1-syncopal-presyncopal  episode?-As noted above this happens occasionally but not very frequently-she has been stable since then-will update her labs including a CBC CMP and TSH.  Of note she does have a history of atrial fibrillation and bradycardia and her rate limiting agents including digoxin metoprolol and amiodarone have been discontinued-recent pulses appear to be more in the 60 to 80s range with occasionally a bradycardic reading but this is not a common.  At this point continue to monitor.--I do note vertigo may be contributing to this and she is on Antivert when necessary  I did discuss plan of care with Dr. Lyn Hollingshead via phone.  [id number]       [id number].

## 2015-02-03 ENCOUNTER — Encounter: Payer: Self-pay | Admitting: Internal Medicine

## 2015-02-03 ENCOUNTER — Non-Acute Institutional Stay (SKILLED_NURSING_FACILITY): Payer: Medicare Other | Admitting: Internal Medicine

## 2015-02-03 DIAGNOSIS — I482 Chronic atrial fibrillation, unspecified: Secondary | ICD-10-CM

## 2015-02-03 DIAGNOSIS — N184 Chronic kidney disease, stage 4 (severe): Secondary | ICD-10-CM | POA: Diagnosis not present

## 2015-02-03 DIAGNOSIS — R001 Bradycardia, unspecified: Secondary | ICD-10-CM

## 2015-02-03 DIAGNOSIS — N179 Acute kidney failure, unspecified: Secondary | ICD-10-CM

## 2015-02-03 DIAGNOSIS — D649 Anemia, unspecified: Secondary | ICD-10-CM

## 2015-02-03 DIAGNOSIS — I5042 Chronic combined systolic (congestive) and diastolic (congestive) heart failure: Secondary | ICD-10-CM

## 2015-02-03 NOTE — Progress Notes (Signed)
[name], female   DOB: 11-Jul-1922, 81 y.o.   MRN: [id number]  MRN: [id number] Name: Kara Wilkerson  Sex: female Age: 81 y.o. DOB: 1922-05-09  [facility] Facility/Room:311 Level Of Care: SNF Provider: Roena Malady Emergency Contacts: Extended Emergency Contact Information Primary Emergency Contact: Huntely,Douschkas Address: [address] [place], [place] [address] Darden Amber of [place] Home Phone: [phone] Relation: Niece Secondary Emergency Contact: [facility] Address: [address] [place], [place] [address] Darden [place] of [place] Phone: [phone] Relation: Other  Code Status: DNR  Allergies: Codeine  Chief Complaint  Patient presents with  . Medical Management of Chronic Issues    HPI: Patient is 81 y.o. female who was admitted to SNF after being hospitalized for endstage dilated cardiomyopathy.--She was found to be bradycardic in the hospital with dizziness first degree AV block with ventricular rate of 30 bpm there was some thought of an ICD placement but thought not be appropriate with multiple medical comorbidities and advanced age and patient preference.  Her amiodarone and digoxin beta blockers were held and heart rate improved into the high 40s to low 50s and remained stable.--- Recent pulses range from the high 50s to the low 70s  In regards to CHF this is been  fairly stable  Although appears she may have had some mild weight gain here appears her baseline weights are in the mid 170s weight most recently 180.4 --she appears to possibly have some mildly increased edema She is currently on Demadex 20 mg twice a day   I do see she saw cardiology on October 11 was thought to be stable  Today she has no acute complaints -she continues to be concerned about her edema but this is not a new complaint She does complain of shortness of breath with exertion but this is not new  either  She does have occasional episodes that appear to be presyncope--this is been going on apparently for some time-thought  possibly there may be an element of vertigo here and she does have Antivert as needed.  She does ambulate with restorative there according to restorative she appears to have somewhat increased weakness that has been gradual possiblydecreased exercise tolerance over a period of time .   When I recently saw her she appeared to be somewhat more depressed-appearing she has been seen by psychiatric services and appears her Zoloft has been increased she appears to be in better spirits today    Past Medical History  Diagnosis Date  . CHF (congestive heart failure) (HCC)   . A-fib (HCC)   . Hypertension   . Constipation   . Arthritis   . Chronic systolic heart failure (HCC)   . Dependence on supplemental oxygen   . Other chronic pain   . Contracture of ankle and foot joint   . Muscle weakness (generalized)   . Lack of coordination   . Unspecified essential hypertension   . Edema   . GERD (gastroesophageal reflux disease)   . Hyperlipidemia     Past Surgical History  Procedure Laterality Date  . Right ankle orif    . Fracture surgery        Medication List       This list is accurate as of: 02/03/15  1:51 PM.  Always use your most recent med list.  acetaminophen 500 MG tablet  Commonly known as:  TYLENOL  Take 1,000 mg by mouth 2 (two) times daily.     calcium-vitamin D 250-125 MG-UNIT tablet  Commonly known as:  OSCAL WITH D  Take 1 tablet by mouth 3 (three) times daily.     diclofenac sodium 1 % Gel  Commonly known as:  VOLTAREN  Apply 4 g topically 2 (two) times daily. To both knees     feeding supplement (ENSURE ENLIVE) Liqd  Take 237 mLs by mouth daily.     ferrous sulfate 325 (65 FE) MG tablet  Take 325 mg by mouth daily with breakfast.     fluticasone 50 MCG/ACT nasal spray  Commonly known as:  FLONASE  Place 2  sprays into the nose daily. For allergies     glucosamine-chondroitin 500-400 MG tablet  Take 2 tablets by mouth 2 (two) times daily. For osteoarthritis     guaiFENesin 100 MG/5ML Soln  Commonly known as:  ROBITUSSIN  Take 10 mLs by mouth every 6 (six) hours as needed for cough or to loosen phlegm.     lidocaine 5 %  Commonly known as:  LIDODERM  Place 1 patch onto the skin daily. Remove & Discard patch within 12 hours to left shoulder     lisinopril 2.5 MG tablet  Commonly known as:  PRINIVIL,ZESTRIL  Take 2.5 mg by mouth daily. Hold for SBP less than 100.     magnesium hydroxide 400 MG/5ML suspension  Commonly known as:  MILK OF MAGNESIA  Take 60 mLs by mouth daily as needed for mild constipation or moderate constipation.     meclizine 12.5 MG tablet  Commonly known as:  ANTIVERT  Take 12.5 mg by mouth 2 (two) times daily as needed for dizziness. For dizziness     oxyCODONE 5 MG immediate release tablet  Commonly known as:  Oxy IR/ROXICODONE  Take one tablet by mouth every four hours as needed for pain     polyethylene glycol packet  Commonly known as:  MIRALAX / GLYCOLAX  Take 17 g by mouth daily. For constipation     potassium chloride SA 20 MEQ tablet  Commonly known as:  K-DUR,KLOR-CON  Take 2 tablets (40 mEq total) by mouth once.     Rivaroxaban 15 MG Tabs tablet  Commonly known as:  XARELTO  Take 15 mg by mouth daily with supper. Give 1 tablet by mouth daily after evening meal for A-Fib.     sennosides-docusate sodium 8.6-50 MG tablet  Commonly known as:  SENOKOT-S  Take 2 tablets by mouth at bedtime.     sertraline 50 MG tablet  Commonly known as:  ZOLOFT  Take 100 mg by mouth every morning. For depression     SYSTANE 0.4-0.3 % Soln  Generic drug:  Polyethyl Glycol-Propyl Glycol  Apply 1 drop to eye daily. Administer one drop in to each eye once daily for eye irritation.     torsemide 20 MG tablet  Commonly known as:  DEMADEX  Take 20 mg by mouth 2  (two) times daily. For congestive heart failure       of note her Zoloft has been increased to 125 mg daily by psychiatric services    Immunization History  Administered Date(s) Administered  . Influenza Whole 01/22/2013  . Influenza-Unspecified 01/26/2014  . PPD Test 11/20/2011    Social History  Substance Use Topics  . Smoking status: Never Smoker   . Smokeless tobacco: Never Used  .  Alcohol Use: No    Family history is noncontributory    Review of Systems  DATA OBTAINED: from patient, nurse GENERAL:  no fevers, fatigue, appetite changes SKIN: No itching, rash or wounds EYES: No eye pain, redness, discharge EARS: No earache, tinnitus, change in hearing NOSE: No congestion, drainage or bleeding  MOUTH/THROAT: No mouth or tooth pain, No sore throat RESPIRATORY: No cough, wheezing has, SOB with exertion that is not new--but says this has possibly progressed over a period of months CARDIAC: No chest pain, palpitations,has chronic lower extremity edema  GI: No abdominal pain, No N/V/D or constipation, No heartburn or reflux  GU: No dysuria, frequency or urgency, or incontinence  MUSCULOSKELETAL: No unrelieved bone/joint pain does complain of chronic shoulder discomfort more so of her left she does have an Lidoderm patch applied NEUROLOGIC: No headache, dizziness or focal weakness PSYCHIATRIC: No overt anxiety or sadness, No behavior issue. Nursing staff does feel however she appears to be somewhat more "blue" realizing she can't go home   Filed Vitals:   02/03/15 1342  BP: 104/64  Pulse: 62  Temp: 97.4 F (36.3 C)  Resp: 16    Physical Exam  GENERAL APPEARANCE: Alert, conversant,  No acute distress.  SKIN: No diaphoresis rash HEAD: Normocephalic, atraumatic  EYES: Conjunctiva/lids clear. Pupils round, reactive. EOMs intact.  EARS: External exam WNL, canals clear. Hearing grossly normal.  NOSE: No deformity or discharge.  MOUTH/THROATOro: Pharynx clear mucous  membranes moist RESPIRATORY: Breathing is even, unlabored. Lung sounds are clear   CARDIOVASCULAR: Heart RRR no murmurs, rubs or gallops.   1 plus peripheral edema.--This appears possibly slightly increased from previous exams   GASTROINTESTINAL: Abdomen is soft, non-tender, not distended w/ normal bowel sounds. MUSCULOSKELETAL: No abnormal joints or musculature--does have lower extremity weakness but is able to walk with assistance with restorative NEUROLOGIC:  Cranial nerves 2-12 grossly intact is baseline lower extremity weakness PSYCHIATRIC: Mood and affect appropriate to situation, no behavioral issues-pleasant and appropriate  Patient Active Problem List   Diagnosis Date Noted  . Syncope 11/30/2014  . FTT (failure to thrive) in adult 09/29/2014  . Palliative care encounter   . Shortness of breath   . Bradycardia 09/24/2014  . Acute renal failure superimposed on stage 4 chronic kidney disease (HCC) 01/09/2014  . Edema 09/03/2013  . Anemia 09/03/2013  . Depression 08/13/2013  . Vitamin D deficiency 08/13/2013  . A-fib (HCC) 10/16/2012  . Essential hypertension, benign 10/16/2012  . Chronic combined systolic and diastolic CHF (congestive heart failure) (HCC) 10/16/2012  . Unspecified constipation 10/16/2012  . Osteoarthritis 10/16/2012  . Other malaise and fatigue 10/16/2012  . Incomplete tear of rotator cuff 09/30/2012       Labs.  12/01/2014.  Sodium 139 potassium 3.7 BUN 21 creatinine 1.4-.  Liver function tests within normal limits except albumin of 3.4.  WBC 8.8 hemoglobin 11.3 platelets 297.  TSH-1.81   CBC    Component Value Date/Time   WBC 10.6* 09/23/2014 2321   WBC 9.8 07/03/2014   RBC 4.07 09/23/2014 2321   HGB 12.6 09/23/2014 2346   HCT 37.0 09/23/2014 2346   PLT 282 09/23/2014 2321   MCV 88.9 09/23/2014 2321   LYMPHSABS 1.8 09/23/2014 2321   MONOABS 0.6 09/23/2014 2321   EOSABS 0.0 09/23/2014 2321   BASOSABS 0.0 09/23/2014 2321    CMP      Component Value Date/Time   NA 138 09/25/2014 0323   NA 140 07/03/2014   K 5.1 09/25/2014 0323  CL 102 09/25/2014 0323   CO2 28 09/25/2014 0323   GLUCOSE 87 09/25/2014 0323   BUN 37* 09/25/2014 0323   BUN 21 07/03/2014   CREATININE 2.37* 09/25/2014 0323   CREATININE 1.6* 07/03/2014   CALCIUM 9.4 09/25/2014 0323   PROT 7.1 09/24/2014 0550   ALBUMIN 3.3* 09/24/2014 0550   AST 26 09/24/2014 0550   ALT 11* 09/24/2014 0550   ALKPHOS 95 09/24/2014 0550   BILITOT 1.2 09/24/2014 0550   GFRNONAA 17* 09/25/2014 0323   GFRAA 20* 09/25/2014 0323    Assessment and Plan  Bradycardia-again this was worked up in the hospital as noted above amiodarone and digoxin and beta blockers were discontinued-again ICD /pacemaker placement deferred secondary to her comorbidities advanced age and patient's wishes this has been stable peers pulses recently run in the high 50s lower 70s generally--today patient states she would like to consider pacemaker-suspect she would be a poor candidate as noted above for this but will write an order to contact cardiology to get their opinion.  Chronic combined systolic and diastolic CHF-t She continues on Demadex 20 mg BID-it appears she may have some mild weight gain--she is on low dose ACE inhibitor as well not a good candidate for beta blocker again secondary to her bradycardia issues.--We will increase her Demadex to 40 mg every morning for 2 days--continue the 20 mg every afternoon and then go back to 20 mg BID will update a metabolic panel tomorrow as well as first laboratory day next week to keep an eye-- on her renal function and electrolytes this was discussed with Dr. Earney Hamburg continue to monitor weights closely  #3 acute renal failure superimposed on stage IV chronic kidney disease-most recent creatinine 1.4 with a BUN of 21 we will update this this appears to be stable for her  #4-history of atrial fibrillation she is on Xarelto prophylaxis no rate  control needed as noted above this has been stable.  #5 depression she is on Zoloft-this has been increased by psychiatric services.  Anemia she is on iron Will update CBC--most recent hemoglobin 11.3.    #7 chronic pain she is on oxycodone 5 mg every 4 hours when necessary pain she is not complaining of discomfort today suspect she does have some discomfort when ambulating but she does not complaining of acute changes here She also has a lidocaine patch for left shoulder pain which is chronic.  #8hypertension-his appears stable recent blood pressures 101/72-135/69-112/65 she is on low dose lisinopril again her beta blocker has been discontinued nonetheless blood pressures appear to be stable.  Number  9 a history of dizziness she has been evaluated by cardiology she is on Antivert as needed  .  [id number]--of note greater than 40 minutes spent assessing patient-reviewing her chart-discussing her concerns at bedside-and coordinating and formulating a plan of care for numerous diagnoses-of note greater than 50% of time spent coordinating plan of care  Latitia Housewright C,

## 2015-02-10 ENCOUNTER — Encounter: Payer: Self-pay | Admitting: Internal Medicine

## 2015-02-10 ENCOUNTER — Non-Acute Institutional Stay (SKILLED_NURSING_FACILITY): Payer: Medicare Other | Admitting: Internal Medicine

## 2015-02-10 DIAGNOSIS — I480 Paroxysmal atrial fibrillation: Secondary | ICD-10-CM

## 2015-02-10 DIAGNOSIS — R001 Bradycardia, unspecified: Secondary | ICD-10-CM | POA: Diagnosis not present

## 2015-02-10 DIAGNOSIS — I5042 Chronic combined systolic (congestive) and diastolic (congestive) heart failure: Secondary | ICD-10-CM

## 2015-02-10 NOTE — Assessment & Plan Note (Addendum)
Pt has endstage dilated cardiomyopathy which will not change. Pt kows she gets SOB with exertion but still wants to be active. She is not fully aware of the end stage condition of heart although I'm sure she has been told before. Plan - cont demadex and ACE; not on BBlocker 2/2 bradycardia

## 2015-02-10 NOTE — Assessment & Plan Note (Signed)
With endstage cardiomyopathy going ito a fib could easily compromise blood flow to the brain. Pt is on xarelto as prophylaxis but rate metoprolol for rate was d/c 2/2 bradycardia

## 2015-02-10 NOTE — Progress Notes (Signed)
MRN: [id number] Name: Kara Wilkerson  Sex: female Age: 81 y.o. DOB: 1922-12-13  [facility] Facility/Room:311 Level Of Care: SNF Provider: Merrilee Seashore D Emergency Contacts: Extended Emergency Contact Information Primary Emergency Contact: Huntely,Douschkas Address: [address] [place], [place] [address] Darden Amber of [place] Home Phone: [phone] Relation: Niece Secondary Emergency Contact: [facility] Address: [address] [place], [place] [address] D[place] of [place] Phone: [phone] Relation: Other  Code Status:   Allergies: Codeine  Chief Complaint  Patient presents with  . Acute Visit    HPI: Patient is 81 y.o. female who CHF, AF, HTN OA, GERD and HLD who is being seen to day for concern by PT and RT for "episodes" in which pt stares, stiffens or falls and has apparent LOC for some seconds. Most recent epidode a few days ago with RT in the parking lot while walking. Speaking with nursing and DON and these episodes are well known and have been going on for at least several years and occur at rest as well as with exertion.  Past Medical History  Diagnosis Date  . CHF (congestive heart failure) (HCC)   . A-fib (HCC)   . Hypertension   . Constipation   . Arthritis   . Chronic systolic heart failure (HCC)   . Dependence on supplemental oxygen   . Other chronic pain   . Contracture of ankle and foot joint   . Muscle weakness (generalized)   . Lack of coordination   . Unspecified essential hypertension   . Edema   . GERD (gastroesophageal reflux disease)   . Hyperlipidemia     Past Surgical History  Procedure Laterality Date  . Right ankle orif    . Fracture surgery        Medication List       This list is accurate as of: 02/10/15  8:25 PM.  Always use your most recent med list.               acetaminophen 500 MG tablet  Commonly known as:  TYLENOL  Take 1,000 mg by mouth 2  (two) times daily.     calcium-vitamin D 250-125 MG-UNIT tablet  Commonly known as:  OSCAL WITH D  Take 1 tablet by mouth 3 (three) times daily.     diclofenac sodium 1 % Gel  Commonly known as:  VOLTAREN  Apply 4 g topically 2 (two) times daily. To both knees     feeding supplement (ENSURE ENLIVE) Liqd  Take 237 mLs by mouth daily.     ferrous sulfate 325 (65 FE) MG tablet  Take 325 mg by mouth daily with breakfast.     fluticasone 50 MCG/ACT nasal spray  Commonly known as:  FLONASE  Place 2 sprays into the nose daily. For allergies     glucosamine-chondroitin 500-400 MG tablet  Take 2 tablets by mouth 2 (two) times daily. For osteoarthritis     guaiFENesin 100 MG/5ML Soln  Commonly known as:  ROBITUSSIN  Take 10 mLs by mouth every 6 (six) hours as needed for cough or to loosen phlegm.     lidocaine 5 %  Commonly known as:  LIDODERM  Place 1 patch onto the skin daily. Remove & Discard patch within 12 hours to left shoulder     lisinopril 2.5 MG tablet  Commonly known as:  PRINIVIL,ZESTRIL  Take 2.5  mg by mouth daily. Hold for SBP less than 100.     magnesium hydroxide 400 MG/5ML suspension  Commonly known as:  MILK OF MAGNESIA  Take 60 mLs by mouth daily as needed for mild constipation or moderate constipation.     meclizine 12.5 MG tablet  Commonly known as:  ANTIVERT  Take 12.5 mg by mouth 2 (two) times daily as needed for dizziness. For dizziness     oxyCODONE 5 MG immediate release tablet  Commonly known as:  Oxy IR/ROXICODONE  Take one tablet by mouth every four hours as needed for pain     polyethylene glycol packet  Commonly known as:  MIRALAX / GLYCOLAX  Take 17 g by mouth daily. For constipation     potassium chloride SA 20 MEQ tablet  Commonly known as:  K-DUR,KLOR-CON  Take 2 tablets (40 mEq total) by mouth once.     Rivaroxaban 15 MG Tabs tablet  Commonly known as:  XARELTO  Take 15 mg by mouth daily with supper. Give 1 tablet by mouth daily after  evening meal for A-Fib.     sennosides-docusate sodium 8.6-50 MG tablet  Commonly known as:  SENOKOT-S  Take 2 tablets by mouth at bedtime.     sertraline 50 MG tablet  Commonly known as:  ZOLOFT  Take 100 mg by mouth every morning. For depression     SYSTANE 0.4-0.3 % Soln  Generic drug:  Polyethyl Glycol-Propyl Glycol  Apply 1 drop to eye daily. Administer one drop in to each eye once daily for eye irritation.     torsemide 20 MG tablet  Commonly known as:  DEMADEX  Take 20 mg by mouth 2 (two) times daily. For congestive heart failure        No orders of the defined types were placed in this encounter.    Immunization History  Administered Date(s) Administered  . Influenza Whole 01/22/2013  . Influenza-Unspecified 01/26/2014  . PPD Test 11/20/2011    Social History  Substance Use Topics  . Smoking status: Never Smoker   . Smokeless tobacco: Never Used  . Alcohol Use: No    Review of Systems  DATA OBTAINED: from patient, nurse, medical record GENERAL:  no fevers, fatigue, appetite changes SKIN: No itching, rash HEENT: No complaint RESPIRATORY: No cough, wheezing, SOB CARDIAC: No chest pain, palpitations, lower extremity edema  GI: No abdominal pain, No N/V/D or constipation, No heartburn or reflux  GU: No dysuria, frequency or urgency, or incontinence  MUSCULOSKELETAL: No unrelieved bone/joint pain NEUROLOGIC: No headache, dizziness  PSYCHIATRIC: No overt anxiety or sadness  Filed Vitals:   02/10/15 1352  BP: 132/62  Pulse: 65  Temp: 97 F (36.1 C)  Resp: 18    Physical Exam  GENERAL APPEARANCE: Alert, conversant, BF sitting No acute distress  SKIN: No diaphoresis rash HEENT: Unremarkable RESPIRATORY: Breathing is even, unlabored. Lung sounds are clear   CARDIOVASCULAR: Heart RRR no murmurs, rubs or gallops. No peripheral edema  GASTROINTESTINAL: Abdomen is soft, non-tender, not distended w/ normal bowel sounds.  GENITOURINARY: Bladder non tender,  not distended  MUSCULOSKELETAL: No abnormal joints or musculature NEUROLOGIC: Cranial nerves 2-12 grossly intact. Moves all extremities PSYCHIATRIC: Mood and affect appropriate to situation, mild dementia no behavioral issues  Patient Active Problem List   Diagnosis Date Noted  . Syncope 11/30/2014  . FTT (failure to thrive) in adult 09/29/2014  . Palliative care encounter   . Shortness of breath   . Symptomatic bradycardia 09/24/2014  .  Acute renal failure superimposed on stage 4 chronic kidney disease (HCC) 01/09/2014  . Edema 09/03/2013  . Anemia 09/03/2013  . Depression 08/13/2013  . Vitamin D deficiency 08/13/2013  . Paroxysmal atrial fibrillation (HCC) 10/16/2012  . Essential hypertension, benign 10/16/2012  . Chronic combined systolic and diastolic CHF (congestive heart failure) (HCC) 10/16/2012  . Unspecified constipation 10/16/2012  . Osteoarthritis 10/16/2012  . Other malaise and fatigue 10/16/2012  . Incomplete tear of rotator cuff 09/30/2012    CBC    Component Value Date/Time   WBC 10.6* 09/23/2014 2321   WBC 9.8 07/03/2014   RBC 4.07 09/23/2014 2321   HGB 12.6 09/23/2014 2346   HCT 37.0 09/23/2014 2346   PLT 282 09/23/2014 2321   MCV 88.9 09/23/2014 2321   LYMPHSABS 1.8 09/23/2014 2321   MONOABS 0.6 09/23/2014 2321   EOSABS 0.0 09/23/2014 2321   BASOSABS 0.0 09/23/2014 2321    CMP     Component Value Date/Time   NA 138 09/25/2014 0323   NA 140 07/03/2014   K 5.1 09/25/2014 0323   CL 102 09/25/2014 0323   CO2 28 09/25/2014 0323   GLUCOSE 87 09/25/2014 0323   BUN 37* 09/25/2014 0323   BUN 21 07/03/2014   CREATININE 2.37* 09/25/2014 0323   CREATININE 1.6* 07/03/2014   CALCIUM 9.4 09/25/2014 0323   PROT 7.1 09/24/2014 0550   ALBUMIN 3.3* 09/24/2014 0550   AST 26 09/24/2014 0550   ALT 11* 09/24/2014 0550   ALKPHOS 95 09/24/2014 0550   BILITOT 1.2 09/24/2014 0550   GFRNONAA 17* 09/25/2014 0323   GFRAA 20* 09/25/2014 0323    Assessment and  Plan  I spoke with pt at length with Chuck there for some of the conference. I was very frank with her about the condition of her heart and she open with me, she wants to maintain activity. As long as pt , family and staff are aware of risks of fall with fx or dying with activity I  would like to keep pt active. DON will also be speaking to the pt and to family who are already aware of syncopal episodes.  Chronic combined systolic and diastolic CHF (congestive heart failure) Pt has endstage dilated cardiomyopathy which will not change. Pt kows she gets SOB with exertion but still wants to be active. She is not fully aware of the end stage condition of heart although I'm sure she has been told before. Plan - cont demadex and ACE; not on BBlocker 2/2 bradycardia  Symptomatic bradycardia And the symptom is temporary dizzy or frank LOC for some seconds. Pt has some pre-syncopal sx  Much of the timeand  they occur at rest and with exertion. The most recent episode  [name] thought Ms Rennie was actually dead but she came around and was fine.  Paroxysmal atrial fibrillation (HCC) With endstage cardiomyopathy going ito a fib could easily compromise blood flow to the brain. Pt is on xarelto as prophylaxis but rate metoprolol for rate was d/c 2/2 bradycardia   Time spent > 45 min with pt, multiple staff and record Margit Hanks, MD

## 2015-02-10 NOTE — Assessment & Plan Note (Signed)
And the symptom is temporary dizzy or frank LOC for some seconds. Pt has some pre-syncopal sx  Much of the timeand  they occur at rest and with exertion. The most recent episode  Kara Wilkerson thought [name] was actually dead but she came around and was fine.

## 2015-03-29 ENCOUNTER — Encounter: Payer: Self-pay | Admitting: Internal Medicine

## 2015-03-29 ENCOUNTER — Non-Acute Institutional Stay (SKILLED_NURSING_FACILITY): Payer: Medicare Other | Admitting: Internal Medicine

## 2015-03-29 DIAGNOSIS — N179 Acute kidney failure, unspecified: Secondary | ICD-10-CM

## 2015-03-29 DIAGNOSIS — I480 Paroxysmal atrial fibrillation: Secondary | ICD-10-CM | POA: Diagnosis not present

## 2015-03-29 DIAGNOSIS — R55 Syncope and collapse: Secondary | ICD-10-CM

## 2015-03-29 DIAGNOSIS — I5042 Chronic combined systolic (congestive) and diastolic (congestive) heart failure: Secondary | ICD-10-CM

## 2015-03-29 DIAGNOSIS — I10 Essential (primary) hypertension: Secondary | ICD-10-CM

## 2015-03-29 DIAGNOSIS — N184 Chronic kidney disease, stage 4 (severe): Secondary | ICD-10-CM

## 2015-03-29 NOTE — Progress Notes (Signed)
[name], female   DOB: 1922-11-04, 81 y.o.   MRN: [id number]   MRN: [id number] Name: Kara Wilkerson  Sex: female Age: 81 y.o. DOB: 1922/06/21  [facility] #: [facility] Facility/Room:311 Level Of Care: SNF Provider: Roena Malady Emergency Contacts: Extended Emergency Contact Information Primary Emergency Contact: Huntely,Douschkas Address: [address] [place], [place] [address] Darden [place] of [place] Home Phone: [phone] Relation: Niece Secondary Emergency Contact: [facility] Address: [address] [place], [place] [address] D[place] of [place] Phone: [phone] Relation: Other  Code Status: DNR  Allergies: Codeine  Chief Complaint  Patient presents with  . Medical Management of Chronic Issues   including end-stage CHF atrial fibrillation-hypertension chronic pain- depression-anemia  HPI: Patient is 81 y.o. female who was admitted to SNF after being hospitalized for endstage dilated cardiomyopathy.--She was found to be bradycardic in the hospital with dizziness first degree AV block with ventricular rate of 30 bpm there was some thought of an ICD placement but thought not be appropriate with multiple medical comorbidities and advanced age and patient preference.  Her amiodarone and digoxin beta blockers were held and heart rate improved into the high 40s to low 50s and remained stable.--- Recent pulses range from the high 50s to the 80s  In regards to CHF  She continues on Demadex her weights have been fairly stable.  Most recent acute issue has been some repeated syncopal presyncopal episode she has-this is not new and has been occurring for a number of years.  Dr. Lyn Hollingshead did recently see her for follow-up of this-apparently during one  episode where the restorative tech actually thought that possibly she had died-but she quickly returned back to her baseline.  However patient did express to Dr.  Lyn Hollingshead she continues to want to have therapy and ambulate-it is a quality of life issue-  Today she has no acute complaints -she continues to be concerned about her edema but this is not a new complaint She does complain of shortness of breath with exertion but this is not new either  Patient gradually appears to becoming weaker and declining but I would not say this is a precipitous change .   When I recently saw her she appeared to be somewhat more depressed-appearing she has been seen by psychiatric services and appears her Zoloft has been increased   Apparently she's recently complained of dysuria and a urine culture is pending she has been afebrile     Past Medical History  Diagnosis Date  . CHF (congestive heart failure) (HCC)   . A-fib (HCC)   . Hypertension   . Constipation   . Arthritis   . Chronic systolic heart failure (HCC)   . Dependence on supplemental oxygen   . Other chronic pain   . Contracture of ankle and foot joint   . Muscle weakness (generalized)   . Lack of coordination   . Unspecified essential hypertension   . Edema   . GERD (gastroesophageal reflux disease)   . Hyperlipidemia     Past Surgical History  Procedure Laterality Date  . Right ankle orif    . Fracture surgery        Medication List       This list is accurate as of: 03/29/15 11:59 PM.  Always use your most recent med list.  acetaminophen 500 MG tablet  Commonly known as:  TYLENOL  Take 1,000 mg by mouth 2 (two) times daily.     calcium-vitamin D 250-125 MG-UNIT tablet  Commonly known as:  OSCAL WITH D  Take 1 tablet by mouth 3 (three) times daily.     diclofenac sodium 1 % Gel  Commonly known as:  VOLTAREN  Apply 4 g topically 2 (two) times daily. To both knees     feeding supplement (ENSURE ENLIVE) Liqd  Take 237 mLs by mouth daily.     ferrous sulfate 325 (65 FE) MG tablet  Take 325 mg by mouth daily with breakfast.     fluticasone 50 MCG/ACT  nasal spray  Commonly known as:  FLONASE  Place 2 sprays into the nose daily. For allergies     glucosamine-chondroitin 500-400 MG tablet  Take 2 tablets by mouth 2 (two) times daily. For osteoarthritis     guaiFENesin 100 MG/5ML Soln  Commonly known as:  ROBITUSSIN  Take 10 mLs by mouth every 6 (six) hours as needed for cough or to loosen phlegm.     lidocaine 5 %  Commonly known as:  LIDODERM  Place 1 patch onto the skin daily. Remove & Discard patch within 12 hours to left shoulder     lisinopril 2.5 MG tablet  Commonly known as:  PRINIVIL,ZESTRIL  Take 2.5 mg by mouth daily. Hold for SBP less than 100.     magnesium hydroxide 400 MG/5ML suspension  Commonly known as:  MILK OF MAGNESIA  Take 60 mLs by mouth daily as needed for mild constipation or moderate constipation.     meclizine 12.5 MG tablet  Commonly known as:  ANTIVERT  Take 12.5 mg by mouth 2 (two) times daily as needed for dizziness. For dizziness     oxyCODONE 5 MG immediate release tablet  Commonly known as:  Oxy IR/ROXICODONE  Take one tablet by mouth every four hours as needed for pain     polyethylene glycol packet  Commonly known as:  MIRALAX / GLYCOLAX  Take 17 g by mouth daily. For constipation     potassium chloride SA 20 MEQ tablet  Commonly known as:  K-DUR,KLOR-CON  Take 2 tablets (40 mEq total) by mouth once.     Rivaroxaban 15 MG Tabs tablet  Commonly known as:  XARELTO  Take 15 mg by mouth daily with supper. Give 1 tablet by mouth daily after evening meal for A-Fib.     sennosides-docusate sodium 8.6-50 MG tablet  Commonly known as:  SENOKOT-S  Take 2 tablets by mouth at bedtime.     sertraline 50 MG tablet  Commonly known as:  ZOLOFT  Take 100 mg by mouth every morning. For depression     SYSTANE 0.4-0.3 % Soln  Generic drug:  Polyethyl Glycol-Propyl Glycol  Apply 1 drop to eye daily. Administer one drop in to each eye once daily for eye irritation.     torsemide 20 MG tablet   Commonly known as:  DEMADEX  Take 20 mg by mouth 2 (two) times daily. For congestive heart failure       of note her Zoloft has been increased to 125 mg daily by psychiatric services    Immunization History  Administered Date(s) Administered  . Influenza Whole 01/22/2013  . Influenza-Unspecified 01/26/2014  . PPD Test 11/20/2011    Social History  Substance Use Topics  . Smoking status: Never Smoker   . Smokeless tobacco: Never Used  .  Alcohol Use: No    Family history is noncontributory    Review of Systems  DATA OBTAINED: from patient, nurse GENERAL:  no fevers, fatigue, appetite changes SKIN: No itching, rash or wounds EYES: No eye pain, redness, discharge EARS: No earache, tinnitus, change in hearing NOSE: No congestion, drainage or bleeding  MOUTH/THROAT: No mouth or tooth pain, No sore throat RESPIRATORY: No cough, wheezing has, SOB with exertion that is not new--but says this has possibly progressed over  extended period of time  CARDIAC: No chest pain, palpitations,has chronic lower extremity edema --frequent syncopal presyncopal episodes GI: No abdominal pain, No N/V/D or constipation, No heartburn or reflux  GU: Intermittent complaints of dysuria,  MUSCULOSKELETAL: No unrelieved bone/joint pain does complain of chronic shoulder discomfort more so of her left she does have an Lidoderm patch applied NEUROLOGIC: No headache, r focal weakness PSYCHIATRIC: No overt anxiety or sadness, No behavior issue. Nursing staff does feel however she appears to be somewhat more "blue" realizing she can't go home   Filed Vitals:   03/29/15 2116  BP: 128/69  Pulse: 87  Temp: 97.3 F (36.3 C)  Resp: 18    Physical Exam  GENERAL APPEARANCE: Alert, conversant,  No acute distress.--Appears to be gradually getting weaker compared to previous exams  SKIN: No diaphoresis rash HEAD: Normocephalic, atraumatic  EYES: Conjunctiva/lids clear. Pupils round, reactive. EOMs intact.   EARS: External exam WNL, canals clear. Hearing grossly normal.  NOSE: No deformity or discharge.  MOUTH/THROAT--oropharynx clear mucous membranes moist RESPIRATORY: Breathing is even, unlabored. Lung sounds are clear   CARDIOVASCULAR: Heart RRR with occasional irregular beats no murmurs, rubs or gallops.   1 plus peripheral edema.-- This appears stable she has compression hose on  GASTROINTESTINAL: Abdomen is soft, non-tender, not distended w/ normal bowel sounds. GU-could not really appreciate CV tenderness--borderline suprapubic tenderness MUSCULOSKELETAL: No abnormal joints or musculature--does have lower extremity weakness but is able to walk with assistance with restorative NEUROLOGIC:  Cranial nerves 2-12 grossly intact is baseline lower extremity weakness PSYCHIATRIC: Mood and affect appropriate to situation, no behavioral issues-pleasant and appropriate  Patient Active Problem List   Diagnosis Date Noted  . Syncope 11/30/2014  . FTT (failure to thrive) in adult 09/29/2014  . Palliative care encounter   . Shortness of breath   . Symptomatic bradycardia 09/24/2014  . Acute renal failure superimposed on stage 4 chronic kidney disease (HCC) 01/09/2014  . Edema 09/03/2013  . Anemia 09/03/2013  . Depression 08/13/2013  . Vitamin D deficiency 08/13/2013  . Paroxysmal atrial fibrillation (HCC) 10/16/2012  . Essential hypertension, benign 10/16/2012  . Chronic combined systolic and diastolic CHF (congestive heart failure) (HCC) 10/16/2012  . Unspecified constipation 10/16/2012  . Osteoarthritis 10/16/2012  . Other malaise and fatigue 10/16/2012  . Incomplete tear of rotator cuff 09/30/2012       Labs  02/10/2015.  Sodium 138 potassium 3.5 BUN 28 creatinine 1.4.  WBC 9.2 hemoglobin 11.7 platelets 295.  Marland Kitchen  12/01/2014.  Sodium 139 potassium 3.7 BUN 21 creatinine 1.4-.  Liver function tests within normal limits except albumin of 3.4.  WBC 8.8 hemoglobin 11.3 platelets  297.  TSH-1.81   CBC    Component Value Date/Time   WBC 10.6* 09/23/2014 2321   WBC 9.8 07/03/2014   RBC 4.07 09/23/2014 2321   HGB 12.6 09/23/2014 2346   HCT 37.0 09/23/2014 2346   PLT 282 09/23/2014 2321   MCV 88.9 09/23/2014 2321   LYMPHSABS 1.8 09/23/2014 2321  MONOABS 0.6 09/23/2014 2321   EOSABS 0.0 09/23/2014 2321   BASOSABS 0.0 09/23/2014 2321    CMP     Component Value Date/Time   NA 138 09/25/2014 0323   NA 140 07/03/2014   K 5.1 09/25/2014 0323   CL 102 09/25/2014 0323   CO2 28 09/25/2014 0323   GLUCOSE 87 09/25/2014 0323   BUN 37* 09/25/2014 0323   BUN 21 07/03/2014   CREATININE 2.37* 09/25/2014 0323   CREATININE 1.6* 07/03/2014   CALCIUM 9.4 09/25/2014 0323   PROT 7.1 09/24/2014 0550   ALBUMIN 3.3* 09/24/2014 0550   AST 26 09/24/2014 0550   ALT 11* 09/24/2014 0550   ALKPHOS 95 09/24/2014 0550   BILITOT 1.2 09/24/2014 0550   GFRNONAA 17* 09/25/2014 0323   GFRAA 20* 09/25/2014 0323    Assessment and Plan  Bradycardia-again this was worked up in the hospital as noted above amiodarone and digoxin and beta blockers were discontinued-again ICD /pacemaker placement deferred secondary to her comorbidities advanced age and patient's wishes this has been stable pulses recently run in the high 50s 80s generally-- Continues to have presyncopal and syncopal episodes as noted above the patient desires to continue working with restorative ambulation- .  Chronic combined systolic and diastolic CHF-t She continues on Demadex 20 mg BID-i--she is on low dose ACE inhibitor as well-- not a good candidate for beta blocker again secondary to her bradycardia issues.- At times she will increase her Demadex slightly but at this pointr edema appears to be stable  #3 acute renal failure superimposed on stage IV chronic kidney disease-most recent creatinine 1.4 with a BUN of 28 we will update this this appears to be stable for her  #4-history of atrial fibrillation she is on  Xarelto prophylaxis no rate control needed as noted above this has been stable.  #5 depression she is on Zoloft-this has been increased by psychiatric services.  Anemia she is on iron Will update CBC--most recent hemoglobin 11.7--on lab done proximally 2 months ago.    #7 chronic pain she is on oxycodone 5 mg every 4 hours when necessary pain she is not complaining of discomfort today suspect she does have some discomfort when ambulating but she does not complaining of acute changes here She also has a lidocaine patch for left shoulder pain which is chronic.  #8hypertension-his appears stable recent blood pressures 128/69-109/66-139/71 she is on low dose lisinopril again her beta blocker has been discontinued nonetheless blood pressures appear to be stable.  Number  9 a history of dizziness she has been evaluated by cardiology she is on Antivert as needed   #10-dysuria-urine culture is pending physical exam was equivocal  .  [id number]--of note greater than 35 minutes spent assessing patient-reviewing her chart-discussing her concerns at bedside-and coordinating and formulating a plan of care for numerous diagnoses-of note greater than 50% of time spent coordinating plan of care  Alejandra Barna C,

## 2015-05-24 ENCOUNTER — Encounter: Payer: Self-pay | Admitting: Internal Medicine

## 2015-05-24 ENCOUNTER — Non-Acute Institutional Stay (SKILLED_NURSING_FACILITY): Payer: Medicare Other | Admitting: Internal Medicine

## 2015-05-24 DIAGNOSIS — I5042 Chronic combined systolic (congestive) and diastolic (congestive) heart failure: Secondary | ICD-10-CM

## 2015-05-24 DIAGNOSIS — R05 Cough: Secondary | ICD-10-CM | POA: Diagnosis not present

## 2015-05-24 DIAGNOSIS — R059 Cough, unspecified: Secondary | ICD-10-CM

## 2015-05-24 DIAGNOSIS — M159 Polyosteoarthritis, unspecified: Secondary | ICD-10-CM

## 2015-05-24 DIAGNOSIS — I480 Paroxysmal atrial fibrillation: Secondary | ICD-10-CM | POA: Diagnosis not present

## 2015-05-24 DIAGNOSIS — R55 Syncope and collapse: Secondary | ICD-10-CM | POA: Diagnosis not present

## 2015-05-24 DIAGNOSIS — M150 Primary generalized (osteo)arthritis: Secondary | ICD-10-CM | POA: Diagnosis not present

## 2015-05-24 DIAGNOSIS — I10 Essential (primary) hypertension: Secondary | ICD-10-CM | POA: Diagnosis not present

## 2015-05-24 LAB — HM DIABETES FOOT EXAM

## 2015-05-24 NOTE — Progress Notes (Signed)
[name], female   DOB: 1922/06/29, 82 y.o.   MRN: [id number]    MRN: [id number] Name: Kara Wilkerson  Sex: female Age: 82 y.o. DOB: Feb 13, 1923  [facility] Facility/Room:311 Level Of Care: SNF Provider: Roena Malady Emergency Contacts: Extended Emergency Contact Information Primary Emergency Contact: Huntely,Douschkas Address: [address] [place], [place] [address] Darden Amber of [place] Home Phone: [phone] Relation: Niece Secondary Emergency Contact: [facility] Address: [address] [place], [place] [address] Darden Amber of [place] Phone: [phone] Relation: Other  Code Status: DNR  Allergies: Codeine  Chief Complaint  Patient presents with  . Medical Management of Chronic Issues  . Acute Visit   including end-stage CHF atrial fibrillation-hypertension chronic pain- depression-anemia  Acute visit secondary to complaints of cold symptoms congestion  HPI: Patient is 82 y.o. female who was admitted to SNF after being hospitalized for endstage dilated cardiomyopathy.--She was found to be bradycardic in the hospital with dizziness first degree AV block with ventricular rate of 30 bpm there was some thought of an ICD placement but thought not be appropriate with multiple medical comorbidities and advanced age and patient preference.  Her amiodarone and digoxin beta blockers were held and heart rate improved into the high 40s to low 50s and remained stable.--- Recent pulses  Appear to be more in the 70s range In regards to CHF  She continues on Demadex her weights have been fairly stable.  Most recent acute issue has been some repeated syncopal presyncopal episode she has-this is not new and has been occurring for a number of years.  Dr. Lyn Hollingshead did see her for follow-up of this-apparently during one  episode where the restorative tech actually thought that possibly she had died-but she  quickly returned back to her baseline.  However patient did express to Dr. Lyn Hollingshead she continues to want to have therapy and ambulate-it is a quality of life issue- Her main complaint is cold like symptoms feel she has some upper respiratory congestion this is confined more so to her head area- does not complain of chest congestion says her breathing is at baseline.  She does not complain of any fever or chills feels basically that she is getting a cold  -she continues to be concerned about her edema but this is not a new complaint She does complain of shortness of breath with exertion but this is not new either  Patient gradually appears to becoming weaker and declining but I would not say this is a precipitous change .  She is followed by psychiatry in several months ago her Zoloft was increased this appears to be stable currently she appears to be in good spirits today    Past Medical History  Diagnosis Date  . CHF (congestive heart failure) (HCC)   . A-fib (HCC)   . Hypertension   . Constipation   . Arthritis   . Chronic systolic heart failure (HCC)   . Dependence on supplemental oxygen   . Other chronic pain   . Contracture of ankle and foot joint   . Muscle weakness (generalized)   . Lack of coordination   . Unspecified essential hypertension   . Edema   . GERD (gastroesophageal reflux disease)   . Hyperlipidemia     Past Surgical History  Procedure Laterality Date  . Right ankle orif    . Fracture surgery  Medication List       This list is accurate as of: 05/24/15 11:59 PM.  Always use your most recent med list.               acetaminophen 500 MG tablet  Commonly known as:  TYLENOL  Take 1,000 mg by mouth 2 (two) times daily.     calcium-vitamin D 250-125 MG-UNIT tablet  Commonly known as:  OSCAL WITH D  Take 1 tablet by mouth 3 (three) times daily.     diclofenac sodium 1 % Gel  Commonly known as:  VOLTAREN  Apply 4 g topically 2 (two)  times daily. To both knees     feeding supplement (ENSURE ENLIVE) Liqd  Take 237 mLs by mouth daily.     ferrous sulfate 325 (65 FE) MG tablet  Take 325 mg by mouth daily with breakfast.     fluticasone 50 MCG/ACT nasal spray  Commonly known as:  FLONASE  Place 2 sprays into the nose daily. For allergies     glucosamine-chondroitin 500-400 MG tablet  Take 2 tablets by mouth 2 (two) times daily. For osteoarthritis     guaiFENesin 100 MG/5ML Soln  Commonly known as:  ROBITUSSIN  Take 10 mLs by mouth every 6 (six) hours as needed for cough or to loosen phlegm.     lidocaine 5 %  Commonly known as:  LIDODERM  Place 1 patch onto the skin daily. Remove & Discard patch within 12 hours to left shoulder     lisinopril 2.5 MG tablet  Commonly known as:  PRINIVIL,ZESTRIL  Take 2.5 mg by mouth daily. Hold for SBP less than 100.     magnesium hydroxide 400 MG/5ML suspension  Commonly known as:  MILK OF MAGNESIA  Take 60 mLs by mouth daily as needed for mild constipation or moderate constipation.     meclizine 12.5 MG tablet  Commonly known as:  ANTIVERT  Take 12.5 mg by mouth 2 (two) times daily as needed for dizziness. For dizziness     oxyCODONE 5 MG immediate release tablet  Commonly known as:  Oxy IR/ROXICODONE  Take one tablet by mouth every four hours as needed for pain     polyethylene glycol packet  Commonly known as:  MIRALAX / GLYCOLAX  Take 17 g by mouth daily. For constipation     potassium chloride SA 20 MEQ tablet  Commonly known as:  K-DUR,KLOR-CON  Take 2 tablets (40 mEq total) by mouth once.     Rivaroxaban 15 MG Tabs tablet  Commonly known as:  XARELTO  Take 15 mg by mouth daily with supper. Give 1 tablet by mouth daily after evening meal for A-Fib.     sennosides-docusate sodium 8.6-50 MG tablet  Commonly known as:  SENOKOT-S  Take 2 tablets by mouth at bedtime.     sertraline 50 MG tablet  Commonly known as:  ZOLOFT  Take 125 mg by mouth every morning.  For depression     SYSTANE 0.4-0.3 % Soln  Generic drug:  Polyethyl Glycol-Propyl Glycol  Apply 1 drop to eye daily. Administer one drop in to each eye once daily for eye irritation.     torsemide 20 MG tablet  Commonly known as:  DEMADEX  Take 20 mg by mouth 2 (two) times daily. For congestive heart failure       of note her Zoloft has been increased to 125 mg daily by psychiatric services    Immunization History  Administered Date(s)  Administered  . Influenza Whole 01/22/2013  . Influenza-Unspecified 01/26/2014  . PPD Test 11/20/2011    Social History  Substance Use Topics  . Smoking status: Never Smoker   . Smokeless tobacco: Never Used  . Alcohol Use: No    Family history is noncontributory    Review of Systems  DATA OBTAINED: from patient, nurse GENERAL:  no fevers, fatigue, appetite changes SKIN: No itching, rash or wounds EYES: No eye pain, redness, discharge EARS: No earache, tinnitus, change in hearing NOSE: Does not complain of any increased congestion possibly some clear drainage she attributes to a cold MOUTH/THROAT: No mouth or tooth pain, No sore throat RESPIRATORY: Has a cough somewhat productive of a brownish phlegm-although nursing staff has not really noted much production--no wheezing has, SOB with exertion that is not new--but says this has possibly progressed over  extended period of time  CARDIAC: No chest pain, palpitations,has chronic lower extremity edema --frequent syncopal presyncopal episodes GI: No abdominal pain, No N/V/D or constipation, No heartburn or reflux  GU: Intermittent complaints of dysuria,  MUSCULOSKELETAL: No unrelieved bone/joint pain does complain of chronic shoulder discomfort more so of her left she does have an Lidoderm patch applied NEUROLOGIC: No headache, r focal weakness PSYCHIATRIC: No overt anxiety or sadness, No behavior issue. Apparently had a period of increased depressive symptoms after realizing she is not going  home-but she appears actually to be in pretty good spirits today  Filed Vitals:   05/24/15 2202  BP: 104/61  Pulse: 70  Temp: 97 F (36.1 C)  Resp: 20    Physical Exam  GENERAL APPEARANCE: Alert, conversant,  No acute distress.-Lying in bed comfortably  SKIN: No diaphoresis rash HEAD: Normocephalic, atraumatic  EYES: Conjunctiva/lids clear. Pupils round, reactive. EOMs intact.  EARS: External exam WNL, canals clear. Hearing grossly normal.  NOSE: No deformity or discharge.  MOUTH/THROAT--oropharynx clear mucous membranes moist RESPIRATORY: Breathing is even, unlabored.Possibly a small amount of congestion noted on expiration at the bases bilaterally  CARDIOVASCULAR: Heart RRR with occasional irregular beats no murmurs, rubs or gallops.   1 plus peripheral edema.--This appears stable to slightly decreased from previous exam-she does have her legs elevated in bed which I suspect help-  GASTROINTESTINAL: Abdomen is soft, non-tender, not distended w/ normal bowel sounds. GU-could not really appreciate CV tenderness--borderline suprapubic tenderness MUSCULOSKELETAL: No abnormal joints or musculature--does have lower extremity weakness but is able to walk with assistance with restorative NEUROLOGIC:  Cranial nerves 2-12 grossly intact is baseline lower extremity weakness PSYCHIATRIC: Mood and affect appropriate to situation, no behavioral issues-pleasant and appropriate  Patient Active Problem List   Diagnosis Date Noted  . Cough 05/24/2015  . Syncope 11/30/2014  . FTT (failure to thrive) in adult 09/29/2014  . Palliative care encounter   . Shortness of breath   . Symptomatic bradycardia 09/24/2014  . Acute renal failure superimposed on stage 4 chronic kidney disease (HCC) 01/09/2014  . Edema 09/03/2013  . Anemia 09/03/2013  . Depression 08/13/2013  . Vitamin D deficiency 08/13/2013  . Paroxysmal atrial fibrillation (HCC) 10/16/2012  . Essential hypertension, benign 10/16/2012  .  Chronic combined systolic and diastolic CHF (congestive heart failure) (HCC) 10/16/2012  . Unspecified constipation 10/16/2012  . Osteoarthritis 10/16/2012  . Other malaise and fatigue 10/16/2012  . Incomplete tear of rotator cuff 09/30/2012       Labs  03/29/2016.  Sodium 137 potassium 3.9 BUN 25 creatinine 1.5.  WBC 10.5 hemoglobin 10.3 platelets 353  02/10/2015.  Sodium  138 potassium 3.5 BUN 28 creatinine 1.4.  WBC 9.2 hemoglobin 11.7 platelets 295.  Marland Kitchen  12/01/2014.  Sodium 139 potassium 3.7 BUN 21 creatinine 1.4-.  Liver function tests within normal limits except albumin of 3.4.  WBC 8.8 hemoglobin 11.3 platelets 297.  TSH-1.81   CBC    Component Value Date/Time   WBC 10.6* 09/23/2014 2321   WBC 9.8 07/03/2014   RBC 4.07 09/23/2014 2321   HGB 12.6 09/23/2014 2346   HCT 37.0 09/23/2014 2346   PLT 282 09/23/2014 2321   MCV 88.9 09/23/2014 2321   LYMPHSABS 1.8 09/23/2014 2321   MONOABS 0.6 09/23/2014 2321   EOSABS 0.0 09/23/2014 2321   BASOSABS 0.0 09/23/2014 2321    CMP     Component Value Date/Time   NA 138 09/25/2014 0323   NA 140 07/03/2014   K 5.1 09/25/2014 0323   CL 102 09/25/2014 0323   CO2 28 09/25/2014 0323   GLUCOSE 87 09/25/2014 0323   BUN 37* 09/25/2014 0323   BUN 21 07/03/2014   CREATININE 2.37* 09/25/2014 0323   CREATININE 1.6* 07/03/2014   CALCIUM 9.4 09/25/2014 0323   PROT 7.1 09/24/2014 0550   ALBUMIN 3.3* 09/24/2014 0550   AST 26 09/24/2014 0550   ALT 11* 09/24/2014 0550   ALKPHOS 95 09/24/2014 0550   BILITOT 1.2 09/24/2014 0550   GFRNONAA 17* 09/25/2014 0323   GFRAA 20* 09/25/2014 0323    Assessment and Plan  Bradycardia-again this was worked up in the hospital as noted above amiodarone and digoxin and beta blockers were discontinued-again ICD /pacemaker placement deferred secondary to her comorbidities advanced age and patient's wishes this has been stable pulses recently did be in the 70s this is what I got on exam  today  Continues to have presyncopal and syncopal episodes as noted above the patient desires to continue working with restorative ambulation  to-My knowledge in the last month or 2 there is been no further episode.  Chronic combined systolic and diastolic CHF-t She continues on Demadex 20 mg BID-i--she is on low dose ACE inhibitor as well-- not a good candidate for beta blocker again secondary to her bradycardia issues.- At times she will increase her Demadex slightly but at this pointr edema appears to be stable  #3 acute renal failure superimposed on stage IV chronic kidney disease-retina 1.5 on 03/30/2015 appears to be baseline Will update this  #4-history of atrial fibrillation she is on Xarelto prophylaxis no rate control needed as noted above this has been stable.  #5 depression she is on Zoloft-this has been increased by psychiatric services.  Anemia she is on iron Will update CBC--most recent hemoglobin 10.3-on lab done in late December will update this    #7 chronic pain she is on oxycodone 5 mg every 4 hours when necessary pain she is not complaining of discomfort today suspect she does have some discomfort when ambulating but she does not complaining of acute changes here She also has a lidocaine patch for left shoulder pain which is chronic.  #8hypertension-his appears stable recent blood pressures 104/61-112/60-116/67  she is on low dose lisinopril again her beta blocker has been discontinued nonetheless blood pressures appear to be stable.  Number  9 a history of dizziness she has been evaluated by cardiology she is on Antivert as needed--she is not complaining of dizziness today   #10-cold symptoms?-Appears to have some slight chest congestion-will order a chest x-ray-also Mucinex 600 mg twice a day for 7 days for her  cough which appears to be semi-productive-also duo nebs every 6 hours when necessary for shortness of breath 5 days-monitor closely with vital signs her pulse  ox every shift for 72 hours-she appears to be stable but is a fragile individual  .  [id number]--of note greater than 35 minutes spent assessing patient-reviewing her chart-discussing her concerns at bedside-and coordinating and formulating a plan of care for numerous diagnoses-of note greater than 50% of time spent coordinating plan of care  Mychael Smock C,

## 2015-05-25 LAB — BASIC METABOLIC PANEL
BUN: 29 mg/dL — AB (ref 4–21)
Creatinine: 1.6 mg/dL — AB (ref 0.5–1.1)
GLUCOSE: 104 mg/dL
Potassium: 3.5 mmol/L (ref 3.4–5.3)
SODIUM: 142 mmol/L (ref 137–147)

## 2015-05-25 LAB — HEPATIC FUNCTION PANEL
ALK PHOS: 94 U/L (ref 25–125)
ALT: 8 U/L (ref 7–35)
AST: 14 U/L (ref 13–35)
BILIRUBIN, TOTAL: 0.4 mg/dL

## 2015-05-25 LAB — CBC AND DIFFERENTIAL
HEMATOCRIT: 33 % — AB (ref 36–46)
HEMOGLOBIN: 10.7 g/dL — AB (ref 12.0–16.0)
Platelets: 280 10*3/uL (ref 150–399)
WBC: 8.6 10^3/mL

## 2015-06-02 ENCOUNTER — Non-Acute Institutional Stay (SKILLED_NURSING_FACILITY): Payer: Medicare Other | Admitting: Internal Medicine

## 2015-06-02 ENCOUNTER — Encounter: Payer: Self-pay | Admitting: Internal Medicine

## 2015-06-02 DIAGNOSIS — R05 Cough: Secondary | ICD-10-CM

## 2015-06-02 DIAGNOSIS — R059 Cough, unspecified: Secondary | ICD-10-CM

## 2015-06-02 NOTE — Progress Notes (Signed)
MRN: [id number] Name: Kara Wilkerson  Sex: female Age: 82 y.o. DOB: 11-05-1922  [facility] Facility/Room: Level Of Care: SNF Provider: Merrilee Seashore D Emergency Contacts: Extended Emergency Contact Information Primary Emergency Contact: Huntely,Douschkas Address: [address] [place], [place] [address] Darden Amber of [place] Home Phone: [phone] Relation: Niece Secondary Emergency Contact: [facility] Address: [address] [place], [place] [address] Darden Amber of [place] Mobile Phone: [phone] Relation: Other  Code Status:   Allergies: Codeine  Chief Complaint  Patient presents with  . Acute Visit    HPI: Patient is 82 y.o. female who is being seen for a cough that has gone on for a week. She says she is not worse but is not better and that is why she is still in bed. A week ago she was seen by the PA who ordered muccinex and nebs and CXR which was neg. One nurse I spoke to said the pt looked better, one said she looked worse although her MS and appetite was fine.There has been no fever or chillsor SOB.  Past Medical History  Diagnosis Date  . CHF (congestive heart failure) (HCC)   . A-fib (HCC)   . Hypertension   . Constipation   . Arthritis   . Chronic systolic heart failure (HCC)   . Dependence on supplemental oxygen   . Other chronic pain   . Contracture of ankle and foot joint   . Muscle weakness (generalized)   . Lack of coordination   . Unspecified essential hypertension   . Edema   . GERD (gastroesophageal reflux disease)   . Hyperlipidemia     Past Surgical History  Procedure Laterality Date  . Right ankle orif    . Fracture surgery        Medication List       This list is accurate as of: 06/02/15 11:59 PM.  Always use your most recent med list.               acetaminophen 500 MG tablet  Commonly known as:  TYLENOL  Take 1,000 mg by mouth 2 (two) times daily.      calcium-vitamin D 250-125 MG-UNIT tablet  Commonly known as:  OSCAL WITH D  Take 1 tablet by mouth 3 (three) times daily.     diclofenac sodium 1 % Gel  Commonly known as:  VOLTAREN  Apply 4 g topically 2 (two) times daily. To both knees     feeding supplement (ENSURE ENLIVE) Liqd  Take 237 mLs by mouth daily.     ferrous sulfate 325 (65 FE) MG tablet  Take 325 mg by mouth daily with breakfast.     fluticasone 50 MCG/ACT nasal spray  Commonly known as:  FLONASE  Place 2 sprays into the nose daily. For allergies     glucosamine-chondroitin 500-400 MG tablet  Take 2 tablets by mouth 2 (two) times daily. For osteoarthritis     guaiFENesin 100 MG/5ML Soln  Commonly known as:  ROBITUSSIN  Take 10 mLs by mouth every 6 (six) hours as needed for cough or to loosen phlegm.     lidocaine 5 %  Commonly known as:  LIDODERM  Place 1 patch onto the skin daily. Remove & Discard patch within 12 hours to left shoulder     lisinopril 2.5 MG tablet  Commonly known as:  PRINIVIL,ZESTRIL  Take 2.5 mg  by mouth daily. Hold for SBP less than 100.     magnesium hydroxide 400 MG/5ML suspension  Commonly known as:  MILK OF MAGNESIA  Take 60 mLs by mouth daily as needed for mild constipation or moderate constipation.     meclizine 12.5 MG tablet  Commonly known as:  ANTIVERT  Take 12.5 mg by mouth 2 (two) times daily as needed for dizziness. For dizziness     oxyCODONE 5 MG immediate release tablet  Commonly known as:  Oxy IR/ROXICODONE  Take one tablet by mouth every four hours as needed for pain     polyethylene glycol packet  Commonly known as:  MIRALAX / GLYCOLAX  Take 17 g by mouth daily. For constipation     potassium chloride SA 20 MEQ tablet  Commonly known as:  K-DUR,KLOR-CON  Take 2 tablets (40 mEq total) by mouth once.     Rivaroxaban 15 MG Tabs tablet  Commonly known as:  XARELTO  Take 15 mg by mouth daily with supper. Give 1 tablet by mouth daily after evening meal for A-Fib.      sennosides-docusate sodium 8.6-50 MG tablet  Commonly known as:  SENOKOT-S  Take 2 tablets by mouth at bedtime.     sertraline 50 MG tablet  Commonly known as:  ZOLOFT  Take 125 mg by mouth every morning. For depression     SYSTANE 0.4-0.3 % Soln  Generic drug:  Polyethyl Glycol-Propyl Glycol  Apply 1 drop to eye daily. Administer one drop in to each eye once daily for eye irritation.     torsemide 20 MG tablet  Commonly known as:  DEMADEX  Take 20 mg by mouth 2 (two) times daily. For congestive heart failure        No orders of the defined types were placed in this encounter.    Immunization History  Administered Date(s) Administered  . Influenza Whole 01/22/2013  . Influenza-Unspecified 01/26/2014  . PPD Test 11/20/2011    Social History  Substance Use Topics  . Smoking status: Never Smoker   . Smokeless tobacco: Never Used  . Alcohol Use: No    Review of Systems  DATA OBTAINED: from patient, nurse- asper HPI GENERAL:  no fevers, fatigue, appetite changes SKIN: No itching, rash HEENT: No complaint RESPIRATORY: + cough, no wheezing, no SOB CARDIAC: No chest pain, palpitations, lower extremity edema  GI: No abdominal pain, No N/V/D or constipation, No heartburn or reflux  GU: No dysuria, frequency or urgency, or incontinence  MUSCULOSKELETAL: No unrelieved bone/joint pain NEUROLOGIC: No headache, dizziness  PSYCHIATRIC: No overt anxiety or sadness  Filed Vitals:   06/02/15 1319  BP: 132/62  Pulse: 81  Temp: 97 F (36.1 C)  Resp: 18    Physical Exam  GENERAL APPEARANCE: Alert, conversant, No acute distress ; pt had one dry cough during interview SKIN: No diaphoresis rash HEENT: Unremarkable RESPIRATORY: Breathing is even, unlabored. Lung sounds are slt decreased, no rales, no wheezing  CARDIOVASCULAR: Heart RRR no murmurs, rubs or gallops. No peripheral edema  GASTROINTESTINAL: Abdomen is soft, non-tender, not distended w/ normal bowel sounds.   GENITOURINARY: Bladder non tender, not distended  MUSCULOSKELETAL: No abnormal joints or musculature NEUROLOGIC: Cranial nerves 2-12 grossly intact. Moves all extremities PSYCHIATRIC: Mood and affect appropriate to situation, no behavioral issues  Patient Active Problem List   Diagnosis Date Noted  . Cough 05/24/2015  . Syncope 11/30/2014  . FTT (failure to thrive) in adult 09/29/2014  . Palliative care encounter   .  Shortness of breath   . Symptomatic bradycardia 09/24/2014  . Acute renal failure superimposed on stage 4 chronic kidney disease (HCC) 01/09/2014  . Edema 09/03/2013  . Anemia 09/03/2013  . Depression 08/13/2013  . Vitamin D deficiency 08/13/2013  . Paroxysmal atrial fibrillation (HCC) 10/16/2012  . Essential hypertension, benign 10/16/2012  . Chronic combined systolic and diastolic CHF (congestive heart failure) (HCC) 10/16/2012  . Unspecified constipation 10/16/2012  . Osteoarthritis 10/16/2012  . Other malaise and fatigue 10/16/2012  . Incomplete tear of rotator cuff 09/30/2012    CBC    Component Value Date/Time   WBC 10.6* 09/23/2014 2321   WBC 9.8 07/03/2014   RBC 4.07 09/23/2014 2321   HGB 12.6 09/23/2014 2346   HCT 37.0 09/23/2014 2346   PLT 282 09/23/2014 2321   MCV 88.9 09/23/2014 2321   LYMPHSABS 1.8 09/23/2014 2321   MONOABS 0.6 09/23/2014 2321   EOSABS 0.0 09/23/2014 2321   BASOSABS 0.0 09/23/2014 2321    CMP     Component Value Date/Time   NA 138 09/25/2014 0323   NA 140 07/03/2014   K 5.1 09/25/2014 0323   CL 102 09/25/2014 0323   CO2 28 09/25/2014 0323   GLUCOSE 87 09/25/2014 0323   BUN 37* 09/25/2014 0323   BUN 21 07/03/2014   CREATININE 2.37* 09/25/2014 0323   CREATININE 1.6* 07/03/2014   CALCIUM 9.4 09/25/2014 0323   PROT 7.1 09/24/2014 0550   ALBUMIN 3.3* 09/24/2014 0550   AST 26 09/24/2014 0550   ALT 11* 09/24/2014 0550   ALKPHOS 95 09/24/2014 0550   BILITOT 1.2 09/24/2014 0550   GFRNONAA 17* 09/25/2014 0323   GFRAA  20* 09/25/2014 0323    Assessment and Plan  No problem-specific assessment & plan notes found for this encounter.   Margit Hanks, MD

## 2015-06-05 ENCOUNTER — Encounter: Payer: Self-pay | Admitting: Internal Medicine

## 2015-06-05 NOTE — Assessment & Plan Note (Signed)
My initial impression is this is not PNA, probably viral. Orders for muccinex 600 mg BID and were reordered and nebs had run out so nebs were scheduled TID for a week longer. If she is not out of bed when I get back I'm going to put her on anantibiotic.

## 2015-06-17 ENCOUNTER — Non-Acute Institutional Stay (SKILLED_NURSING_FACILITY): Payer: Medicare Other | Admitting: Internal Medicine

## 2015-06-17 ENCOUNTER — Encounter: Payer: Self-pay | Admitting: Internal Medicine

## 2015-06-17 DIAGNOSIS — I5042 Chronic combined systolic (congestive) and diastolic (congestive) heart failure: Secondary | ICD-10-CM

## 2015-06-17 DIAGNOSIS — I10 Essential (primary) hypertension: Secondary | ICD-10-CM

## 2015-06-17 DIAGNOSIS — N951 Menopausal and female climacteric states: Secondary | ICD-10-CM

## 2015-06-17 DIAGNOSIS — R609 Edema, unspecified: Secondary | ICD-10-CM

## 2015-06-17 DIAGNOSIS — R232 Flushing: Secondary | ICD-10-CM

## 2015-06-17 NOTE — Progress Notes (Signed)
[name], female   DOB: 1922/10/13, 82 y.o.   MRN: [id number]     MRN: [id number] Name: Kara Wilkerson  Sex: female Age: 82 y.o. DOB: 1923-01-05  [facility] #: [facility] Facility/Room:311 Level Of Care: SNF Provider: Roena Wilkerson Emergency Contacts: Extended Emergency Contact Information Primary Emergency Contact: Kara Wilkerson,Kara Wilkerson Address: [address] [place], [place] [address] Darden [place] of [place] Home Phone: [phone] Relation: Niece Secondary Emergency Contact: Kara Wilkerson Address: [address] [place], [place] [address] Darden Amber of [place] Phone: [phone] Relation: Other  Code Status: DNR  Allergies: Codeine  Chief Complaint  Patient presents with  . Acute Visit   including end-stage CHF atrial fibrillation-hypertension chronic pain- depression-anemia   HPI: Patient is 82 y.o. female who was admitted to SNF after being hospitalized for endstage dilated cardiomyopathy.--She was found to be bradycardic in the hospital with dizziness first degree AV block with ventricular rate of 30 bpm there was some thought of an ICD placement but thought not be appropriate with multiple medical comorbidities and advanced age and patient preference.  Her amiodarone and digoxin beta blockers were held and heart rate improved into the high 40s to low 50s and remained stable.--- Recent pulses  Appear to be more in the 70-80's range In regards to CHF  She continues on Demadex her weights apparentlyhave been fairly stable.  Most recent acute issue has been some repeated syncopal presyncopal episode she has-this is not new and has been occurring for a number of years.  Dr. Lyn Wilkerson did see her for follow-up of this-apparently during one  episode where the restorative tech actually thought that possibly she had died-but she quickly returned back to her baseline.  However patient did express to Dr. Lyn Wilkerson she  continues to want to have therapy and ambulate-it is a quality of life issue-  She was recently treated for cold-like symptoms with Mucinex and nebulizers this appears to have resolved fairly unremarkably she is not really complaining cold symptoms today.  She did say couple nights ago she felt like she was having hot and cold spells-however this has not reoccurred the past couple nights-her vital signs are stable she appears to be feeling relatively well today and in good spirits    Patient gradually appears to becoming weaker and declining but I would not say this is a precipitous change .  She is followed by psychiatry in several months ago her Zoloft was increased this appears to be stable     Past Medical History  Diagnosis Date  . CHF (congestive heart failure) (HCC)   . A-fib (HCC)   . Hypertension   . Constipation   . Arthritis   . Chronic systolic heart failure (HCC)   . Dependence on supplemental oxygen   . Other chronic pain   . Contracture of ankle and foot joint   . Muscle weakness (generalized)   . Lack of coordination   . Unspecified essential hypertension   . Edema   . GERD (gastroesophageal reflux disease)   . Hyperlipidemia     Past Surgical History  Procedure Laterality Date  . Right ankle orif    . Fracture surgery        Medication List       This list is accurate as of: 06/17/15 11:59 PM.  Always use your most recent med list.  acetaminophen 500 MG tablet  Commonly known as:  TYLENOL  Take 1,000 mg by mouth 2 (two) times daily.     calcium-vitamin D 250-125 MG-UNIT tablet  Commonly known as:  OSCAL WITH D  Take 1 tablet by mouth 3 (three) times daily.     diclofenac sodium 1 % Gel  Commonly known as:  VOLTAREN  Apply 4 g topically 2 (two) times daily. To both knees     feeding supplement (ENSURE ENLIVE) Liqd  Take 237 mLs by mouth daily.     ferrous sulfate 325 (65 FE) MG tablet  Take 325 mg by mouth daily with  breakfast.     fluticasone 50 MCG/ACT nasal spray  Commonly known as:  FLONASE  Place 2 sprays into the nose daily. For allergies     glucosamine-chondroitin 500-400 MG tablet  Take 2 tablets by mouth 2 (two) times daily. For osteoarthritis     guaiFENesin 100 MG/5ML Soln  Commonly known as:  ROBITUSSIN  Take 10 mLs by mouth every 6 (six) hours as needed for cough or to loosen phlegm.     lidocaine 5 %  Commonly known as:  LIDODERM  Place 1 patch onto the skin daily. Remove & Discard patch within 12 hours to left shoulder     lisinopril 2.5 MG tablet  Commonly known as:  PRINIVIL,ZESTRIL  Take 2.5 mg by mouth daily. Hold for SBP less than 100.     magnesium hydroxide 400 MG/5ML suspension  Commonly known as:  MILK OF MAGNESIA  Take 60 mLs by mouth daily as needed for mild constipation or moderate constipation.     meclizine 12.5 MG tablet  Commonly known as:  ANTIVERT  Take 12.5 mg by mouth 2 (two) times daily as needed for dizziness. For dizziness     oxyCODONE 5 MG immediate release tablet  Commonly known as:  Oxy IR/ROXICODONE  Take one tablet by mouth every four hours as needed for pain     polyethylene glycol packet  Commonly known as:  MIRALAX / GLYCOLAX  Take 17 g by mouth daily. For constipation     potassium chloride SA 20 MEQ tablet  Commonly known as:  K-DUR,KLOR-CON  Take 2 tablets (40 mEq total) by mouth once.     Rivaroxaban 15 MG Tabs tablet  Commonly known as:  XARELTO  Take 15 mg by mouth daily with supper. Give 1 tablet by mouth daily after evening meal for A-Fib.     sennosides-docusate sodium 8.6-50 MG tablet  Commonly known as:  SENOKOT-S  Take 2 tablets by mouth at bedtime.     sertraline 50 MG tablet  Commonly known as:  ZOLOFT  Take 125 mg by mouth every morning. For depression     SYSTANE 0.4-0.3 % Soln  Generic drug:  Polyethyl Glycol-Propyl Glycol  Apply 1 drop to eye daily. Administer one drop in to each eye once daily for eye  irritation.     torsemide 20 MG tablet  Commonly known as:  DEMADEX  Take 20 mg by mouth 2 (two) times daily. For congestive heart failure       of note her Zoloft has been increased to 125 mg daily by psychiatric services    Immunization History  Administered Date(s) Administered  . Influenza Whole 01/22/2013  . Influenza-Unspecified 01/26/2014  . PPD Test 11/20/2011    Social History  Substance Use Topics  . Smoking status: Never Smoker   . Smokeless tobacco: Never Used  .  Alcohol Use: No    Family history is noncontributory    Review of Systems  DATA OBTAINED: from patient, nurse GENERAL:  no fevers, fatigue, appetite changes SKIN: No itching, rash or wounds EYES: No eye pain, redness, discharge EARS: No earache, tinnitus, change in hearing NOSE: Does not complain of any increased congestion or drainage MOUTH/THROAT: No mouth or tooth pain, No sore throat RESPIRATORY:  Not complaining of shortness of breath or cough today CARDIAC: No chest pain, palpitations,has chronic lower extremity edema --frequent syncopal presyncopal episodes GI: No abdominal pain, No N/V/D or constipation, No heartburn or reflux  GU: Intermittent complaints of dysuria,  MUSCULOSKELETAL: No unrelieved bone/joint pain does complain of chronic shoulder discomfort more so of her left she does have an Lidoderm patch applied NEUROLOGIC: No headache, r focal weakness PSYCHIATRIC: No overt anxiety or sadness, No behavior issue.  Filed Vitals:   06/17/15 2201  BP: 117/81  Pulse: 90  Temp: 97.2 F (36.2 C)  Resp: 16    Physical Exam  GENERAL APPEARANCE: Alert, conversant,  No acute distress.-Lying in bed comfortably  SKIN: No diaphoresis rash HEAD: Normocephalic, atraumatic  EYES: Conjunctiva/lids clear. Pupils round, reactive. EOMs intact.  EARS: External exam WNL, canals clear. Hearing grossly normal.  NOSE: No deformity or discharge.  MOUTH/THROAT--oropharynx clear mucous membranes  moist RESPIRATORY: Breathing is even, unlabored.no overt congestion CARDIOVASCULAR: Heart RRR with occasional irregular beats no murmurs, rubs or gallops.   1 plus peripheral edema.--This appears stable --she does have her legs elevated in bed which I suspect help-  GASTROINTESTINAL: Abdomen is soft, non-tender, not distended w/ normal bowel sounds. GU-could not really appreciate CV tenderness--or suprapubic tenderness MUSCULOSKELETAL: No abnormal joints or musculature--does have lower extremity weakness but is able to walk with assistance with restorative NEUROLOGIC:  Cranial nerves 2-12 grossly intact is baseline lower extremity weakness PSYCHIATRIC: Mood and affect appropriate to situation, no behavioral issues-pleasant and appropriate  Patient Active Problem List   Diagnosis Date Noted  . Hot flashes 06/17/2015  . Cough 05/24/2015  . Syncope 11/30/2014  . FTT (failure to thrive) in adult 09/29/2014  . Palliative care encounter   . Shortness of breath   . Symptomatic bradycardia 09/24/2014  . Acute renal failure superimposed on stage 4 chronic kidney disease (HCC) 01/09/2014  . Edema 09/03/2013  . Anemia 09/03/2013  . Depression 08/13/2013  . Vitamin D deficiency 08/13/2013  . Paroxysmal atrial fibrillation (HCC) 10/16/2012  . Essential hypertension, benign 10/16/2012  . Chronic combined systolic and diastolic CHF (congestive heart failure) (HCC) 10/16/2012  . Unspecified constipation 10/16/2012  . Osteoarthritis 10/16/2012  . Other malaise and fatigue 10/16/2012  . Incomplete tear of rotator cuff 09/30/2012       Labs  05/25/2015.  Sodium 142 potassium 3.5 BUN 29 creatinine 1.6.  B13.3 otherwise liver function tests within normal limits.  WBC 8.6 hemoglobin 10.7 platelets 280 03/29/2016.  Sodium 137 potassium 3.9 BUN 25 creatinine 1.5.  WBC 10.5 hemoglobin 10.3 platelets 353  02/10/2015.  Sodium 138 potassium 3.5 BUN 28 creatinine 1.4.  WBC 9.2 hemoglobin  11.7 platelets 295.  Marland Kitchen  12/01/2014.  Sodium 139 potassium 3.7 BUN 21 creatinine 1.4-.  Liver function tests within normal limits except albumin of 3.4.  WBC 8.8 hemoglobin 11.3 platelets 297.  TSH-1.81   CBC    Component Value Date/Time   WBC 10.6* 09/23/2014 2321   WBC 9.8 07/03/2014   RBC 4.07 09/23/2014 2321   HGB 12.6 09/23/2014 2346   HCT 37.0 09/23/2014  2346   PLT 282 09/23/2014 2321   MCV 88.9 09/23/2014 2321   LYMPHSABS 1.8 09/23/2014 2321   MONOABS 0.6 09/23/2014 2321   EOSABS 0.0 09/23/2014 2321   BASOSABS 0.0 09/23/2014 2321    CMP     Component Value Date/Time   NA 138 09/25/2014 0323   NA 140 07/03/2014   K 5.1 09/25/2014 0323   CL 102 09/25/2014 0323   CO2 28 09/25/2014 0323   GLUCOSE 87 09/25/2014 0323   BUN 37* 09/25/2014 0323   BUN 21 07/03/2014   CREATININE 2.37* 09/25/2014 0323   CREATININE 1.6* 07/03/2014   CALCIUM 9.4 09/25/2014 0323   PROT 7.1 09/24/2014 0550   ALBUMIN 3.3* 09/24/2014 0550   AST 26 09/24/2014 0550   ALT 11* 09/24/2014 0550   ALKPHOS 95 09/24/2014 0550   BILITOT 1.2 09/24/2014 0550   GFRNONAA 17* 09/25/2014 0323   GFRAA 20* 09/25/2014 0323    Assessment and Plan  Bradycardia-again this was worked up in the hospital as noted above amiodarone and digoxin and beta blockers were discontinued-again ICD /pacemaker placement deferred secondary to her comorbidities advanced age and patient's wishes this has been stable pulses recently 70-80's  this is what I got on exam today  Continues to have presyncopal and syncopal episodes as noted above the patient desires to continue working with restorative ambulation  to-my knowledge last couple months there are not been any episodes.  Chronic combined systolic and diastolic CHF-t She continues on Demadex 20 mg BID-i--she is on low dose ACE inhibitor as well-- not a good candidate for beta blocker again secondary to her bradycardia issues.- At times she will increase her Demadex  slightly but at this pointr edema appears to be stable  #3 acute renal failure superimposed on stage IV chronic kidney disease-creatinine of 1.6 on lab done February 14 appears relatively baseline Will update this  #4-history of atrial fibrillation she is on Xarelto prophylaxis no rate control needed as noted above this has been stable.  #5 depression she is on Zoloft-this has been increased by psychiatric services.  Anemia she is on iron Will update CBC--most recent hemoglobin 10.7-on February 14    #7 chronic pain she is on oxycodone 5 mg every 4 hours when necessary pain she is not complaining of discomfort today suspect she does have some discomfort when ambulating but she does not complaining of acute changes here She also has a lidocaine patch for left shoulder pain which is chronic.  #8hypertension-his appears stable recent blood pressures 117/80 1-102/70  105/68  she is on low dose lisinopril again her beta blocker has been discontinued nonetheless blood pressures appear to be stable.  Number  9 a history of dizziness she has been evaluated by cardiology she is on Antivert as needed--she is not complaining of dizziness today   #10- Hot and cold spells-apparently this is infrequent and just occurred for one night-no further episodes-will check a TSH and monitor for any recurrence she appears to be asymptomatic today  .  [id number]--of note greater than 35 minutes spent assessing patient-discussing her concerns at bedside-reviewing her chart-and coordinating and formulating a plan of care for numerous diagnoses-of note greater than 50% of time spent coordinating plan of care Krist[place]her [place] C,

## 2015-06-23 LAB — TSH: TSH: 1.67 u[IU]/mL (ref 0.41–5.90)

## 2015-06-23 LAB — BASIC METABOLIC PANEL
BUN: 25 mg/dL — AB (ref 4–21)
Creatinine: 1.3 mg/dL — AB (ref 0.5–1.1)
Glucose: 73 mg/dL
POTASSIUM: 3.5 mmol/L (ref 3.4–5.3)
Sodium: 139 mmol/L (ref 137–147)

## 2015-06-23 LAB — CBC AND DIFFERENTIAL
HCT: 32 % — AB (ref 36–46)
HEMOGLOBIN: 10.5 g/dL — AB (ref 12.0–16.0)
Platelets: 315 10*3/uL (ref 150–399)
WBC: 8.2 10*3/mL

## 2015-07-20 ENCOUNTER — Non-Acute Institutional Stay (SKILLED_NURSING_FACILITY): Payer: Medicare Other | Admitting: Internal Medicine

## 2015-07-20 ENCOUNTER — Encounter: Payer: Self-pay | Admitting: Internal Medicine

## 2015-07-20 DIAGNOSIS — D649 Anemia, unspecified: Secondary | ICD-10-CM

## 2015-07-20 DIAGNOSIS — N183 Chronic kidney disease, stage 3 unspecified: Secondary | ICD-10-CM

## 2015-07-20 DIAGNOSIS — F329 Major depressive disorder, single episode, unspecified: Secondary | ICD-10-CM

## 2015-07-20 DIAGNOSIS — F32A Depression, unspecified: Secondary | ICD-10-CM

## 2015-07-20 NOTE — Progress Notes (Signed)
MRN: [id number] Name: Kara Wilkerson  Sex: female Age: 82 y.o. DOB: August 01, 1922  [facility] #: [facility] Facility/Room: Level Of Care: SNF Provider: Merrilee Seashore D Emergency Contacts: Extended Emergency Contact Information Primary Emergency Contact: Huntely,Douschkas Address: [address] [place], [place] [address] Darden Amber of [place] Home Phone: [phone] Relation: Niece Secondary Emergency Contact: [facility] Address: [address] [place], [place] [address] Darden Amber of [place] Mobile Phone: [phone] Relation: Other  Code Status:   Allergies: Codeine  Chief Complaint  Patient presents with  . Medical Management of Chronic Issues    HPI: Patient is 82 y.o. female who is being seen for routinr issues of anemai, CKD3 and depression.  Past Medical History  Diagnosis Date  . CHF (congestive heart failure) (HCC)   . A-fib (HCC)   . Hypertension   . Constipation   . Arthritis   . Chronic systolic heart failure (HCC)   . Dependence on supplemental oxygen   . Other chronic pain   . Contracture of ankle and foot joint   . Muscle weakness (generalized)   . Lack of coordination   . Unspecified essential hypertension   . Edema   . GERD (gastroesophageal reflux disease)   . Hyperlipidemia     Past Surgical History  Procedure Laterality Date  . Right ankle orif    . Fracture surgery        Medication List       This list is accurate as of: 07/20/15 11:59 PM.  Always use your most recent med list.               acetaminophen 500 MG tablet  Commonly known as:  TYLENOL  Take 1,000 mg by mouth 2 (two) times daily.     calcium-vitamin D 250-125 MG-UNIT tablet  Commonly known as:  OSCAL WITH D  Take 1 tablet by mouth 3 (three) times daily.     diclofenac sodium 1 % Gel  Commonly known as:  VOLTAREN  Apply 4 g topically 2 (two) times daily. To both knees     feeding supplement (ENSURE ENLIVE) Liqd  Take  237 mLs by mouth daily.     ferrous sulfate 325 (65 FE) MG tablet  Take 325 mg by mouth daily with breakfast.     fluticasone 50 MCG/ACT nasal spray  Commonly known as:  FLONASE  Place 2 sprays into the nose daily. For allergies     glucosamine-chondroitin 500-400 MG tablet  Take 2 tablets by mouth 2 (two) times daily. For osteoarthritis     guaiFENesin 100 MG/5ML Soln  Commonly known as:  ROBITUSSIN  Take 10 mLs by mouth every 6 (six) hours as needed for cough or to loosen phlegm.     lidocaine 5 %  Commonly known as:  LIDODERM  Place 1 patch onto the skin daily. Remove & Discard patch within 12 hours to left shoulder     lisinopril 2.5 MG tablet  Commonly known as:  PRINIVIL,ZESTRIL  Take 2.5 mg by mouth daily. Hold for SBP less than 100.     magnesium hydroxide 400 MG/5ML suspension  Commonly known as:  MILK OF MAGNESIA  Take 60 mLs by mouth daily as needed for mild constipation or moderate constipation.     meclizine 12.5 MG tablet  Commonly known as:  ANTIVERT  Take 12.5 mg by mouth 2 (two) times daily as  needed for dizziness. For dizziness     oxyCODONE 5 MG immediate release tablet  Commonly known as:  Oxy IR/ROXICODONE  Take one tablet by mouth every four hours as needed for pain     polyethylene glycol packet  Commonly known as:  MIRALAX / GLYCOLAX  Take 17 g by mouth daily. For constipation     potassium chloride SA 20 MEQ tablet  Commonly known as:  K-DUR,KLOR-CON  Take 2 tablets (40 mEq total) by mouth once.     Rivaroxaban 15 MG Tabs tablet  Commonly known as:  XARELTO  Take 15 mg by mouth daily with supper. Give 1 tablet by mouth daily after evening meal for A-Fib.     sennosides-docusate sodium 8.6-50 MG tablet  Commonly known as:  SENOKOT-S  Take 2 tablets by mouth at bedtime.     sertraline 50 MG tablet  Commonly known as:  ZOLOFT  Take 125 mg by mouth every morning. For depression     SYSTANE 0.4-0.3 % Soln  Generic drug:  Polyethyl  Glycol-Propyl Glycol  Apply 1 drop to eye daily. Administer one drop in to each eye once daily for eye irritation.     torsemide 20 MG tablet  Commonly known as:  DEMADEX  Take 20 mg by mouth 2 (two) times daily. For congestive heart failure        No orders of the defined types were placed in this encounter.    Immunization History  Administered Date(s) Administered  . Influenza Whole 01/22/2013  . Influenza-Unspecified 01/26/2014  . PPD Test 11/20/2011    Social History  Substance Use Topics  . Smoking status: Never Smoker   . Smokeless tobacco: Never Used  . Alcohol Use: No    Review of Systems  DATA OBTAINED: from patient, nurse GENERAL:  no fevers, fatigue, appetite changes SKIN: No itching, rash HEENT: No complaint RESPIRATORY: No cough, wheezing, SOB CARDIAC: No chest pain, palpitations, lower extremity edema  GI: No abdominal pain, No N/V/D or constipation, No heartburn or reflux  GU: No dysuria, frequency or urgency, or incontinence  MUSCULOSKELETAL: No unrelieved bone/joint pain NEUROLOGIC: No headache, dizziness  PSYCHIATRIC: No overt anxiety or sadness  Filed Vitals:   07/20/15 1653  BP: 125/79  Pulse: 74  Temp: 97 F (36.1 C)  Resp: 16    Physical Exam  GENERAL APPEARANCE: Alert, conversant, No acute distress  SKIN: No diaphoresis rash HEENT: Unremarkable RESPIRATORY: Breathing is even, unlabored. Lung sounds are clear   CARDIOVASCULAR: Heart RRR no murmurs, rubs or gallops. No peripheral edema  GASTROINTESTINAL: Abdomen is soft, non-tender, not distended w/ normal bowel sounds.  GENITOURINARY: Bladder non tender, not distended  MUSCULOSKELETAL: No abnormal joints or musculature NEUROLOGIC: Cranial nerves 2-12 grossly intact. Moves all extremities PSYCHIATRIC: Mood and affect appropriate to situation, no behavioral issues  Patient Active Problem List   Diagnosis Date Noted  . CKD (chronic kidney disease) stage 3, GFR 30-59 ml/min  07/24/2015  . Hot flashes 06/17/2015  . Cough 05/24/2015  . Syncope 11/30/2014  . FTT (failure to thrive) in adult 09/29/2014  . Palliative care encounter   . Shortness of breath   . Symptomatic bradycardia 09/24/2014  . Acute renal failure superimposed on stage 4 chronic kidney disease (HCC) 01/09/2014  . Edema 09/03/2013  . Anemia 09/03/2013  . Depression 08/13/2013  . Vitamin D deficiency 08/13/2013  . Paroxysmal atrial fibrillation (HCC) 10/16/2012  . Essential hypertension, benign 10/16/2012  . Chronic combined systolic and diastolic CHF (  congestive heart failure) (HCC) 10/16/2012  . Unspecified constipation 10/16/2012  . Osteoarthritis 10/16/2012  . Other malaise and fatigue 10/16/2012  . Incomplete tear of rotator cuff 09/30/2012    CBC    Component Value Date/Time   WBC 10.6* 09/23/2014 2321   WBC 9.8 07/03/2014   RBC 4.07 09/23/2014 2321   HGB 12.6 09/23/2014 2346   HCT 37.0 09/23/2014 2346   PLT 282 09/23/2014 2321   MCV 88.9 09/23/2014 2321   LYMPHSABS 1.8 09/23/2014 2321   MONOABS 0.6 09/23/2014 2321   EOSABS 0.0 09/23/2014 2321   BASOSABS 0.0 09/23/2014 2321    CMP     Component Value Date/Time   NA 138 09/25/2014 0323   NA 140 07/03/2014   K 5.1 09/25/2014 0323   CL 102 09/25/2014 0323   CO2 28 09/25/2014 0323   GLUCOSE 87 09/25/2014 0323   BUN 37* 09/25/2014 0323   BUN 21 07/03/2014   CREATININE 2.37* 09/25/2014 0323   CREATININE 1.6* 07/03/2014   CALCIUM 9.4 09/25/2014 0323   PROT 7.1 09/24/2014 0550   ALBUMIN 3.3* 09/24/2014 0550   AST 26 09/24/2014 0550   ALT 11* 09/24/2014 0550   ALKPHOS 95 09/24/2014 0550   BILITOT 1.2 09/24/2014 0550   GFRNONAA 17* 09/25/2014 0323   GFRAA 20* 09/25/2014 0323    Assessment and Plan  Anemia 3/4 Hb 10.5; it was 10.7 prior; stable n iron so cont current tx  CKD (chronic kidney disease) stage 3, GFR 30-59 ml/min Pt with improvement in renal fx,GFR 37 with BUN 25/Cr 1.28, Cr 1.6 prior;will monitor at  intervals  Depression Stable, pt is in good spirits; cont zoloft 125mg  daily    Margit Hanks, MD

## 2015-07-24 ENCOUNTER — Encounter: Payer: Self-pay | Admitting: Internal Medicine

## 2015-07-24 DIAGNOSIS — N183 Chronic kidney disease, stage 3 unspecified: Secondary | ICD-10-CM | POA: Insufficient documentation

## 2015-07-24 NOTE — Assessment & Plan Note (Signed)
3/4 Hb 10.5; it was 10.7 prior; stable n iron so cont current tx

## 2015-07-24 NOTE — Assessment & Plan Note (Signed)
Stable, pt is in good spirits; cont zoloft 125mg  daily

## 2015-07-24 NOTE — Assessment & Plan Note (Signed)
Pt with improvement in renal fx,GFR 37 with BUN 25/Cr 1.28, Cr 1.6 prior;will monitor at intervals

## 2015-08-05 ENCOUNTER — Non-Acute Institutional Stay (SKILLED_NURSING_FACILITY): Payer: Medicare Other | Admitting: Internal Medicine

## 2015-08-05 ENCOUNTER — Encounter: Payer: Self-pay | Admitting: Internal Medicine

## 2015-08-05 DIAGNOSIS — I5042 Chronic combined systolic (congestive) and diastolic (congestive) heart failure: Secondary | ICD-10-CM

## 2015-08-05 DIAGNOSIS — R112 Nausea with vomiting, unspecified: Secondary | ICD-10-CM | POA: Diagnosis not present

## 2015-08-05 DIAGNOSIS — R42 Dizziness and giddiness: Secondary | ICD-10-CM

## 2015-08-05 NOTE — Progress Notes (Signed)
Location:  [facility] Room Number: 311-W Place of Service:  SNF (31)   No primary care provider on file.  No care team member to display  Extended Emergency Contact Information Primary Emergency Contact: Huntely,Douschkas Address: [address] [place], [place] [address] Darden Amber of [place] Home Phone: [phone] Relation: Niece Secondary Emergency Contact: [facility] Address: [address] [place], [place] [address] [place] of [place] Phone: [phone] Relation: Other   Goals of care: Advanced Directive information Advanced Directives 08/05/2015  Does patient have an advance directive? Yes  Type of Advance Directive Out of facility DNR (pink MOST or yellow form)  Does patient want to make changes to advanced directive? No - Patient declined  Copy of advanced directive(s) in chart? No - copy requested  Would patient like information on creating an advanced directive? No - patient declined information     Chief Complaint  Patient presents with  . Acute Visit  Secondary to vomiting episode yesterday-complaints of feeling dizzy at times  HPI:  Pt is a 82 y.o. female seen today for an acute visit for a vomiting episode yesterday and complaints of intermittent dizziness.  Patient says she feels a bit better today there has been no further vomiting she did not eat breakfast today but did eat lunch and apparently tolerated this well.  She currently does not complain of any abdominal pain nausea or dysphagia.  She also is complaining of intermittent dizziness although this is not a new issue.  She does have an Antivert as needed when necessary.  Cardiology has evaluated this as well.  She does have a history of some syncopal presyncopal episodes that are fairly frequent at times-this has been addressed by cardiology as well as Dr. August Saucer does remain at risk with a  history of significant heart issues including dilated cardiomyopathy with combined systolic and diastolic CHF.  She continues to want to ambulate however with restorative realizing the risks and this was extensively discussed with her by Dr. Lyn Hollingshead.  She does continue on Demadex 20 mg twice a day and potassium 40 mEq a day-she is also on an ACE inhibitor she's not on a beta blocker secondary to history of significant bradycardia which at one time required hospitalization.  At that time her beta blocker digoxin and amiodarone were discontinued.  Patient actually appears to be doing quite well considering her numerous comorbidities  Currently she is not complaining of any dizziness .     Past Medical History  Diagnosis Date  . CHF (congestive heart failure) (HCC)   . A-fib (HCC)   . Hypertension   . Constipation   . Arthritis   . Chronic systolic heart failure (HCC)   . Dependence on supplemental oxygen   . Other chronic pain   . Contracture of ankle and foot joint   . Muscle weakness (generalized)   . Lack of coordination   . Unspecified essential hypertension   . Edema   . GERD (gastroesophageal reflux disease)   . Hyperlipidemia    Past Surgical History  Procedure Laterality Date  . Right ankle orif    . Fracture surgery      Allergies  Allergen Reactions  . Codeine Other (See Comments)    Unk      Medication List       This list is accurate as of: 08/05/15  1:25 PM.  Always use your most recent med list.               acetaminophen 500 MG tablet  Commonly known as:  TYLENOL  Take 1,000 mg by mouth 2 (two) times daily.     calcium-vitamin D 250-125 MG-UNIT tablet  Commonly known as:  OSCAL WITH D  Take 1 tablet by mouth 3 (three) times daily.     diclofenac sodium 1 % Gel  Commonly known as:  VOLTAREN  Apply 4 g topically 2 (two) times daily. To both knees     feeding supplement (ENSURE ENLIVE) Liqd  Take 237 mLs by mouth daily.     ferrous  sulfate 325 (65 FE) MG tablet  Take 325 mg by mouth daily with breakfast.     fluticasone 50 MCG/ACT nasal spray  Commonly known as:  FLONASE  Place 2 sprays into the nose daily. For allergies     glucosamine-chondroitin 500-400 MG tablet  Take 2 tablets by mouth 2 (two) times daily. For osteoarthritis     guaiFENesin 100 MG/5ML Soln  Commonly known as:  ROBITUSSIN  Take 10 mLs by mouth every 6 (six) hours as needed for cough or to loosen phlegm.     lidocaine 5 %  Commonly known as:  LIDODERM  Place 1 patch onto the skin daily. Remove & Discard patch within 12 hours to left shoulder     lisinopril 2.5 MG tablet  Commonly known as:  PRINIVIL,ZESTRIL  Take 2.5 mg by mouth daily. Hold for SBP less than 100.     magnesium hydroxide 400 MG/5ML suspension  Commonly known as:  MILK OF MAGNESIA  Take 60 mLs by mouth daily as needed for mild constipation or moderate constipation.     meclizine 12.5 MG tablet  Commonly known as:  ANTIVERT  Take 12.5 mg by mouth 2 (two) times daily as needed for dizziness. For dizziness     oxyCODONE 5 MG immediate release tablet  Commonly known as:  Oxy IR/ROXICODONE  Take one tablet by mouth every four hours as needed for pain     polyethylene glycol packet  Commonly known as:  MIRALAX / GLYCOLAX  Take 17 g by mouth daily. For constipation     potassium chloride SA 20 MEQ tablet  Commonly known as:  K-DUR,KLOR-CON  Take 2 tablets (40 mEq total) by mouth once.     Rivaroxaban 15 MG Tabs tablet  Commonly known as:  XARELTO  Take 15 mg by mouth daily with supper. Give 1 tablet by mouth daily after evening meal for A-Fib.     sennosides-docusate sodium 8.6-50 MG tablet  Commonly known as:  SENOKOT-S  Take 2 tablets by mouth at bedtime.     sertraline 50 MG tablet  Commonly known as:  ZOLOFT  Take 125 mg by mouth every morning. For depression     SYSTANE 0.4-0.3 % Soln  Generic drug:  Polyethyl Glycol-Propyl Glycol  Apply 1 drop to eye  daily. Administer one drop in to each eye once daily for eye irritation.     torsemide 20 MG tablet  Commonly known as:  DEMADEX  Take 20 mg by mouth 2 (two) times daily. For congestive heart failure        Review of Systems   In general is not complaining of fever or chills.  Says she feels weak.  Skin does not complain of rashes or itching.  Head ears eyes nose mouth and throat  does not complaining of any sore throat or visual changes from baseline.  Respiratory is not currently complaining of any shortness of breath or  Cardiac does not complain of chest pain currently baseline lower extremity edema actually this appears improved whenever she has her legs elevated in bed.  GI is not currently complaining of any nausea or vomiting constipation diarrhea or abdominal discomfort.  Musculoskeletal has significant lower extremity weakness which is not new does not complain of joint pain currently has a history of left shoulder pain but this appears to be more stabilized recently although she still at times will complain of discomfort.  Neurologic is not complaining of dizziness headache or syncopal-type feelings currently but remains at risk for syncope as well as dizziness with a history of vertigo as well.  Psych continues to be pleasant and appropriate is not complaining of overt anxiety or depression.    Immunization History  Administered Date(s) Administered  . Influenza Whole 01/22/2013  . Influenza-Unspecified 01/26/2014  . PPD Test 11/20/2011   Pertinent  Health Maintenance Due  Topic Date Due  . DEXA SCAN  04/03/1990  . PNA vac Low Risk Adult (1 of 2 - PCV13) 04/03/1990  . INFLUENZA VACCINE  11/09/2015   No flowsheet data found. Functional Status Survey:    Filed Vitals:   08/05/15 1302  BP: 104/64  Pulse: 66  Temp: 97.2 F (36.2 C)  TempSrc: Oral  Resp: 20    Physical Exam   In general is a pleasant elderly female in no distress lying in bed  comfortably.  Her skin is warm and dry.  Oropharynx clear mucous membranes moist.  Chest is clear to auscultation there is no labored breathing.  Heart is regular irregular rate and rhythm in the 80s-she has 1-2+ lower extremity edema which actually appears to be improved since she has some in bed elevated.  Abdomen is soft obese nontender with positive bowel sounds.  Muscle skeletal does move all screws 4 limited exam since she is in bed but I do not see any changes from baseline upper extremity strength appears preserved.  Neurologic is grossly intact her speech is clear cranial nerves intact no lateralizing findings.  Psych she is grossly alert and oriented pleasant and conversant.  Labs.  06/23/2015.  WBC 8.2 hemoglobin 10.5 platelets 3:15.  Sodium 139 potassium 3.5 BUN 25 creatinine 1.28.  TSH-1.67.    Labs reviewed:  Recent Labs  09/23/14 2321 09/23/14 2346 09/24/14 0550 09/25/14 0323  NA 139 139 139 138  K 4.7 4.7 4.7 5.1  CL 101 101 100* 102  CO2 27  --  28 28  GLUCOSE 103* 102* 86 87  BUN 26* 29* 26* 37*  CREATININE 1.71* 1.80* 1.87* 2.37*  CALCIUM 9.7  --  9.5 9.4    Recent Labs  09/23/14 2309 09/24/14 0550  AST 18 26  ALT 13* 11*  ALKPHOS 103 95  BILITOT 0.7 1.2  PROT 7.7 7.1  ALBUMIN 3.6 3.3*    Recent Labs  09/23/14 2321 09/23/14 2346  WBC 10.6*  --   NEUTROABS 8.1*  --   HGB 12.0 12.6  HCT 36.2 37.0  MCV 88.9  --   PLT 282  --    Lab Results  Component Value Date   TSH 1.135 09/24/2014        Assessment/Plan  #1-history of dizziness-this does not appear to be new --at this point will monitor she does have Antivert as needed when necessary-she also has  a history of syncope which has been somewhat recurrent again this has been addressed fairly extensively in the past-she continues at risk with this and is aware of the risks as noted above-at this point she appears to be stable although a very fragile individual --at this  point will monitor with no medication changes.  #2 history of vomiting-apparently this was a one time episode yesterday there's been no reoccurrence she is not complaining of any abdominal pain or discomfort now will update her electrolytes as well as CBC these have been stable and appears for a while.  #3 history of diastolic and systolic CHF with dilated cardiomyopathy-again she is very fragile in this regards but appears to be stable according to staff she has possibly gained 2 or 3 pounds recently not a dramatic increase-I have spoken with staff and they will weigh her tomorrow for updated values  She continues on Demadex 20 mg twice a day with potassium supplementation formula colons a day.  Again will update a metabolic panel keep 9 on her electrolytes and renal function.  [id number]

## 2015-08-08 NOTE — Progress Notes (Signed)
[name], female   DOB: 1922/11/03, 82 y.o.   MRN: [id number]

## 2015-08-09 ENCOUNTER — Non-Acute Institutional Stay (SKILLED_NURSING_FACILITY): Payer: Medicare Other | Admitting: Internal Medicine

## 2015-08-09 ENCOUNTER — Encounter: Payer: Self-pay | Admitting: Internal Medicine

## 2015-08-09 DIAGNOSIS — R112 Nausea with vomiting, unspecified: Secondary | ICD-10-CM

## 2015-08-09 DIAGNOSIS — I5042 Chronic combined systolic (congestive) and diastolic (congestive) heart failure: Secondary | ICD-10-CM | POA: Diagnosis not present

## 2015-08-09 DIAGNOSIS — I480 Paroxysmal atrial fibrillation: Secondary | ICD-10-CM

## 2015-08-09 DIAGNOSIS — N183 Chronic kidney disease, stage 3 unspecified: Secondary | ICD-10-CM

## 2015-08-09 LAB — BASIC METABOLIC PANEL
BUN: 32 mg/dL — AB (ref 4–21)
CREATININE: 1.7 mg/dL — AB (ref 0.5–1.1)
GLUCOSE: 91 mg/dL
Potassium: 3.9 mmol/L (ref 3.4–5.3)
Sodium: 140 mmol/L (ref 137–147)

## 2015-08-09 LAB — CBC AND DIFFERENTIAL
HEMATOCRIT: 31 % — AB (ref 36–46)
Hemoglobin: 9.9 g/dL — AB (ref 12.0–16.0)
PLATELETS: 427 10*3/uL — AB (ref 150–399)
WBC: 6.5 10*3/mL

## 2015-08-09 LAB — HEPATIC FUNCTION PANEL
ALK PHOS: 78 U/L (ref 25–125)
ALT: 10 U/L (ref 7–35)
AST: 11 U/L — AB (ref 13–35)
BILIRUBIN, TOTAL: 0.3 mg/dL

## 2015-08-09 NOTE — Progress Notes (Signed)
[name], female   DOB: April 27, 1922, 82 y.o.   MRN: [id number]      MRN: [id number] Name: Kara Wilkerson  Sex: female Age: 82 y.o. DOB: [birth year]  [facility] #: [facility] Facility/Room:311 Level Of Care: SNF Provider: Edmon Crape Emergency Contacts: Extended Emergency Contact Information Primary Emergency Contact: Huntely,Douschkas Address: [address] [place], [place] [address] Darden Amber of [place] Home Phone: [phone] Relation: Niece Secondary Emergency Contact: [facility] Address: [address] [place], [place] [address] Darden [place] of [place] Phone: [phone] Relation: Other  Code Status: DNR  Allergies: Codeine  Chief Complaint  Patient presents with  . Acute Visit   Secondary to vomiting episode  Medical management of chronic issues including CHF-atrial fibrillation-hypertension-chronic pain-   HPI: Patient is 82 y.o. female who was admitted to SNF after being hospitalized for endstage dilated cardiomyopathy.--She was found to be bradycardic in the hospital with dizziness first degree AV block with ventricular rate of 30 bpm there was some thought of an ICD placement but thought not be appropriate with multiple medical comorbidities and advanced age and patient preference.  Her amiodarone and digoxin beta blockers were held and heart rate improved into the high 40s to low 50s and remained stable.--- Recent pulses  Appear to be more in the 70-80's range In regards to CHF  She continues on Demadex her weights apparentlyhave been fairly stable although recently possibly is gained about a 3 pounds-although there is some scale variability  Patient did have a vomiting episode earlier today apparently showed a vomiting episode several days ago as well this was undigested food apparently-she does not complain of any acute abdominal discomfort-but she is somewhat concerned about the vomiting.  Regards  to other issues. She does have a history of some repeated syncopal presyncopal episode she has-this is not new and has been occurring for a number of years.  Dr. Lyn Hollingshead did see her for follow-up of this-apparently during one  episode where the restorative tech actually thought that possibly she had died-but she quickly returned back to her baseline.  However patient did express to Dr. Lyn Hollingshead she continues to want to have therapy and ambulate-it is a quality of life issue-  Considering her numerous comorbidities  she actually has been quite stable and done well with this   At times she will complain of "hot flashes"-this is quite intermittent however and previous labs have not been very remarkable.  She also has complained of dizziness at times-she does have an order for Antivert as needed although I'm not sure that she asked for this much she has been encouraged to use this.  Currently she is not complaining of any shortness of breath chest pain or acute abdominal discomfort she is concerned about a vomiting episode earlier today however     .  She is followed by psychiatry in several months ago her Zoloft was increased this appears to be stable     Past Medical History  Diagnosis Date  . CHF (congestive heart failure) (HCC)   . A-fib (HCC)   . Hypertension   . Constipation   . Arthritis   . Chronic systolic heart failure (HCC)   . Dependence on supplemental oxygen   . Other chronic pain   . Contracture of ankle and foot joint   . Muscle weakness (generalized)   . Lack of coordination   .  Unspecified essential hypertension   . Edema   . GERD (gastroesophageal reflux disease)   . Hyperlipidemia     Past Surgical History  Procedure Laterality Date  . Right ankle orif    . Fracture surgery        Medication List       This list is accurate as of: 08/09/15 11:59 PM.  Always use your most recent med list.               acetaminophen 500 MG tablet  Commonly  known as:  TYLENOL  Take 1,000 mg by mouth 2 (two) times daily.     calcium-vitamin D 250-125 MG-UNIT tablet  Commonly known as:  OSCAL WITH D  Take 1 tablet by mouth 3 (three) times daily.     diclofenac sodium 1 % Gel  Commonly known as:  VOLTAREN  Apply 4 g topically 2 (two) times daily. To both knees     feeding supplement (ENSURE ENLIVE) Liqd  Take 237 mLs by mouth daily.     ferrous sulfate 325 (65 FE) MG tablet  Take 325 mg by mouth daily with breakfast.     fluticasone 50 MCG/ACT nasal spray  Commonly known as:  FLONASE  Place 2 sprays into the nose daily. For allergies     glucosamine-chondroitin 500-400 MG tablet  Take 2 tablets by mouth 2 (two) times daily. For osteoarthritis     guaiFENesin 100 MG/5ML Soln  Commonly known as:  ROBITUSSIN  Take 10 mLs by mouth every 6 (six) hours as needed for cough or to loosen phlegm.     lidocaine 5 %  Commonly known as:  LIDODERM  Place 1 patch onto the skin daily. Remove & Discard patch within 12 hours to left shoulder     lisinopril 2.5 MG tablet  Commonly known as:  PRINIVIL,ZESTRIL  Take 2.5 mg by mouth daily. Hold for SBP less than 100.     magnesium hydroxide 400 MG/5ML suspension  Commonly known as:  MILK OF MAGNESIA  Take 60 mLs by mouth daily as needed for mild constipation or moderate constipation.     meclizine 12.5 MG tablet  Commonly known as:  ANTIVERT  Take 12.5 mg by mouth 2 (two) times daily as needed for dizziness. For dizziness     oxyCODONE 5 MG immediate release tablet  Commonly known as:  Oxy IR/ROXICODONE  Take one tablet by mouth every four hours as needed for pain     polyethylene glycol packet  Commonly known as:  MIRALAX / GLYCOLAX  Take 17 g by mouth daily. For constipation     potassium chloride SA 20 MEQ tablet  Commonly known as:  K-DUR,KLOR-CON  Take 2 tablets (40 mEq total) by mouth once.     Rivaroxaban 15 MG Tabs tablet  Commonly known as:  XARELTO  Take 15 mg by mouth daily  with supper. Give 1 tablet by mouth daily after evening meal for A-Fib.     sennosides-docusate sodium 8.6-50 MG tablet  Commonly known as:  SENOKOT-S  Take 2 tablets by mouth at bedtime.     sertraline 50 MG tablet  Commonly known as:  ZOLOFT  Take 125 mg by mouth every morning. For depression     SYSTANE 0.4-0.3 % Soln  Generic drug:  Polyethyl Glycol-Propyl Glycol  Apply 1 drop to eye daily. Administer one drop in to each eye once daily for eye irritation.     torsemide 20 MG tablet  Commonly known as:  DEMADEX  Take 20 mg by mouth 2 (two) times daily. For congestive heart failure       of note her Zoloft has been increased to 125 mg daily by psychiatric services    Immunization History  Administered Date(s) Administered  . Influenza Whole 01/22/2013  . Influenza-Unspecified 01/26/2014  . PPD Test 11/20/2011    Social History  Substance Use Topics  . Smoking status: Never Smoker   . Smokeless tobacco: Never Used  . Alcohol Use: No    Family history is noncontributory    Review of Systems  DATA OBTAINED: from patient, nurse GENERAL:  no fevers, fatigue, appetite changes SKIN: No itching, rash or wounds EYES: No eye pain, redness, discharge EARS: No earache, tinnitus, change in hearing NOSE: Does not complain of any increased congestion or drainage MOUTH/THROAT: No mouth or tooth pain, No sore throat RESPIRATORY:  Not complaining of shortness of breath or cough today CARDIAC: No chest pain, palpitations,has chronic lower extremity edema --frequent syncopal presyncopal episodes GI: Did have an episode vomiting earlier today-complains of somewhat of a sore stomach but no acute abdominal discomfort currently no nausea GU: Not complain of dysuria currently  MUSCULOSKELETAL: No unrelieved bone/joint pain does complain of chronic shoulder discomfort more so of her left  NEUROLOGIC: No headache, r focal weakness PSYCHIATRIC: No overt anxiety or sadness, No behavior  issue.  Filed Vitals:   08/09/15 2033  BP: 110/80  Pulse: 80  Temp: 97.2 F (36.2 C)  Resp: 18    Physical Exam  GENERAL APPEARANCE: Alert, conversant,  No acute distress.-Lying in bed comfortablyAppears weak  SKIN: No diaphoresis rash HEAD: Normocephalic, atraumatic  EYES: Conjunctiva/lids clear. Pupils round, reactive. EOMs intact. On left-has a history of right eye blindness  EARS: External exam WNL, canals clear. Hearing grossly normal. NOSE: No deformity or discharge.  MOUTH/THROAT--oropharynx clear mucous membranes moist RESPIRATORY: Breathing is even, unlabored.no overt congestion CARDIOVASCULAR: Heart RRR with occasional irregular beats no murmurs, rubs or gallops.   1 plus peripheral edema.--This appears stable --she does have her legs elevated in bed which I suspect help-  GASTROINTESTINAL: Abdomen is soft, some mild left-sided tenderness this does not appear to be acute no rebound tenderness r, not distended w/ normal bowel sounds. GU-could not really appreciate CV tenderness--or suprapubic tenderness MUSCULOSKELETAL: No abnormal joints or musculature--does have lower extremity weakness but this has not really changed from previous exams NEUROLOGIC:  Cranial nerves 2-12 grossly intact is baseline lower extremity weakness PSYCHIATRIC: Mood and affect appropriate to situation, no behavioral issues-pleasant and appropriate  Patient Active Problem List   Diagnosis Date Noted  . CKD (chronic kidney disease) stage 3, GFR 30-59 ml/min 07/24/2015  . Hot flashes 06/17/2015  . Cough 05/24/2015  . Syncope 11/30/2014  . FTT (failure to thrive) in adult 09/29/2014  . Palliative care encounter   . Shortness of breath   . Symptomatic bradycardia 09/24/2014  . Acute renal failure superimposed on stage 4 chronic kidney disease (HCC) 01/09/2014  . Edema 09/03/2013  . Anemia 09/03/2013  . Depression 08/13/2013  . Vitamin D deficiency 08/13/2013  . Paroxysmal atrial fibrillation  (HCC) 10/16/2012  . Essential hypertension, benign 10/16/2012  . Chronic combined systolic and diastolic CHF (congestive heart failure) (HCC) 10/16/2012  . Unspecified constipation 10/16/2012  . Osteoarthritis 10/16/2012  . Other malaise and fatigue 10/16/2012  . Incomplete tear of rotator cuff 09/30/2012       Labs  07/29/2015.  WBC 6.5 hemoglobin 9.9 platelets  427.  Sodium 140 potassium 3.9 BUN 32 creatinine 1.68.  Albumin 3.2 otherwise liver function tests within normal limits.  The NP 782.6.    05/25/2015.  Sodium 142 potassium 3.5 BUN 29 creatinine 1.6.  B13.3 otherwise liver function tests within normal limits.  WBC 8.6 hemoglobin 10.7 platelets 280 03/29/2016.  Sodium 137 potassium 3.9 BUN 25 creatinine 1.5.  WBC 10.5 hemoglobin 10.3 platelets 353  02/10/2015.  Sodium 138 potassium 3.5 BUN 28 creatinine 1.4.  WBC 9.2 hemoglobin 11.7 platelets 295.  Marland Kitchen  12/01/2014.  Sodium 139 potassium 3.7 BUN 21 creatinine 1.4-.  Liver function tests within normal limits except albumin of 3.4.  WBC 8.8 hemoglobin 11.3 platelets 297.  TSH-1.81   CBC    Component Value Date/Time   WBC 10.6* 09/23/2014 2321   WBC 9.8 07/03/2014   RBC 4.07 09/23/2014 2321   HGB 12.6 09/23/2014 2346   HCT 37.0 09/23/2014 2346   PLT 282 09/23/2014 2321   MCV 88.9 09/23/2014 2321   LYMPHSABS 1.8 09/23/2014 2321   MONOABS 0.6 09/23/2014 2321   EOSABS 0.0 09/23/2014 2321   BASOSABS 0.0 09/23/2014 2321    CMP     Component Value Date/Time   NA 138 09/25/2014 0323   NA 140 07/03/2014   K 5.1 09/25/2014 0323   CL 102 09/25/2014 0323   CO2 28 09/25/2014 0323   GLUCOSE 87 09/25/2014 0323   BUN 37* 09/25/2014 0323   BUN 21 07/03/2014   CREATININE 2.37* 09/25/2014 0323   CREATININE 1.6* 07/03/2014   CALCIUM 9.4 09/25/2014 0323   PROT 7.1 09/24/2014 0550   ALBUMIN 3.3* 09/24/2014 0550   AST 26 09/24/2014 0550   ALT 11* 09/24/2014 0550   ALKPHOS 95 09/24/2014 0550    BILITOT 1.2 09/24/2014 0550   GFRNONAA 17* 09/25/2014 0323   GFRAA 20* 09/25/2014 0323    Assessment and Plan  Bradycardia-again this was worked up in the hospital as noted above amiodarone and digoxin and beta blockers were discontinued-again ICD /pacemaker placement deferred secondary to her comorbidities advanced age and patient's wishes this has been stable pulses recently 70-80's  this is what I got on exam today  Continues to have presyncopal and syncopal episodes as noted above the patient desires to continue working with restorative ambulation  to-my knowledge There hasn't been syncopal episode in several weeks.  Chronic combined systolic and diastolic CHF-t She continues on Demadex 20 mg BID-i--she is on low dose ACE inhibitor as well-- not a good candidate for beta blocker again secondary to her bradycardia issues.- At times she will increase her Demadex slightly and appears this may be warranted again with some possible weight gain BNP is up a bit at 782.6-will increase Demadex to 40 mg twice a day for 2 days and potassium to 60 mEq total for 2 days-and then reduced previous dose of Demadex 20 mg twice a day and potassium 40 mEq a day-will update a BMP later this week  Continue to monitor weights  #3 acute renal failure superimposed on stage IV chronic kidney disease-creatinine of 1.68 on lab done April 20 11/10/2015 appears relatively baseline again we will update this later this week  #4-history of atrial fibrillation she is on Xarelto prophylaxis no rate control needed as noted above this has been stable.  #5 depression she is on Zoloft-this has been increased by psychiatric services.  Anemia she is on iron hemoglobin of 9.9 appears to be relatively baseline at this point will monitor   #  7 chronic pain she is on oxycodone 5 mg every 4 hours when necessary pain she is not complaining of discomfort today suspect she does have some discomfort when ambulating but she does not  complaining of acute changes here She also has a lidocaine patch for left shoulder pain which is chronic.  #8hypertension-appears stable--blood  pressure today 110/80 she is on low dose lisinopril again her beta blocker has been discontinued nonetheless blood pressures appear to be stable.  Number  9 a history of dizziness she has been evaluated by cardiology she is on Antivert as needed--she is not complaining of dizziness today I have talked to her about asking for Antivert when needed   #10- Hot and cold spells-apparently this is infrequent and intermittent this point monitor previous lab work has been unremarkable  #11 vomiting episode earlier today I do not see any acute process at this time but this will have to be monitored will obtain an abdominal x-ray to rule out any possible ileus obstruction although I think the likelihood of this is low-also monitor closely vital signs pulse ox every shift for 48 hours to keep an eye on her situation.    Dineen Kid note greater than 35 minutes spent assessing patient-discussing her concerns at bedside-reviewing her chart And labs t-and coordinating and formulating a plan of care for numerous diagnoses-of note greater than 50% of time spent coordinating plan of care Edmon Crape,

## 2015-08-13 LAB — BASIC METABOLIC PANEL
BUN: 31 mg/dL — AB (ref 4–21)
Creatinine: 1.6 mg/dL — AB (ref 0.5–1.1)
GLUCOSE: 92 mg/dL
POTASSIUM: 3.7 mmol/L (ref 3.4–5.3)
Sodium: 141 mmol/L (ref 137–147)

## 2015-09-01 ENCOUNTER — Encounter: Payer: Self-pay | Admitting: Internal Medicine

## 2015-09-01 ENCOUNTER — Non-Acute Institutional Stay (SKILLED_NURSING_FACILITY): Payer: Medicare Other | Admitting: Internal Medicine

## 2015-09-01 DIAGNOSIS — I420 Dilated cardiomyopathy: Secondary | ICD-10-CM | POA: Diagnosis not present

## 2015-09-01 NOTE — Progress Notes (Signed)
MRN: [id number] Name: Kara Wilkerson  Sex: female Age: 82 y.o. DOB: December 20, 1922  [facility] #: [facility] Facility/Room: 311 W Level Of Care: SNF Provider: Randon Goldsmith. Lyn Hollingshead, MD Emergency Contacts: Extended Emergency Contact Information Primary Emergency Contact: Wilkerson,Kara Address: [address] [place], [place] [address] Wilkerson Kara of [place] Home Phone: [phone] Relation: Niece Secondary Emergency Contact: [facility] Address: [address] [place], [place] [address] Wilkerson Kara of [place] Phone: [phone] Relation: Other  Code Status: DNR  Allergies: Codeine  Chief Complaint  Patient presents with  . Medical Management of Chronic Issues    Routine Visit    HPI: Patient is 82 y.o. female with endstage cardiomyopathy who is being seen for a change in condition. About a month ago restorative asked me about O2 for pt while walking; pt loves to walk with Kara Wilkerson and we had decided to keep Kara Wilkerson moving for as long as possible. We worked this out but this week Kara Wilkerson, [place] and nursing have told me about Kara Wilkerson needing more help with ADL's than usual and being 2 person assist. Pt tells me that her L leg isn't working-its always been weaker but now it won't move at all.   Past Medical History  Diagnosis Date  . CHF (congestive heart failure) (HCC)   . A-fib (HCC)   . Hypertension   . Constipation   . Arthritis   . Chronic systolic heart failure (HCC)   . Dependence on supplemental oxygen   . Other chronic pain   . Contracture of ankle and foot joint   . Muscle weakness (generalized)   . Lack of coordination   . Unspecified essential hypertension   . Edema   . GERD (gastroesophageal reflux disease)   . Hyperlipidemia     Past Surgical History  Procedure Laterality Date  . Right ankle orif    . Fracture surgery        Medication List       This list is accurate as of: 09/01/15 11:59 PM.  Always use  your most recent med list.               acetaminophen 500 MG tablet  Commonly known as:  TYLENOL  Take 1,000 mg by mouth 2 (two) times daily.     calcium-vitamin D 250-125 MG-UNIT tablet  Commonly known as:  OSCAL WITH D  Take 1 tablet by mouth 3 (three) times daily.     diclofenac sodium 1 % Gel  Commonly known as:  VOLTAREN  Apply 4 g topically 2 (two) times daily. To both knees     feeding supplement (ENSURE ENLIVE) Liqd  Take 237 mLs by mouth daily.     ferrous sulfate 325 (65 FE) MG tablet  Take 325 mg by mouth daily with breakfast.     fluticasone 50 MCG/ACT nasal spray  Commonly known as:  FLONASE  Place 2 sprays into the nose daily. For allergies     glucosamine-chondroitin 500-400 MG tablet  Take 2 tablets by mouth 2 (two) times daily. For osteoarthritis     guaiFENesin 100 MG/5ML Soln  Commonly known as:  ROBITUSSIN  Take 10 mLs by mouth every 6 (six) hours as needed for cough or to loosen phlegm.     lidocaine 5 %  Commonly known as:  LIDODERM  Place 1 patch onto the skin daily. Remove & Discard patch  within 12 hours to left shoulder     lisinopril 2.5 MG tablet  Commonly known as:  PRINIVIL,ZESTRIL  Take 2.5 mg by mouth daily. Hold for SBP less than 100.     magnesium hydroxide 400 MG/5ML suspension  Commonly known as:  MILK OF MAGNESIA  Take 60 mLs by mouth daily as needed for mild constipation or moderate constipation.     meclizine 12.5 MG tablet  Commonly known as:  ANTIVERT  Take 12.5 mg by mouth 2 (two) times daily as needed for dizziness. For dizziness     oxyCODONE 5 MG immediate release tablet  Commonly known as:  Oxy IR/ROXICODONE  Take one tablet by mouth every four hours as needed for pain     polyethylene glycol packet  Commonly known as:  MIRALAX / GLYCOLAX  Take 17 g by mouth daily. For constipation     potassium chloride SA 20 MEQ tablet  Commonly known as:  K-DUR,KLOR-CON  Take 2 tablets (40 mEq total) by mouth once.      Rivaroxaban 15 MG Tabs tablet  Commonly known as:  XARELTO  Take 15 mg by mouth daily with supper. Give 1 tablet by mouth daily after evening meal for A-Fib.     sennosides-docusate sodium 8.6-50 MG tablet  Commonly known as:  SENOKOT-S  Take 2 tablets by mouth at bedtime.     sertraline 100 MG tablet  Commonly known as:  ZOLOFT  Give 1 tablet along with Zoloft 50 mg to equal 150 mg daily by mouth for depression and axiety     sertraline 50 MG tablet  Commonly known as:  ZOLOFT  Give 1 tab along with Zoloft 100 mg to equal 150 mg daily by mouth for depression and anxiety     SYSTANE 0.4-0.3 % Soln  Generic drug:  Polyethyl Glycol-Propyl Glycol  Apply 1 drop to eye daily. Administer one drop in to each eye once daily for eye irritation.     torsemide 20 MG tablet  Commonly known as:  DEMADEX  Take 20 mg by mouth 2 (two) times daily. For congestive heart failure        Meds ordered this encounter  Medications  . sertraline (ZOLOFT) 100 MG tablet    Sig: Give 1 tablet along with Zoloft 50 mg to equal 150 mg daily by mouth for depression and axiety    Immunization History  Administered Date(s) Administered  . Influenza Whole 01/22/2013  . Influenza-Unspecified 01/26/2014, 01/04/2015  . PPD Test 11/20/2011    Social History  Substance Use Topics  . Smoking status: Never Smoker   . Smokeless tobacco: Never Used  . Alcohol Use: No    Review of Systems  DATA OBTAINED: from patient, nurse, PT+ GENERAL:  no fevers, fatigue, appetite changes SKIN: No itching, rash HEENT: No complaint RESPIRATORY: No cough, wheezing, +SOB CARDIAC: No chest pain, palpitations, lower extremity edema  GI: No abdominal pain, No N/V/D or constipation, No heartburn or reflux  GU: No dysuria, frequency or urgency, or incontinence  MUSCULOSKELETAL: No unrelieved bone/joint pain NEUROLOGIC: No headache, dizziness ; decreased use L leg PSYCHIATRIC: some concern  Filed Vitals:   09/01/15 1602   BP: 125/79  Pulse: 72  Temp: 98.2 F (36.8 C)  Resp: 16    Physical Exam  GENERAL APPEARANCE: Alert, conversant, No acute distress  SKIN: No diaphoresis rash HEENT: Unremarkable RESPIRATORY: Breathing is even, unlabored. Lung sounds are clear   CARDIOVASCULAR: Heart RRR no murmurs, rubs or gallops.  No peripheral edema  GASTROINTESTINAL: Abdomen is soft, non-tender, not distended w/ normal bowel sounds.  GENITOURINARY: Bladder non tender, not distended  MUSCULOSKELETAL: No abnormal joints or musculature NEUROLOGIC: Cranial nerves 2-12 grossly intact. Moves all extremities PSYCHIATRIC: Mood and affect appropriate to situation,mild dementia no behavioral issues  Patient Active Problem List   Diagnosis Date Noted  . CKD (chronic kidney disease) stage 3, GFR 30-59 ml/min 07/24/2015  . Hot flashes 06/17/2015  . Cough 05/24/2015  . Syncope 11/30/2014  . FTT (failure to thrive) in adult 09/29/2014  . Palliative care encounter   . Shortness of breath   . Symptomatic bradycardia 09/24/2014  . Acute renal failure superimposed on stage 4 chronic kidney disease (HCC) 01/09/2014  . Edema 09/03/2013  . Anemia 09/03/2013  . Depression 08/13/2013  . Vitamin D deficiency 08/13/2013  . Paroxysmal atrial fibrillation (HCC) 10/16/2012  . Essential hypertension, benign 10/16/2012  . Chronic combined systolic and diastolic CHF (congestive heart failure) (HCC) 10/16/2012  . Unspecified constipation 10/16/2012  . Osteoarthritis 10/16/2012  . Other malaise and fatigue 10/16/2012  . Incomplete tear of rotator cuff 09/30/2012    CBC    Component Value Date/Time   WBC 6.5 08/09/2015   WBC 10.6* 09/23/2014 2321   RBC 4.07 09/23/2014 2321   HGB 9.9* 08/09/2015   HCT 31* 08/09/2015   PLT 427* 08/09/2015   MCV 88.9 09/23/2014 2321   LYMPHSABS 1.8 09/23/2014 2321   MONOABS 0.6 09/23/2014 2321   EOSABS 0.0 09/23/2014 2321   BASOSABS 0.0 09/23/2014 2321    CMP     Component Value  Date/Time   NA 141 08/13/2015   NA 138 09/25/2014 0323   K 3.7 08/13/2015   CL 102 09/25/2014 0323   CO2 28 09/25/2014 0323   GLUCOSE 87 09/25/2014 0323   BUN 31* 08/13/2015   BUN 37* 09/25/2014 0323   CREATININE 1.6* 08/13/2015   CREATININE 2.37* 09/25/2014 0323   CALCIUM 9.4 09/25/2014 0323   PROT 7.1 09/24/2014 0550   ALBUMIN 3.3* 09/24/2014 0550   AST 11* 08/09/2015   ALT 10 08/09/2015   ALKPHOS 78 08/09/2015   BILITOT 1.2 09/24/2014 0550   GFRNONAA 17* 09/25/2014 0323   GFRAA 20* 09/25/2014 0323    Assessment and Plan  ENDSTAGE DILATED CARDIOMYOPATHY - PT has been asked to work with pt and we have all, including the pt, decided to go for it. Pt's disease is progressing, which is expected. Pt's sudden decrease in use of L leg was proabably, at worse case scenario, a small stroke but pt is not really a candidate for any different  Intervention, she is already on xarelto. I spoke with Kara Yott frankly but she is a little reluctant to accept it quite yet.Will continue to monitor and support and will keep her moving as long as possible.   Time spent > 35 min;> 50% of time with patient was spent reviewing records, labs, tests and studies, counseling and developing plan of care  Randon Goldsmith. Lyn Hollingshead, MD

## 2015-09-03 DIAGNOSIS — I420 Dilated cardiomyopathy: Secondary | ICD-10-CM | POA: Insufficient documentation

## 2015-09-20 ENCOUNTER — Inpatient Hospital Stay (HOSPITAL_COMMUNITY)
Admission: EM | Admit: 2015-09-20 | Discharge: 2015-09-22 | DRG: 291 | Disposition: A | Discharge status: Skilled Nursing Facility | Payer: Medicare Other | Attending: Internal Medicine | Admitting: Internal Medicine

## 2015-09-20 ENCOUNTER — Emergency Department (HOSPITAL_COMMUNITY): Payer: Medicare Other

## 2015-09-20 ENCOUNTER — Non-Acute Institutional Stay (SKILLED_NURSING_FACILITY): Payer: Medicare Other | Admitting: Internal Medicine

## 2015-09-20 ENCOUNTER — Encounter (HOSPITAL_COMMUNITY): Payer: Self-pay | Admitting: *Deleted

## 2015-09-20 DIAGNOSIS — I5023 Acute on chronic systolic (congestive) heart failure: Secondary | ICD-10-CM

## 2015-09-20 DIAGNOSIS — I447 Left bundle-branch block, unspecified: Secondary | ICD-10-CM | POA: Diagnosis present

## 2015-09-20 DIAGNOSIS — I480 Paroxysmal atrial fibrillation: Secondary | ICD-10-CM | POA: Diagnosis present

## 2015-09-20 DIAGNOSIS — I420 Dilated cardiomyopathy: Secondary | ICD-10-CM | POA: Diagnosis present

## 2015-09-20 DIAGNOSIS — I130 Hypertensive heart and chronic kidney disease with heart failure and stage 1 through stage 4 chronic kidney disease, or unspecified chronic kidney disease: Secondary | ICD-10-CM | POA: Diagnosis not present

## 2015-09-20 DIAGNOSIS — N183 Chronic kidney disease, stage 3 unspecified: Secondary | ICD-10-CM | POA: Diagnosis present

## 2015-09-20 DIAGNOSIS — Z7951 Long term (current) use of inhaled steroids: Secondary | ICD-10-CM

## 2015-09-20 DIAGNOSIS — Z833 Family history of diabetes mellitus: Secondary | ICD-10-CM

## 2015-09-20 DIAGNOSIS — R0602 Shortness of breath: Secondary | ICD-10-CM | POA: Diagnosis not present

## 2015-09-20 DIAGNOSIS — Z66 Do not resuscitate: Secondary | ICD-10-CM | POA: Diagnosis present

## 2015-09-20 DIAGNOSIS — Z7901 Long term (current) use of anticoagulants: Secondary | ICD-10-CM

## 2015-09-20 DIAGNOSIS — N390 Urinary tract infection, site not specified: Secondary | ICD-10-CM | POA: Diagnosis present

## 2015-09-20 DIAGNOSIS — I4891 Unspecified atrial fibrillation: Secondary | ICD-10-CM | POA: Diagnosis present

## 2015-09-20 DIAGNOSIS — I110 Hypertensive heart disease with heart failure: Secondary | ICD-10-CM | POA: Diagnosis present

## 2015-09-20 DIAGNOSIS — Z8249 Family history of ischemic heart disease and other diseases of the circulatory system: Secondary | ICD-10-CM

## 2015-09-20 DIAGNOSIS — G8929 Other chronic pain: Secondary | ICD-10-CM | POA: Diagnosis present

## 2015-09-20 DIAGNOSIS — Z885 Allergy status to narcotic agent status: Secondary | ICD-10-CM

## 2015-09-20 DIAGNOSIS — I5043 Acute on chronic combined systolic (congestive) and diastolic (congestive) heart failure: Secondary | ICD-10-CM | POA: Diagnosis present

## 2015-09-20 DIAGNOSIS — K219 Gastro-esophageal reflux disease without esophagitis: Secondary | ICD-10-CM | POA: Diagnosis present

## 2015-09-20 DIAGNOSIS — Z79891 Long term (current) use of opiate analgesic: Secondary | ICD-10-CM

## 2015-09-20 DIAGNOSIS — E785 Hyperlipidemia, unspecified: Secondary | ICD-10-CM | POA: Diagnosis present

## 2015-09-20 LAB — CBC
HCT: 33.7 % — ABNORMAL LOW (ref 36.0–46.0)
HEMOGLOBIN: 10.4 g/dL — AB (ref 12.0–15.0)
MCH: 27.1 pg (ref 26.0–34.0)
MCHC: 30.9 g/dL (ref 30.0–36.0)
MCV: 87.8 fL (ref 78.0–100.0)
Platelets: 281 10*3/uL (ref 150–400)
RBC: 3.84 MIL/uL — AB (ref 3.87–5.11)
RDW: 17.4 % — ABNORMAL HIGH (ref 11.5–15.5)
WBC: 9.4 10*3/uL (ref 4.0–10.5)

## 2015-09-20 LAB — I-STAT TROPONIN, ED: TROPONIN I, POC: 0.04 ng/mL (ref 0.00–0.08)

## 2015-09-20 NOTE — ED Notes (Signed)
Pt to ED by EMS from [facility]. Pt had one episode of emesis last night; has had increased shortness of breath today with chest tightness. EMS reports diminished lung sounds in L lobe, 5 mg albuterol given without improvement. Pt had one episode of afib RVR at a rate of 165, 10mg  cardizem given with improvement

## 2015-09-20 NOTE — ED Provider Notes (Signed)
By signing my name below, I, Emmanuella Mensah, attest that this documentation has been prepared under the direction and in the presence of Kristen N Ward, DO. Electronically Signed: Angelene Giovanni, ED Scribe. 09/20/2015. 12:12 AM.   TIME SEEN: 12:00 AM   CHIEF COMPLAINT: Shortness of breath  HPI: Kara Wilkerson is a 82 y.o. female with a hx of CHF with EF 20-25%, A-fib, and HTN who presents to the Emergency Department by ambulance from [facility] complaining of gradually worsening constant shortness of breath onset last night. She reports associated chest tightness last night that is now gone and non-productive cough for 2 weeks. She explains that she has had these episodes of SOB intermittently for approx. 2 years but her symptoms have become progressively worse and constant. Worse tonight.  Pt is currently on home O2 at night to sleep. No alleviating factors noted. Pt has not tried any medications for her symptoms PTA. Was given albuterol by EMS and then 10 mg of IV Cardizem for atrial fibrillation with RVR. She denies a hx of MI or stent placed. Pt is currently on Xarelto. She denies any chills or fever. Does complain of lower extremity swelling that has been present x 2 weeks.  PCP: Dr. Jacinto Halim and her last appointment was approx. 4 months ago.   ROS: See HPI Constitutional: no fever  Eyes: no drainage  ENT: no runny nose   Cardiovascular:   chest pain  Resp: SOB  GI: no vomiting GU: no dysuria Integumentary: no rash  Allergy: no hives  Musculoskeletal: no leg swelling  Neurological: no slurred speech ROS otherwise negative  PAST MEDICAL HISTORY/PAST SURGICAL HISTORY:  Past Medical History  Diagnosis Date  . CHF (congestive heart failure) (HCC)   . A-fib (HCC)   . Hypertension   . Constipation   . Arthritis   . Chronic systolic heart failure (HCC)   . Dependence on supplemental oxygen   . Other chronic pain   . Contracture of ankle and foot joint   . Muscle weakness  (generalized)   . Lack of coordination   . Unspecified essential hypertension   . Edema   . GERD (gastroesophageal reflux disease)   . Hyperlipidemia     MEDICATIONS:  Prior to Admission medications   Medication Sig Start Date End Date Taking? Authorizing Provider  acetaminophen (TYLENOL) 500 MG tablet Take 1,000 mg by mouth 2 (two) times daily.    Historical Provider, MD  calcium-vitamin D (OSCAL WITH D) 250-125 MG-UNIT per tablet Take 1 tablet by mouth 3 (three) times daily.    Historical Provider, MD  diclofenac sodium (VOLTAREN) 1 % GEL Apply 4 g topically 2 (two) times daily. To both knees    Historical Provider, MD  feeding supplement, ENSURE ENLIVE, (ENSURE ENLIVE) LIQD Take 237 mLs by mouth daily. 09/25/14   Yates Decamp, MD  ferrous sulfate 325 (65 FE) MG tablet Take 325 mg by mouth daily with breakfast.    Historical Provider, MD  fluticasone (FLONASE) 50 MCG/ACT nasal spray Place 2 sprays into the nose daily. For allergies    Historical Provider, MD  glucosamine-chondroitin 500-400 MG tablet Take 2 tablets by mouth 2 (two) times daily. For osteoarthritis    Historical Provider, MD  guaiFENesin (ROBITUSSIN) 100 MG/5ML SOLN Take 10 mLs by mouth every 6 (six) hours as needed for cough or to loosen phlegm.    Historical Provider, MD  lidocaine (LIDODERM) 5 % Place 1 patch onto the skin daily. Remove & Discard patch  within 12 hours to left shoulder    Historical Provider, MD  lisinopril (PRINIVIL,ZESTRIL) 2.5 MG tablet Take 2.5 mg by mouth daily. Hold for SBP less than 100.    Historical Provider, MD  magnesium hydroxide (MILK OF MAGNESIA) 400 MG/5ML suspension Take 60 mLs by mouth daily as needed for mild constipation or moderate constipation.    Historical Provider, MD  meclizine (ANTIVERT) 12.5 MG tablet Take 12.5 mg by mouth 2 (two) times daily as needed for dizziness. For dizziness    Historical Provider, MD  oxyCODONE (OXY IR/ROXICODONE) 5 MG immediate release tablet Take one tablet by  mouth every four hours as needed for pain 11/17/14   Sharon Seller, NP  Polyethyl Glycol-Propyl Glycol (SYSTANE) 0.4-0.3 % SOLN Apply 1 drop to eye daily. Administer one drop in to each eye once daily for eye irritation.    Historical Provider, MD  polyethylene glycol (MIRALAX / GLYCOLAX) packet Take 17 g by mouth daily. For constipation    Historical Provider, MD  potassium chloride SA (K-DUR,KLOR-CON) 20 MEQ tablet Take 2 tablets (40 mEq total) by mouth once. 09/25/14   Yates Decamp, MD  Rivaroxaban (XARELTO) 15 MG TABS tablet Take 15 mg by mouth daily with supper. Give 1 tablet by mouth daily after evening meal for A-Fib.    Historical Provider, MD  sennosides-docusate sodium (SENOKOT-S) 8.6-50 MG tablet Take 2 tablets by mouth at bedtime.     Historical Provider, MD  sertraline (ZOLOFT) 100 MG tablet Give 1 tablet along with Zoloft 50 mg to equal 150 mg daily by mouth for depression and axiety    Historical Provider, MD  sertraline (ZOLOFT) 50 MG tablet Give 1 tab along with Zoloft 100 mg to equal 150 mg daily by mouth for depression and anxiety    Historical Provider, MD  torsemide (DEMADEX) 20 MG tablet Take 20 mg by mouth 2 (two) times daily. For congestive heart failure 12/31/12   Astrid Divine, NP    ALLERGIES:  Allergies  Allergen Reactions  . Codeine Other (See Comments)    Unk    SOCIAL HISTORY:  Social History  Substance Use Topics  . Smoking status: Never Smoker   . Smokeless tobacco: Never Used  . Alcohol Use: No    FAMILY HISTORY: Family History  Problem Relation Age of Onset  . Diabetes Mother   . Hypertension Mother   . Cancer Brother     EXAM: BP 116/63 mmHg  Pulse 90  Temp(Src) 97.8 F (36.6 C) (Oral)  Resp 20  Wt 180 lb (81.647 kg)  SpO2 99% CONSTITUTIONAL: Alert and oriented and responds appropriately to questions.  well-nourished, elderly and chronically ill-appearing.  HEAD: Normocephalic EYES: Conjunctivae clear, PERRL ENT: normal nose; no  rhinorrhea; moist mucous membranes NECK: Supple, no meningismus, no LAD  CARD: RRR; S1 and S2 appreciated; no murmurs, no clicks, no rubs, no gallops RESP: Normal chest excursion without splinting or tachypnea; Significantly diminished on left lung base; no wheezes, no rhonchi, intermittent crackles, no hypoxia or respiratory distress, speaking full sentences ABD/GI: Normal bowel sounds; non-distended; soft, non-tender, no rebound, no guarding, no peritoneal signs.  BACK:  The back appears normal and is non-tender to palpation, there is no CVA tenderness EXT: Normal ROM in all joints; non-tender to palpation; milt bilateral +1 pitting edema to the mid calf; normal capillary refill; no cyanosis, no calf tenderness or swelling    SKIN: Normal color for age and race; warm; no rash NEURO: Moves all extremities  equally, sensation to light touch intact diffusely, cranial nerves II through XII intact PSYCH: The patient's mood and manner are appropriate. Grooming and personal hygiene are appropriate.  MEDICAL DECISION MAKING: Patient here with history of CHF. Has EF of 20-25%. Appears volume overloaded on exam. Not in distress however.  EKG shows left bundle branch block which is old compared to prior. Chest x-ray shows bilateral pleural effusions, vascular congestion. BNP 2100. Troponin negative. Chest x-ray shows possible infiltrate in the left lung base although suspect this is atelectasis. She has no fever, productive cough or leukocytosis.  Will give IV Lasix, aspirin. No longer having chest tightness. Her cardiologist is Dr. Jacinto Halim.  He agrees patient needs admission. Has requested medicine admission.  ED PROGRESS: 1:00 AM  Discussed patient's case with hospitatlist, Dr. Julian Reil.  Recommend admission to telemetry, observation bed.  I will place holding orders per their request. Patient and family (if present) updated with plan. Care transferred to hospitalist service.  I reviewed all nursing notes,  vitals, pertinent old records, EKGs, labs, imaging (as available).      EKG Interpretation  Date/Time:  Monday September 20 2015 22:28:09 EDT Ventricular Rate:  88 PR Interval:  166 QRS Duration: 151 QT Interval:  406 QTC Calculation: 491 R Axis:   -88 Text Interpretation:  Sinus rhythm Atrial premature complexes Left bundle branch block artifact noted which limits evaluation Confirmed by Bebe Shaggy  MD, Dorinda Hill ([id number]) on 09/20/2015 11:36:00 PM        I personally performed the services described in this documentation, which was scribed in my presence. The recorded information has been reviewed and is accurate.   Layla Maw Ward, DO 09/21/15 0101

## 2015-09-20 NOTE — Progress Notes (Signed)
[name], female   DOB: 04-13-1922, 82 y.o.   MRN: [id number]       MRN: [id number] Name: Kara Wilkerson  Sex: female Age: 82 y.o. DOB: 04/26/1922  [facility] Facility/Room:311 Level Of Care: SNF Provider: Edmon Crape Emergency Contacts: Extended Emergency Contact Information Primary Emergency Contact: Huntely,Douschkas Address: [address] [place], [place] [address] D[place] of [place] Home Phone: [phone] Relation: Niece Secondary Emergency Contact: [facility] Address: [address] [place], [place] [address] D[place] of [place] Phone: [phone] Relation: Other  Code Status: DNR  Allergies: Codeine  Chief Complaint  Patient presents with  . Acute Visit  Secondary to shortness of breath tachycardia     HPI: Patient is 82 y.o. female who was admitted to SNF after being hospitalized for endstage dilated cardiomyopathy.--She was found to be bradycardic in the hospital with dizziness first degree AV block with ventricular rate of 30 bpm there was some thought of an ICD placement but thought not be appropriate with multiple medical comorbidities and advanced age and patient preference. Medication adjustments were made to have beta blocker and amiodarone were discontinued-she is on Demadex.  Despite her very fragile status she appears to have done well for an extended period of time-tonight she is complaining however of increased shortness of breath and she is tachycardic with a pulse of 120.  Her respiratory rate is also somewhat elevated and mildly labored breathing.  Patient has expressed desire to possibly go to the hospital-her vital signs otherwise are stable O2 saturation is in the 90s on room air  She is not really complaining of any chest pain but is concerned about her shortness of breath also states she is dizzy she does have a history of vertigo is on  Antivert             Past Medical History  Diagnosis Date  . CHF (congestive heart failure) (HCC)   . A-fib (HCC)   . Hypertension   . Constipation   . Arthritis   . Chronic systolic heart failure (HCC)   . Dependence on supplemental oxygen   . Other chronic pain   . Contracture of ankle and foot joint   . Muscle weakness (generalized)   . Lack of coordination   . Unspecified essential hypertension   . Edema   . GERD (gastroesophageal reflux disease)   . Hyperlipidemia     Past Surgical History  Procedure Laterality Date  . Right ankle orif    . Fracture surgery       Medications have been reviewed.  She is on Flonase 2 sprays daily.  Zoloft 150 mg daily.  MiraLAX daily.  Lisinopril 2.5 mg daily.  Iron 325 mg daily.  Potassium 40 mg once daily.  Xarelto 15 mg daily.  Senokot daily.  Lidocaine patch applied daily.  Demadex 20 mg twice a day.  Oxycodone 5 mg every 4 hours when necessary.  Antivert 12.5 mg twice a day when necessary dizziness.      Immunization History  Administered Date(s) Administered  . Influenza Whole 01/22/2013  . Influenza-Unspecified 01/11/2012, 01/20/2014, 01/04/2015  . PPD Test 11/20/2011    Social History  Substance Use Topics  . Smoking status: Never Smoker   . Smokeless tobacco: Never Used  . Alcohol Use: No    Family history is noncontributory    Review of Systems  DATA OBTAINED: from patient, nurse GENERAL:  no fevers,has increased fatigue,  no appetite changes SKIN: No itching, rash or wounds EYES: No eye pain, redness, discharge EARS: No earache, tinnitus, change in hearing NOSE: Does not complain of any increased congestion or drainage MOUTH/THROAT: No mouth or tooth pain, No sore throat RESPIRATORY: Is complaining of increasing shortness of breath CARDIAC: No chest pain, palpitations,has chronic lower extremity edema --frequent syncopal presyncopal episodes GI: Does not complaining of  abdominal discomfort does complain of nausea times GU: Not complain of dysuria currently  MUSCULOSKELETAL: No unrelieved bone/joint pain does complain of chronic shoulder discomfort more so of her left  NEUROLOGIC: No headache, r focal weakness PSYCHIATRIC: Has somewhat increased anxiety this evening  T-. 98.2 pulse 92 respirations 32 blood pressure 105/72 O2 saturation is in the 90s on room air  Physical Exam  GENERAL APPEARANCE: Alert, conversant, appears mildly distressed anxious feels short of breath SKIN: No diaphoresis rash HEAD: Normocephalic, atraumatic  EYES: Conjunctiva/lids clear. Pupils round, reactive. EOMs intact. On left-has a history of right eye blindness  EARS: External exam WNL, canals clear. Hearing grossly normal. NOSE: No deformity or discharge.  MOUTH/THROAT--oropharynx clear mucous membranes moist RESPIRATORY: Breathing is shallow slightly labored -- could not appreciate any overt congestion however CARDIOVASCULAR: Heart is irregular irregular rate and rhythm varying between 110-- 120 beats a minute  no murmurs, rubs or gallops.   1 plus peripheral edema.--This appears stable --she does have her legs elevated in bed which I suspect help-  GASTROINTESTINAL: Abdomen is soft, some mild generalized tenderness this does not appear to be acute no rebound tenderness r, not distended w/ normal bowel sounds. GU-could not really appreciate CV tenderness--or suprapubic tenderness MUSCULOSKELETAL: No abnormal joints or musculature--does have lower extremity weakness but this has not really changed from previous exams NEUROLOGIC:  Cranial nerves 2-12 grossly intact is baseline lower extremity weakness PSYCHIATRIC: Appears moderately anxious  Patient Active Problem List   Diagnosis Date Noted  . UTI (urinary tract infection) 09/22/2015  . Atrial fibrillation with RVR (HCC) 09/22/2015  . Acute on chronic combined systolic (congestive) and diastolic (congestive) heart failure  (HCC) 09/21/2015  . DNR (do not resuscitate)   . Congestive dilated cardiomyopathy (HCC) 09/03/2015  . CKD (chronic kidney disease) stage 3, GFR 30-59 ml/min 07/24/2015  . Hot flashes 06/17/2015  . Cough 05/24/2015  . Syncope 11/30/2014  . FTT (failure to thrive) in adult 09/29/2014  . Palliative care encounter   . Shortness of breath   . Symptomatic bradycardia 09/24/2014  . Acute renal failure superimposed on stage 4 chronic kidney disease (HCC) 01/09/2014  . Edema 09/03/2013  . Anemia 09/03/2013  . Depression 08/13/2013  . Vitamin D deficiency 08/13/2013  . Paroxysmal atrial fibrillation (HCC) 10/16/2012  . Essential hypertension, benign 10/16/2012  . Chronic combined systolic and diastolic CHF (congestive heart failure) (HCC) 10/16/2012  . Unspecified constipation 10/16/2012  . Osteoarthritis 10/16/2012  . Other malaise and fatigue 10/16/2012  . Incomplete tear of rotator cuff 09/30/2012       Labs  08/13/2015.  Sodium 141 potassium 3.7 BUN 31 creatinine 1.56.  08/06/2015.  WBC 6.5 hemoglobin 9.9 platelets 427.  BNP 782.6  07/29/2015.  WBC 6.5 hemoglobin 9.9 platelets 427.  Sodium 140 potassium 3.9 BUN 32 creatinine 1.68.  Albumin 3.2 otherwise liver function tests within normal limits.  The NP 782.6.    05/25/2015.  Sodium 142 potassium 3.5 BUN 29 creatinine 1.6.  B13.3 otherwise liver function tests within normal limits.  WBC 8.6 hemoglobin 10.7 platelets 280 03/29/2016.  Sodium 137 potassium 3.9 BUN 25 creatinine 1.5.  WBC 10.5 hemoglobin 10.3 platelets 353  02/10/2015.  Sodium 138 potassium 3.5 BUN 28 creatinine 1.4.  WBC 9.2 hemoglobin 11.7 platelets 295.  Marland Kitchen  12/01/2014.  Sodium 139 potassium 3.7 BUN 21 creatinine 1.4-.  Liver function tests within normal limits except albumin of 3.4.  WBC 8.8 hemoglobin 11.3 platelets 297.  TSH-1.81   CBC    Component Value Date/Time   WBC 7.9 09/22/2015 0303   WBC 6.5 08/09/2015   RBC  3.70* 09/22/2015 0303   HGB 10.0* 09/22/2015 0303   HCT 32.4* 09/22/2015 0303   PLT 260 09/22/2015 0303   MCV 87.6 09/22/2015 0303   LYMPHSABS 1.8 09/23/2014 2321   MONOABS 0.6 09/23/2014 2321   EOSABS 0.0 09/23/2014 2321   BASOSABS 0.0 09/23/2014 2321    CMP     Component Value Date/Time   NA 138 09/22/2015 0303   NA 141 08/13/2015   K 3.8 09/22/2015 0303   CL 103 09/22/2015 0303   CO2 27 09/22/2015 0303   GLUCOSE 98 09/22/2015 0303   BUN 25* 09/22/2015 0303   BUN 31* 08/13/2015   CREATININE 1.75* 09/22/2015 0303   CREATININE 1.6* 08/13/2015   CALCIUM 9.5 09/22/2015 0303   PROT 7.1 09/24/2014 0550   ALBUMIN 3.3* 09/24/2014 0550   AST 11* 08/09/2015   ALT 10 08/09/2015   ALKPHOS 78 08/09/2015   BILITOT 1.2 09/24/2014 0550   GFRNONAA 24* 09/22/2015 0303   GFRAA 28* 09/22/2015 0303    Assessment and Plan  Shortness of breath and tachycardia-patient does have a history of combined systolic and diastolic CHF with end-stage cardiomyopathy continues on Demadex as well as a low-dose ACE inhibitor-patient is complaining of increased shortness of breath tonight she is tachycardic this is complicated with a history of atrial fibrillation as well.  Patient has expressed desire to go to hospital if there is anything more that can be done-at this point will send her to the ER per patient request iwithsome concern about her shortness of breath and tachycardia she does not appear to be in acute distress and is anxious-we are at the point I suspect hospice and palliative care would be a viable option but at this point patient prefers more aggressive treatment and will  her send again to the ER for expedient evaluation  Patient remained clinically stable before EMS arrived per serial exams-in factheart rate did come down somewhat to the low 100s   [id number]     .   Edmon Crape,

## 2015-09-21 DIAGNOSIS — Z7901 Long term (current) use of anticoagulants: Secondary | ICD-10-CM | POA: Diagnosis not present

## 2015-09-21 DIAGNOSIS — I4891 Unspecified atrial fibrillation: Secondary | ICD-10-CM | POA: Diagnosis not present

## 2015-09-21 DIAGNOSIS — Z66 Do not resuscitate: Secondary | ICD-10-CM

## 2015-09-21 DIAGNOSIS — I5043 Acute on chronic combined systolic (congestive) and diastolic (congestive) heart failure: Secondary | ICD-10-CM | POA: Diagnosis present

## 2015-09-21 DIAGNOSIS — Z515 Encounter for palliative care: Secondary | ICD-10-CM

## 2015-09-21 DIAGNOSIS — Z79891 Long term (current) use of opiate analgesic: Secondary | ICD-10-CM | POA: Diagnosis not present

## 2015-09-21 DIAGNOSIS — N3000 Acute cystitis without hematuria: Secondary | ICD-10-CM | POA: Diagnosis not present

## 2015-09-21 DIAGNOSIS — G8929 Other chronic pain: Secondary | ICD-10-CM | POA: Diagnosis present

## 2015-09-21 DIAGNOSIS — N183 Chronic kidney disease, stage 3 (moderate): Secondary | ICD-10-CM

## 2015-09-21 DIAGNOSIS — N390 Urinary tract infection, site not specified: Secondary | ICD-10-CM | POA: Diagnosis present

## 2015-09-21 DIAGNOSIS — E785 Hyperlipidemia, unspecified: Secondary | ICD-10-CM | POA: Diagnosis present

## 2015-09-21 DIAGNOSIS — I130 Hypertensive heart and chronic kidney disease with heart failure and stage 1 through stage 4 chronic kidney disease, or unspecified chronic kidney disease: Secondary | ICD-10-CM | POA: Diagnosis present

## 2015-09-21 DIAGNOSIS — H8113 Benign paroxysmal vertigo, bilateral: Secondary | ICD-10-CM | POA: Diagnosis not present

## 2015-09-21 DIAGNOSIS — I447 Left bundle-branch block, unspecified: Secondary | ICD-10-CM | POA: Diagnosis present

## 2015-09-21 DIAGNOSIS — Z833 Family history of diabetes mellitus: Secondary | ICD-10-CM | POA: Diagnosis not present

## 2015-09-21 DIAGNOSIS — I480 Paroxysmal atrial fibrillation: Secondary | ICD-10-CM | POA: Diagnosis present

## 2015-09-21 DIAGNOSIS — D649 Anemia, unspecified: Secondary | ICD-10-CM | POA: Diagnosis not present

## 2015-09-21 DIAGNOSIS — I10 Essential (primary) hypertension: Secondary | ICD-10-CM

## 2015-09-21 DIAGNOSIS — I420 Dilated cardiomyopathy: Secondary | ICD-10-CM | POA: Diagnosis present

## 2015-09-21 DIAGNOSIS — Z885 Allergy status to narcotic agent status: Secondary | ICD-10-CM | POA: Diagnosis not present

## 2015-09-21 DIAGNOSIS — Z8249 Family history of ischemic heart disease and other diseases of the circulatory system: Secondary | ICD-10-CM | POA: Diagnosis not present

## 2015-09-21 DIAGNOSIS — R0602 Shortness of breath: Secondary | ICD-10-CM | POA: Diagnosis present

## 2015-09-21 DIAGNOSIS — K219 Gastro-esophageal reflux disease without esophagitis: Secondary | ICD-10-CM | POA: Diagnosis present

## 2015-09-21 DIAGNOSIS — Z7951 Long term (current) use of inhaled steroids: Secondary | ICD-10-CM | POA: Diagnosis not present

## 2015-09-21 LAB — URINALYSIS, ROUTINE W REFLEX MICROSCOPIC
BILIRUBIN URINE: NEGATIVE
Glucose, UA: NEGATIVE mg/dL
KETONES UR: NEGATIVE mg/dL
NITRITE: POSITIVE — AB
Protein, ur: NEGATIVE mg/dL
SPECIFIC GRAVITY, URINE: 1.01 (ref 1.005–1.030)
pH: 6.5 (ref 5.0–8.0)

## 2015-09-21 LAB — BASIC METABOLIC PANEL
ANION GAP: 8 (ref 5–15)
BUN: 26 mg/dL — ABNORMAL HIGH (ref 6–20)
CALCIUM: 10 mg/dL (ref 8.9–10.3)
CO2: 24 mmol/L (ref 22–32)
Chloride: 105 mmol/L (ref 101–111)
Creatinine, Ser: 1.82 mg/dL — ABNORMAL HIGH (ref 0.44–1.00)
GFR, EST AFRICAN AMERICAN: 27 mL/min — AB (ref >60)
GFR, EST NON AFRICAN AMERICAN: 23 mL/min — AB (ref >60)
Glucose, Bld: 126 mg/dL — ABNORMAL HIGH (ref 65–99)
POTASSIUM: 3.9 mmol/L (ref 3.5–5.1)
SODIUM: 137 mmol/L (ref 135–145)

## 2015-09-21 LAB — MRSA PCR SCREENING: MRSA by PCR: NEGATIVE

## 2015-09-21 LAB — BRAIN NATRIURETIC PEPTIDE: B NATRIURETIC PEPTIDE 5: 2163.4 pg/mL — AB (ref 0.0–100.0)

## 2015-09-21 LAB — URINE MICROSCOPIC-ADD ON

## 2015-09-21 MED ORDER — POLYETHYLENE GLYCOL 3350 17 G PO PACK
17.0000 g | PACK | Freq: Every day | ORAL | Status: DC
  Administered 2015-09-21 – 2015-09-22 (×2): 17 g via ORAL
  Filled 2015-09-21 (×3): qty 1

## 2015-09-21 MED ORDER — MAGNESIUM HYDROXIDE 400 MG/5ML PO SUSP: 30.0000 mL | Freq: Every day | ORAL | Status: DC | PRN

## 2015-09-21 MED ORDER — SERTRALINE HCL 50 MG PO TABS
150.0000 mg | ORAL_TABLET | Freq: Every day | ORAL | Status: DC
  Administered 2015-09-21 – 2015-09-22 (×2): 150 mg via ORAL
  Filled 2015-09-21 (×2): qty 3

## 2015-09-21 MED ORDER — HYPROMELLOSE (GONIOSCOPIC) 2.5 % OP SOLN
1.0000 [drp] | Freq: Every day | OPHTHALMIC | Status: DC
  Administered 2015-09-21: 1 [drp] via OPHTHALMIC
  Filled 2015-09-21: qty 15

## 2015-09-21 MED ORDER — FERROUS SULFATE 325 (65 FE) MG PO TABS
325.0000 mg | ORAL_TABLET | Freq: Every day | ORAL | Status: DC
  Administered 2015-09-21 – 2015-09-22 (×2): 325 mg via ORAL
  Filled 2015-09-21 (×2): qty 1

## 2015-09-21 MED ORDER — FUROSEMIDE 10 MG/ML IJ SOLN
40.0000 mg | Freq: Two times a day (BID) | INTRAMUSCULAR | Status: DC
  Administered 2015-09-21 – 2015-09-22 (×3): 40 mg via INTRAVENOUS
  Filled 2015-09-21 (×3): qty 4

## 2015-09-21 MED ORDER — MECLIZINE HCL 25 MG PO TABS
12.5000 mg | ORAL_TABLET | Freq: Two times a day (BID) | ORAL | Status: DC | PRN
  Administered 2015-09-21 – 2015-09-22 (×3): 12.5 mg via ORAL
  Filled 2015-09-21 (×3): qty 1

## 2015-09-21 MED ORDER — DEXTROSE 5 % IV SOLN
1.0000 g | INTRAVENOUS | Status: DC
  Administered 2015-09-21 – 2015-09-22 (×2): 1 g via INTRAVENOUS
  Filled 2015-09-21 (×2): qty 10

## 2015-09-21 MED ORDER — DICLOFENAC SODIUM 1 % TD GEL
4.0000 g | Freq: Two times a day (BID) | TRANSDERMAL | Status: DC
  Administered 2015-09-22: 4 g via TOPICAL
  Filled 2015-09-21: qty 100

## 2015-09-21 MED ORDER — LISINOPRIL 2.5 MG PO TABS
2.5000 mg | ORAL_TABLET | Freq: Every day | ORAL | Status: DC
  Filled 2015-09-21 (×2): qty 1

## 2015-09-21 MED ORDER — RIVAROXABAN 15 MG PO TABS
15.0000 mg | ORAL_TABLET | Freq: Every day | ORAL | Status: DC
  Administered 2015-09-21: 15 mg via ORAL
  Filled 2015-09-21: qty 1

## 2015-09-21 MED ORDER — FUROSEMIDE 10 MG/ML IJ SOLN
40.0000 mg | Freq: Once | INTRAMUSCULAR | Status: AC
  Administered 2015-09-21: 40 mg via INTRAVENOUS
  Filled 2015-09-21: qty 4

## 2015-09-21 MED ORDER — LIDOCAINE 5 % EX PTCH
1.0000 | MEDICATED_PATCH | CUTANEOUS | Status: DC
  Administered 2015-09-21 – 2015-09-22 (×2): 1 via TRANSDERMAL
  Filled 2015-09-21 (×2): qty 1

## 2015-09-21 MED ORDER — FLUTICASONE PROPIONATE 50 MCG/ACT NA SUSP
2.0000 | Freq: Every day | NASAL | Status: DC
  Filled 2015-09-21: qty 16

## 2015-09-21 MED ORDER — BISACODYL 10 MG RE SUPP
10.0000 mg | Freq: Every day | RECTAL | Status: DC | PRN
Start: 2015-09-21 — End: 2015-09-22

## 2015-09-21 MED ORDER — GUAIFENESIN 100 MG/5ML PO SOLN: 10.0000 mL | Freq: Four times a day (QID) | ORAL | Status: DC | PRN

## 2015-09-21 MED ORDER — SODIUM CHLORIDE 0.9% FLUSH: 3.0000 mL | INTRAVENOUS | Status: DC | PRN

## 2015-09-21 MED ORDER — SENNOSIDES-DOCUSATE SODIUM 8.6-50 MG PO TABS
2.0000 | ORAL_TABLET | Freq: Every day | ORAL | Status: DC
  Administered 2015-09-21: 2 via ORAL
  Filled 2015-09-21: qty 2

## 2015-09-21 MED ORDER — SODIUM CHLORIDE 0.9% FLUSH
3.0000 mL | Freq: Two times a day (BID) | INTRAVENOUS | Status: DC
  Administered 2015-09-21: 3 mL via INTRAVENOUS

## 2015-09-21 MED ORDER — AMIODARONE HCL 200 MG PO TABS
200.0000 mg | ORAL_TABLET | Freq: Two times a day (BID) | ORAL | Status: DC
  Administered 2015-09-21: 200 mg via ORAL
  Filled 2015-09-21: qty 1

## 2015-09-21 MED ORDER — ACETAMINOPHEN 325 MG PO TABS: 650.0000 mg | ORAL_TABLET | ORAL | Status: DC | PRN

## 2015-09-21 MED ORDER — OXYCODONE HCL 5 MG PO TABS: 5.0000 mg | ORAL_TABLET | ORAL | Status: DC | PRN

## 2015-09-21 MED ORDER — ENSURE ENLIVE PO LIQD: 237.0000 mL | ORAL | Status: DC

## 2015-09-21 MED ORDER — POTASSIUM CHLORIDE CRYS ER 20 MEQ PO TBCR
40.0000 meq | EXTENDED_RELEASE_TABLET | Freq: Every day | ORAL | Status: DC
  Administered 2015-09-21 – 2015-09-22 (×2): 40 meq via ORAL
  Filled 2015-09-21 (×3): qty 2

## 2015-09-21 MED ORDER — ACETAMINOPHEN 500 MG PO TABS
1000.0000 mg | ORAL_TABLET | Freq: Two times a day (BID) | ORAL | Status: DC
Start: 2015-09-21 — End: 2015-09-22
  Administered 2015-09-21 – 2015-09-22 (×3): 1000 mg via ORAL
  Filled 2015-09-21 (×4): qty 2

## 2015-09-21 MED ORDER — ASPIRIN 81 MG PO CHEW
324.0000 mg | CHEWABLE_TABLET | Freq: Once | ORAL | Status: AC
  Administered 2015-09-21: 324 mg via ORAL
  Filled 2015-09-21: qty 4

## 2015-09-21 MED ORDER — ONDANSETRON HCL 4 MG/2ML IJ SOLN: 4.0000 mg | Freq: Four times a day (QID) | INTRAMUSCULAR | Status: DC | PRN

## 2015-09-21 MED ORDER — SODIUM CHLORIDE 0.9 % IV SOLN: 250.0000 mL | INTRAVENOUS | Status: DC | PRN

## 2015-09-21 MED ORDER — CALCIUM CARBONATE-VITAMIN D 500-200 MG-UNIT PO TABS
1.0000 | ORAL_TABLET | Freq: Two times a day (BID) | ORAL | Status: DC
  Administered 2015-09-21 – 2015-09-22 (×3): 1 via ORAL
  Filled 2015-09-21 (×3): qty 1

## 2015-09-21 NOTE — Progress Notes (Signed)
[name], female   DOB: December 26, 1922, 82 y.o.   MRN: [id number]  PROGRESS NOTE    Kara Wilkerson  BDZ:[id number] DOB: 14-Jun-1922 DOA: 09/20/2015  PCP: No primary care provider on file. - pt from [facility]   Brief Narrative:   Patient admitted after midnight, please for details refer to admission note done 09/21/2015.  82 -year-old female with past medical history significant for severe dilated cardiomyopathy baseline ejection fraction of 20% and grade 1 diastolic dysfunction on 2-D echo in June 2016, atrial fibrillation on anticoagulation with xarelto who presented to [facility] with worsening shortness of breath started the night prior to this admission associated with chest tightness and cough productive of clear sputum. No fevers or chills.  Patient was hemodynamically stable on the admission. Blood work was notable for hemoglobin of 10.4, creatinine 1.82. BNP was elevated at 2163. Chest x-ray showed bilateral pleural effusions greater on the left. In addition urinalysis showed large leukocytes and many bacteria. Patient started on Rocephin.   Assessment & Plan:   Principal Problem:   Acute on chronic combined systolic (congestive) and diastolic (congestive) heart failure (HCC) - Last 2 D ECHO in 09/2014 with EF 20% and grade 1 diastolic dysfunction - BNP on this admission 2163 - Pt on IV lasix 40 mg BID - Continue daily weight and strict intake and output - Replete electrolytes as needed - Stable respiratory status  Active Problems:   Essential hypertension, benign - Stop lisinopril since pt already on lasix and BP on soft side after getting lasix     DVT prophylaxis: on full dose anticoagulation with xarelto  Code Status: DNR/DNI Family Communication: no family at the bedside this am  Disposition Plan: SNF likely by 09/20/2015    Consultants:   Palliative care   Procedures:   None   Antimicrobials:   Rocephin 09/21/2015 --  >   Subjective: No overnight events.   Objective: Filed Vitals:   09/20/15 2330 09/21/15 0100 09/21/15 0200 09/21/15 0300  BP: 116/63 116/59 113/73 107/63  Pulse: 90 94 99 97  Temp:    97.8 F (36.6 C)  TempSrc:    Oral  Resp: 20 13 17 15   Weight:    85.1 kg (187 lb 9.8 oz)  SpO2: 99% 98% 98% 100%    Intake/Output Summary (Last 24 hours) at 09/21/15 1057 Last data filed at 09/21/15 0458  Gross per 24 hour  Intake    170 ml  Output    800 ml  Net   -630 ml   Filed Weights   09/20/15 2232 09/21/15 0300  Weight: 81.647 kg (180 lb) 85.1 kg (187 lb 9.8 oz)    Examination:  General exam: Appears calm and comfortable  Respiratory system: Clear to auscultation. Respiratory effort normal. Cardiovascular system: S1 & S2 heard, RRR.  Gastrointestinal system: Abdomen is nondistended, soft and nontender. No organomegaly or masses felt. Normal bowel sounds heard. Central nervous system: Alert and oriented. No focal neurological deficits. Extremities: (+1) LE edema, palpable pulses  Skin: No rashes, lesions or ulcers Psychiatry: Mood & affect appropriate.   Data Reviewed: I have personally reviewed following labs and imaging studies  CBC:  Recent Labs Lab 09/20/15 2325  WBC 9.4  HGB 10.4*  HCT 33.7*  MCV 87.8  PLT 281   Basic Metabolic Panel:  Recent Labs Lab 09/20/15 2325  NA 137  K 3.9  CL 105  CO2 24  GLUCOSE 126*  BUN 26*  CREATININE  1.82*  CALCIUM 10.0   GFR: Estimated Creatinine Clearance: 21.2 mL/min (by C-G formula based on Cr of 1.82). Liver Function Tests: No results for input(s): AST, ALT, ALKPHOS, BILITOT, PROT, ALBUMIN in the last 168 hours. No results for input(s): LIPASE, AMYLASE in the last 168 hours. No results for input(s): AMMONIA in the last 168 hours. Coagulation Profile: No results for input(s): INR, PROTIME in the last 168 hours. Cardiac Enzymes: No results for input(s): CKTOTAL, CKMB, CKMBINDEX, TROPONINI in the last 168  hours. BNP (last 3 results) No results for input(s): PROBNP in the last 8760 hours. HbA1C: No results for input(s): HGBA1C in the last 72 hours. CBG: No results for input(s): GLUCAP in the last 168 hours. Lipid Profile: No results for input(s): CHOL, HDL, LDLCALC, TRIG, CHOLHDL, LDLDIRECT in the last 72 hours. Thyroid Function Tests: No results for input(s): TSH, T4TOTAL, FREET4, T3FREE, THYROIDAB in the last 72 hours. Anemia Panel: No results for input(s): VITAMINB12, FOLATE, FERRITIN, TIBC, IRON, RETICCTPCT in the last 72 hours. Urine analysis:    Component Value Date/Time   COLORURINE STRAW* 09/21/2015 0002   APPEARANCEUR CLOUDY* 09/21/2015 0002   LABSPEC 1.010 09/21/2015 0002   PHURINE 6.5 09/21/2015 0002   GLUCOSEU NEGATIVE 09/21/2015 0002   HGBUR TRACE* 09/21/2015 0002   BILIRUBINUR NEGATIVE 09/21/2015 0002   KETONESUR NEGATIVE 09/21/2015 0002   PROTEINUR NEGATIVE 09/21/2015 0002   NITRITE POSITIVE* 09/21/2015 0002   LEUKOCYTESUR LARGE* 09/21/2015 0002   Sepsis Labs: (procalcitonin:4,lacticidven:4)   Recent Results (from the past 240 hour(s))  MRSA PCR Screening     Status: None   Collection Time: 09/21/15  3:19 AM  Result Value Ref Range Status   MRSA by PCR NEGATIVE NEGATIVE Final      Radiology Studies: Dg Chest 2 View 09/20/2015  Developing bilateral pleural effusions, greater on the left, with infiltration or atelectasis in the left lung base. Cardiac enlargement.     Scheduled Meds: . acetaminophen  1,000 mg Oral BID  . calcium-vitamin D  1 tablet Oral BID  . cefTRIAXone (ROCEPHIN)  IV  1 g Intravenous Q24H  . diclofenac sodium  4 g Topical BID  . feeding supplement (ENSURE ENLIVE)  237 mL Oral Q24H  . ferrous sulfate  325 mg Oral Q breakfast  . fluticasone  2 spray Each Nare Daily  . furosemide  40 mg Intravenous BID  . hydroxypropyl methylcellulose / hypromellose  1 drop Both Eyes Daily  . lidocaine  1 patch Transdermal Q24H  .  lisinopril  2.5 mg Oral Daily  . polyethylene glycol  17 g Oral Daily  . potassium chloride SA  40 mEq Oral Daily  . Rivaroxaban  15 mg Oral Q supper  . senna-docusate  2 tablet Oral QHS  . sertraline  150 mg Oral Daily   Continuous Infusions:       Time spent: 15 minutes  Greater than 50% of the time spent on counseling and coordinating the care.   Manson Passey, MD [facility] Pager [phone]  If 7PM-7AM, please contact night-coverage [email or web address] Password [facility] 09/21/2015, 10:57 AM

## 2015-09-21 NOTE — [place] FL2 (Signed)
[place] MEDICAID FL2 LEVEL OF CARE SCREENING TOOL     IDENTIFICATION  Patient Name: Kara Wilkerson Birthdate: 1922-05-06 Sex: female Admission Date (Current Location): 09/20/2015  [facility] and [place] Number:  Producer, television/film/video and Address:  [facility], [address] [place], [place] [address]      Provider Number: [id number]  Attending Physician Name and Address:  Alison Murray, MD  Relative Name and Phone Number:       Current Level of Care: Hospital Recommended Level of Care: Nursing Facility Prior Approval Number:    Date Approved/Denied:   PASRR Number: [id number]  Discharge Plan: Home    Current Diagnoses: Patient Active Problem List   Diagnosis Date Noted  . Acute on chronic combined systolic (congestive) and diastolic (congestive) heart failure (HCC) 09/21/2015  . Congestive dilated cardiomyopathy (HCC) 09/03/2015  . CKD (chronic kidney disease) stage 3, GFR 30-59 ml/min 07/24/2015  . Hot flashes 06/17/2015  . Cough 05/24/2015  . Syncope 11/30/2014  . FTT (failure to thrive) in adult 09/29/2014  . Palliative care encounter   . Shortness of breath   . Symptomatic bradycardia 09/24/2014  . Acute renal failure superimposed on stage 4 chronic kidney disease (HCC) 01/09/2014  . Edema 09/03/2013  . Anemia 09/03/2013  . Depression 08/13/2013  . Vitamin D deficiency 08/13/2013  . Paroxysmal atrial fibrillation (HCC) 10/16/2012  . Essential hypertension, benign 10/16/2012  . Chronic combined systolic and diastolic CHF (congestive heart failure) (HCC) 10/16/2012  . Unspecified constipation 10/16/2012  . Osteoarthritis 10/16/2012  . Other malaise and fatigue 10/16/2012  . Incomplete tear of rotator cuff 09/30/2012    Orientation RESPIRATION BLADDER Height & Weight     Self, Time, Situation, Place  Normal Continent Weight: 187 lb 9.8 oz (85.1 kg) Height:   (unk)  BEHAVIORAL SYMPTOMS/MOOD NEUROLOGICAL BOWEL NUTRITION STATUS    (None)  (none) Continent Diet (Heart)  AMBULATORY STATUS COMMUNICATION OF NEEDS Skin   Extensive Assist Verbally Normal                       Personal Care Assistance Level of Assistance  Bathing, Feeding, Dressing Bathing Assistance: Limited assistance Feeding assistance: Independent Dressing Assistance: Limited assistance     Functional Limitations Info  Sight, Hearing, Speech Sight Info: Adequate Hearing Info: Adequate Speech Info: Adequate    SPECIAL CARE FACTORS FREQUENCY  PT (By licensed PT), OT (By licensed OT)     PT Frequency: 5/ week OT Frequency: 5/ week            Contractures Contractures Info: Not present    Additional Factors Info  Code Status, Allergies (Ceftriaxone (ROCEPHIN) 1 g in dextrose 5 % 50 ml IVPB Dose: 1 g Freq: Every 24 hours Route: IV) Code Status Info: DNR Allergies Info: Codeine           Current Medications (09/21/2015):  This is the current hospital active medication list Current Facility-Administered Medications  Medication Dose Route Frequency Provider Last Rate Last Dose  . acetaminophen (TYLENOL) tablet 1,000 mg  1,000 mg Oral BID Hillary Bow, DO   1,000 mg at 09/21/15 1056  . acetaminophen (TYLENOL) tablet 650 mg  650 mg Oral Q4H PRN Hillary Bow, DO      . bisacodyl (DULCOLAX) suppository 10 mg  10 mg Rectal Daily PRN Hillary Bow, DO      . calcium-vitamin D (OSCAL WITH D) 500-200 MG-UNIT per tablet 1 tablet  1 tablet Oral BID Hillary Bow, DO   1 tablet at 09/21/15 1055  . cefTRIAXone (ROCEPHIN) 1 g in dextrose 5 % 50 mL IVPB  1 g Intravenous Q24H Hillary Bow, DO   1 g at 09/21/15 0341  . diclofenac sodium (VOLTAREN) 1 % transdermal gel 4 g  4 g Topical BID Hillary Bow, DO   4 g at 09/21/15 1000  . feeding supplement (ENSURE ENLIVE) (ENSURE ENLIVE) liquid 237 mL  237 mL Oral Q24H Hillary Bow, DO   237 mL at 09/21/15 1000  . ferrous sulfate tablet 325 mg  325 mg Oral Q breakfast Hillary Bow, DO    325 mg at 09/21/15 1055  . fluticasone (FLONASE) 50 MCG/ACT nasal spray 2 spray  2 spray Each Nare Daily Hillary Bow, DO   2 spray at 09/21/15 1000  . furosemide (LASIX) injection 40 mg  40 mg Intravenous BID Hillary Bow, DO   40 mg at 09/21/15 1056  . guaiFENesin (ROBITUSSIN) 100 MG/5ML solution 200 mg  10 mL Oral Q6H PRN Hillary Bow, DO      . hydroxypropyl methylcellulose / hypromellose (ISOPTO TEARS / GONIOVISC) 2.5 % ophthalmic solution 1 drop  1 drop Both Eyes Daily Hillary Bow, DO   1 drop at 09/21/15 1102  . lidocaine (LIDODERM) 5 % 1 patch  1 patch Transdermal Q24H Hillary Bow, DO   1 patch at 09/21/15 1056  . lisinopril (PRINIVIL,ZESTRIL) tablet 2.5 mg  2.5 mg Oral Daily Hillary Bow, DO   2.5 mg at 09/21/15 1055  . magnesium hydroxide (MILK OF MAGNESIA) suspension 30-60 mL  30-60 mL Oral Daily PRN Hillary Bow, DO      . meclizine (ANTIVERT) tablet 12.5 mg  12.5 mg Oral BID PRN Hillary Bow, DO   12.5 mg at 09/21/15 1055  . ondansetron (ZOFRAN) injection 4 mg  4 mg Intravenous Q6H PRN Hillary Bow, DO      . oxyCODONE (Oxy IR/ROXICODONE) immediate release tablet 5 mg  5 mg Oral Q4H PRN Hillary Bow, DO      . polyethylene glycol (MIRALAX / GLYCOLAX) packet 17 g  17 g Oral Daily Hillary Bow, DO   17 g at 09/21/15 1130  . potassium chloride SA (K-DUR,KLOR-CON) CR tablet 40 mEq  40 mEq Oral Daily Hillary Bow, DO   40 mEq at 09/21/15 1105  . Rivaroxaban (XARELTO) tablet 15 mg  15 mg Oral Q supper Hillary Bow, DO      . senna-docusate (Senokot-S) tablet 2 tablet  2 tablet Oral QHS Hillary Bow, DO      . sertraline (ZOLOFT) tablet 150 mg  150 mg Oral Daily Hillary Bow, DO   150 mg at 09/21/15 1055     Discharge Medications: Please see discharge summary for a list of discharge medications.  Relevant Imaging Results:  Relevant Lab Results:   Additional Information SSN:[id number]  [facility], LCSW

## 2015-09-21 NOTE — Consult Note (Signed)
CARDIOLOGY CONSULT NOTE  [name] MRN: [id number] DOB/AGE: [birth year] 82 y.o.  Admit date: 09/20/2015 Referring Physician: Internal Medicine Primary Physician:  Merrilee Seashore, MD Reason for Consultation: Acute on chronic combined systolic and diastolic heart failure  HPI: Kara Wilkerson  is a 82 y.o. female with a history of congestive cardiomyopathy, dilated cardiomyopathy with severe left ventricular systolic dysfunction, paroxysmal atrial fibrillation, and essential hypertension with CKD stage III. She was last seen on an outpatient basis on 07/19/2015.  At that time, as it had been over one year since she had had any episode of acute decompensated heart failure, and given age, multiple comorbidities, and DNR status, she was made PRN.  She is not on a beta blocker due to symptomatic bradycardia, however was on appropriate medical therapy otherwise with ACE inhibitor and a loop diuretic.  She was sent to the emergency department on 09/20/2015 from [facility] for increasing shortness of breath and chest tightness.  On arrival, she was reportedly in A. fib with RVR with rates in the 160s, rate improved with 10 mg IV Cardizem bolus. Initial EKG revealed sinus rhythm. Chest x-ray revealed bilateral pleural effusions and BNP was markedly elevated at 2100.  Past Medical History  Diagnosis Date  . CHF (congestive heart failure) (HCC)   . A-fib (HCC)   . Hypertension   . Constipation   . Arthritis   . Chronic systolic heart failure (HCC)   . Dependence on supplemental oxygen   . Other chronic pain   . Contracture of ankle and foot joint   . Muscle weakness (generalized)   . Lack of coordination   . Unspecified essential hypertension   . Edema   . GERD (gastroesophageal reflux disease)   . Hyperlipidemia      Past Surgical History  Procedure Laterality Date  . Right ankle orif    . Fracture surgery       Family History  Problem Relation Age of Onset  . Diabetes  Mother   . Hypertension Mother   . Cancer Brother      Social History: Social History   Social History  . Marital Status: Widowed    Spouse Name: N/A  . Number of Children: N/A  . Years of Education: N/A   Occupational History  . Not on file.   Social History Main Topics  . Smoking status: Never Smoker   . Smokeless tobacco: Never Used  . Alcohol Use: No  . Drug Use: No  . Sexual Activity: Not on file   Other Topics Concern  . Not on file   Social History Narrative     Prescriptions prior to admission  Medication Sig Dispense Refill Last Dose  . acetaminophen (TYLENOL) 500 MG tablet Take 1,000 mg by mouth 2 (two) times daily.   unk  . bisacodyl (DULCOLAX) 10 MG suppository Place 10 mg rectally daily as needed for moderate constipation.   unk  . calcium-vitamin D (OSCAL WITH D) 500-200 MG-UNIT tablet Take 1 tablet by mouth 2 (two) times daily.   unk  . diclofenac sodium (VOLTAREN) 1 % GEL Apply 4 g topically 2 (two) times daily. To both knees   unk  . feeding supplement, ENSURE ENLIVE, (ENSURE ENLIVE) LIQD Take 237 mLs by mouth daily. 237 mL 12 unk  . ferrous sulfate 325 (65 FE) MG tablet Take 325 mg by mouth daily with breakfast.   unk  . fluticasone (FLONASE) 50 MCG/ACT nasal spray Place 2 sprays into the  nose daily. For allergies   unk  . glucosamine-chondroitin 500-400 MG tablet Take 2 tablets by mouth 2 (two) times daily. For osteoarthritis   unk  . guaiFENesin (ROBITUSSIN) 100 MG/5ML SOLN Take 10 mLs by mouth every 6 (six) hours as needed for cough or to loosen phlegm.   unk  . lidocaine (LIDODERM) 5 % Place 1 patch onto the skin daily. Remove & Discard patch within 12 hours to left shoulder   unk  . lisinopril (PRINIVIL,ZESTRIL) 2.5 MG tablet Take 2.5 mg by mouth daily. Hold for SBP less than 100.   unk  . magnesium hydroxide (MILK OF MAGNESIA) 400 MG/5ML suspension Take 30-60 mLs by mouth daily as needed for mild constipation or moderate constipation.    unk  .  meclizine (ANTIVERT) 12.5 MG tablet Take 12.5 mg by mouth 2 (two) times daily as needed for dizziness. For dizziness   unk  . oxyCODONE (OXY IR/ROXICODONE) 5 MG immediate release tablet Take one tablet by mouth every four hours as needed for pain 180 tablet 0 unk  . Polyethyl Glycol-Propyl Glycol (SYSTANE) 0.4-0.3 % SOLN Apply 1 drop to eye daily. Administer one drop in to each eye once daily for eye irritation.   unk  . polyethylene glycol (MIRALAX / GLYCOLAX) packet Take 17 g by mouth daily. For constipation   unk  . potassium chloride SA (K-DUR,KLOR-CON) 20 MEQ tablet Take 2 tablets (40 mEq total) by mouth once. (Patient taking differently: Take 40 mEq by mouth daily. )   unk  . Rivaroxaban (XARELTO) 15 MG TABS tablet Take 15 mg by mouth daily with supper. Give 1 tablet by mouth daily after evening meal for A-Fib.   unk  . sennosides-docusate sodium (SENOKOT-S) 8.6-50 MG tablet Take 2 tablets by mouth at bedtime.    unk  . sertraline (ZOLOFT) 50 MG tablet Take 150 mg by mouth daily. Give 1 tab along with Zoloft 100 mg to equal 150 mg daily by mouth for depression and anxiety   unk  . Sodium Phosphates (RA SALINE ENEMA RE) Place 1 Applicatorful rectally daily as needed (constipation).   unk  . torsemide (DEMADEX) 20 MG tablet Take 20 mg by mouth 2 (two) times daily. For congestive heart failure   unk     ROS: General: no fevers/chills/night sweats Eyes: no blurry vision, diplopia, or amaurosis ENT: no sore throat or hearing loss Resp: Reports SOB; no cough, wheezing, or hemoptysis CV: Reports edema; occasional chest pain. No palpitations GI: no abdominal pain, nausea, vomiting, diarrhea, or constipation GU: no dysuria, frequency, or hematuria Skin: no rash Neuro: no headache, numbness, tingling, or weakness of extremities Musculoskeletal: no joint pain or swelling Heme: no bleeding, DVT, or easy bruising Endo: no polydipsia or polyuria    Physical Exam: Blood pressure 102/70, pulse 95,  temperature 97.8 F (36.6 C), temperature source Oral, resp. rate 18, weight 85.1 kg (187 lb 9.8 oz), SpO2 100 %.   General appearance: alert, cooperative, appears stated age and no distress Lungs: faint bilateral basal crackles heard, faint expiratory wheeze bilaterally. Heart: Distant heart sounds. Abdomen: soft, non-tender; bowel sounds normal; no masses,  no organomegaly and obese. Extremities: edema trace bilateral lower extremity pitting edema.  Full range of motion movements. Neurologic: Grossly normal  Labs:   Lab Results  Component Value Date   WBC 9.4 09/20/2015   HGB 10.4* 09/20/2015   HCT 33.7* 09/20/2015   MCV 87.8 09/20/2015   PLT 281 09/20/2015    Recent Labs  Lab 09/20/15 2325  NA 137  K 3.9  CL 105  CO2 24  BUN 26*  CREATININE 1.82*  CALCIUM 10.0  GLUCOSE 126*    Recent Labs  09/23/14 2309 09/24/14 0819 09/20/15 2336  BNP 124.4* 130.4* 2163.4*   Recent Labs  09/24/14 0819  TSH 1.135    EKG 09/21/2015: Sinus rhythm at a rate of 88 bpm, leftward axis, PAC.  Left bundle branch block.  No further analysis due to LBBB.  No change from outpatient EKG on 07/19/2015.  Echo 09/24/2014: - Left ventricle: The cavity size was mildly dilated. There was mild concentric hypertrophy. Systolic function was severely reduced. The estimated ejection fraction was in the range of 20-25%. Diffuse hypokinesis. Doppler parameters are consistent with abnormal left ventricular relaxation (grade 1 diastolic dysfunction). Doppler parameters are consistent with high ventricular filling pressure. - Ventricular septum: Septal motion showed moderate dyssynergy. These changes are consistent with a left bundle branch block. - Mitral valve: Calcified annulus. There was mild regurgitation. - Left atrium: The atrium was mildly dilated. - Atrial septum: No defect or patent foramen ovale was identified.   Radiology: Dg Chest 2 View  09/20/2015  CLINICAL DATA:  Shortness of breath  tonight. No chest pain. History of CHF EXAM: CHEST  2 VIEW COMPARISON:  09/23/2014 FINDINGS: Cardiac enlargement without vascular congestion. Since the previous study, there is developing bilateral pleural effusions, greater on the left, with infiltration or atelectasis in the left lung base. Changes could represent fluid overload. Superimposed pneumonia on the left is not excluded. No pneumothorax. Mediastinal contours appear intact. Degenerative changes in the spine and shoulders. IMPRESSION: Developing bilateral pleural effusions, greater on the left, with infiltration or atelectasis in the left lung base. Cardiac enlargement. Electronically Signed   By: Burman Nieves M.D.   On: 09/20/2015 23:39    Scheduled Meds: . acetaminophen  1,000 mg Oral BID  . calcium-vitamin D  1 tablet Oral BID  . cefTRIAXone (ROCEPHIN)  IV  1 g Intravenous Q24H  . diclofenac sodium  4 g Topical BID  . feeding supplement (ENSURE ENLIVE)  237 mL Oral Q24H  . ferrous sulfate  325 mg Oral Q breakfast  . fluticasone  2 spray Each Nare Daily  . furosemide  40 mg Intravenous BID  . hydroxypropyl methylcellulose / hypromellose  1 drop Both Eyes Daily  . lidocaine  1 patch Transdermal Q24H  . lisinopril  2.5 mg Oral Daily  . polyethylene glycol  17 g Oral Daily  . potassium chloride SA  40 mEq Oral Daily  . Rivaroxaban  15 mg Oral Q supper  . senna-docusate  2 tablet Oral QHS  . sertraline  150 mg Oral Daily  . sodium chloride flush  3 mL Intravenous Q12H   Continuous Infusions:  PRN Meds:.sodium chloride, acetaminophen, bisacodyl, guaiFENesin, magnesium hydroxide, meclizine, ondansetron (ZOFRAN) IV, oxyCODONE, sodium chloride flush   Intake/Output Summary (Last 24 hours) at 09/21/15 1659 Last data filed at 09/21/15 0458  Gross per 24 hour  Intake    170 ml  Output    800 ml  Net   -630 ml     ASSESSMENT AND PLAN:  1. Dilated cardiomyopathy with severe left ventricular systolic dysfunction 2. Acute on  chronic combined systolic and diastolic heart failure due to recurrent a.fib with RVR 3. Paroxysmal atrial fibrillation (CHA2DS2-VASCScore: Risk Score 5,  Yearly risk of stroke 6.7%. OAC Has Bled: Score 2.  Estimated risk of major bleeding at 1 year with [facility]  1.88-3.2%. On Xarelto.) 4. Essential hypertension  5. CKD stage III 6.  DNR status  Recommendation: Telemetry monitor reveals recurrent a.fib with RVR, likely precipitating acute on chronic HF. Will add amiodarone 200mg  bid for now for maintenance of sinus rhythm. Previously patient has not been able to tolerate beta blockers due to  Symptomatic profound bradycardia. Hence Will avoid beta blocker therapy for now.Given her advanced age and multiple medical comorbidities,recommend conservative therapy only for now.  I'll continue to follow the patient, symptomatically she is improved significantly since hospitalization on mild  Diuresis.  Yates Decamp, MD 09/21/2015, 8:32 AM [facility] Pager: [phone] Office: [phone] If no answer Cell [phone]

## 2015-09-21 NOTE — Discharge Instructions (Signed)

## 2015-09-21 NOTE — H&P (Addendum)
History and Physical    Kara Wilkerson UJW:[id number] DOB: 06/14/1922 DOA: 09/20/2015   PCP: No primary care provider on file. Chief Complaint:  Chief Complaint  Patient presents with  . Shortness of Breath    HPI: Kara Wilkerson is a 82 y.o. 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  Patient presents to the ED with gradually worsening SOB onset last night.  Associated chest tightness and productive cough for 2 weeks.  These have been off and on for the past 2 years but symptoms have recently become progressively worse and constant.  As a result she was sent in from her SNF to the ED for evaluation.  ED Course: In the ED she is found to have a BNP of 2100 (looks like it has run just over 100 on prior admits), CXR shows developing bilateral pleural effusions.  Cardiac enlargement.  Review of Systems: Positive for worsening BLE swelling for past 2 weeks.  As per HPI otherwise 10 point review of systems negative.    Past Medical History  Diagnosis Date  . CHF (congestive heart failure) (HCC)   . A-fib (HCC)   . Hypertension   . Constipation   . Arthritis   . Chronic systolic heart failure (HCC)   . Dependence on supplemental oxygen   . Other chronic pain   . Contracture of ankle and foot joint   . Muscle weakness (generalized)   . Lack of coordination   . Unspecified essential hypertension   . Edema   . GERD (gastroesophageal reflux disease)   . Hyperlipidemia     Past Surgical History  Procedure Laterality Date  . Right ankle orif    . Fracture surgery       reports that she has never smoked. She has never used smokeless tobacco. She reports that she does not drink alcohol or use illicit drugs.  Allergies  Allergen Reactions  . Codeine Other (See Comments)    Unk    Family History  Problem Relation Age of Onset  . Diabetes Mother   . Hypertension Mother    . Cancer Brother      Prior to Admission medications   Medication Sig Start Date End Date Taking? Authorizing Provider  acetaminophen (TYLENOL) 500 MG tablet Take 1,000 mg by mouth 2 (two) times daily.   Yes Historical Provider, MD  bisacodyl (DULCOLAX) 10 MG suppository Place 10 mg rectally daily as needed for moderate constipation.   Yes Historical Provider, MD  calcium-vitamin D (OSCAL WITH D) 500-200 MG-UNIT tablet Take 1 tablet by mouth 2 (two) times daily.   Yes Historical Provider, MD  diclofenac sodium (VOLTAREN) 1 % GEL Apply 4 g topically 2 (two) times daily. To both knees   Yes Historical Provider, MD  feeding supplement, ENSURE ENLIVE, (ENSURE ENLIVE) LIQD Take 237 mLs by mouth daily. 09/25/14  Yes Yates Decamp, MD  ferrous sulfate 325 (65 FE) MG tablet Take 325 mg by mouth daily with breakfast.   Yes Historical Provider, MD  fluticasone (FLONASE) 50 MCG/ACT nasal spray Place 2 sprays into the nose daily. For allergies   Yes Historical Provider, MD  glucosamine-chondroitin 500-400 MG tablet Take 2 tablets by mouth 2 (two) times daily. For osteoarthritis   Yes Historical Provider, MD  guaiFENesin (ROBITUSSIN) 100 MG/5ML SOLN Take 10 mLs by mouth every 6 (six) hours as needed for cough or to loosen phlegm.   Yes  Historical Provider, MD  lidocaine (LIDODERM) 5 % Place 1 patch onto the skin daily. Remove & Discard patch within 12 hours to left shoulder   Yes Historical Provider, MD  lisinopril (PRINIVIL,ZESTRIL) 2.5 MG tablet Take 2.5 mg by mouth daily. Hold for SBP less than 100.   Yes Historical Provider, MD  magnesium hydroxide (MILK OF MAGNESIA) 400 MG/5ML suspension Take 30-60 mLs by mouth daily as needed for mild constipation or moderate constipation.    Yes Historical Provider, MD  meclizine (ANTIVERT) 12.5 MG tablet Take 12.5 mg by mouth 2 (two) times daily as needed for dizziness. For dizziness   Yes Historical Provider, MD  oxyCODONE (OXY IR/ROXICODONE) 5 MG immediate release  tablet Take one tablet by mouth every four hours as needed for pain 11/17/14  Yes Sharon Seller, NP  Polyethyl Glycol-Propyl Glycol (SYSTANE) 0.4-0.3 % SOLN Apply 1 drop to eye daily. Administer one drop in to each eye once daily for eye irritation.   Yes Historical Provider, MD  polyethylene glycol (MIRALAX / GLYCOLAX) packet Take 17 g by mouth daily. For constipation   Yes Historical Provider, MD  potassium chloride SA (K-DUR,KLOR-CON) 20 MEQ tablet Take 2 tablets (40 mEq total) by mouth once. Patient taking differently: Take 40 mEq by mouth daily.  09/25/14  Yes Yates Decamp, MD  Rivaroxaban (XARELTO) 15 MG TABS tablet Take 15 mg by mouth daily with supper. Give 1 tablet by mouth daily after evening meal for A-Fib.   Yes Historical Provider, MD  sennosides-docusate sodium (SENOKOT-S) 8.6-50 MG tablet Take 2 tablets by mouth at bedtime.    Yes Historical Provider, MD  sertraline (ZOLOFT) 50 MG tablet Take 150 mg by mouth daily. Give 1 tab along with Zoloft 100 mg to equal 150 mg daily by mouth for depression and anxiety   Yes Historical Provider, MD  Sodium Phosphates (RA SALINE ENEMA RE) Place 1 Applicatorful rectally daily as needed (constipation).   Yes Historical Provider, MD  torsemide (DEMADEX) 20 MG tablet Take 20 mg by mouth 2 (two) times daily. For congestive heart failure 12/31/12  Yes Astrid Divine, NP    Physical Exam: Filed Vitals:   09/20/15 2232 09/20/15 2234 09/20/15 2330 09/21/15 0100  BP: 112/63  116/63 116/59  Pulse: 81  90 94  Temp: 97.8 F (36.6 C)     TempSrc: Oral     Resp: 19  20 13   Weight: 81.647 kg (180 lb)     SpO2: 97% 100% 99% 98%      Constitutional: NAD, calm, comfortable Eyes: PERRL, lids and conjunctivae normal ENMT: Mucous membranes are moist. Posterior pharynx clear of any exudate or lesions.Normal dentition.  Neck: normal, supple, no masses, no thyromegaly Respiratory: clear to auscultation bilaterally, no wheezing, no crackles. Normal respiratory  effort. No accessory muscle use.  Cardiovascular: Regular rate and rhythm, no murmurs / rubs / gallops. 1+ BLL pitting edema. 2+ pedal pulses. No carotid bruits.  Abdomen: no tenderness, no masses palpated. No hepatosplenomegaly. Bowel sounds positive.  Musculoskeletal: no clubbing / cyanosis. No joint deformity upper and lower extremities. Good ROM, no contractures. Normal muscle tone.  Skin: no rashes, lesions, ulcers. No induration Neurologic: CN 2-12 grossly intact. Sensation intact, DTR normal. Strength 5/5 in all 4.  Psychiatric: Normal judgment and insight. Alert and oriented x 3. Normal mood.    Labs on Admission: I have personally reviewed following labs and imaging studies  CBC:  Recent Labs Lab 09/20/15 2325  WBC 9.4  HGB  10.4*  HCT 33.7*  MCV 87.8  PLT 281   Basic Metabolic Panel:  Recent Labs Lab 09/20/15 2325  NA 137  K 3.9  CL 105  CO2 24  GLUCOSE 126*  BUN 26*  CREATININE 1.82*  CALCIUM 10.0   GFR: Estimated Creatinine Clearance: 20.8 mL/min (by C-G formula based on Cr of 1.82). Liver Function Tests: No results for input(s): AST, ALT, ALKPHOS, BILITOT, PROT, ALBUMIN in the last 168 hours. No results for input(s): LIPASE, AMYLASE in the last 168 hours. No results for input(s): AMMONIA in the last 168 hours. Coagulation Profile: No results for input(s): INR, PROTIME in the last 168 hours. Cardiac Enzymes: No results for input(s): CKTOTAL, CKMB, CKMBINDEX, TROPONINI in the last 168 hours. BNP (last 3 results) No results for input(s): PROBNP in the last 8760 hours. HbA1C: No results for input(s): HGBA1C in the last 72 hours. CBG: No results for input(s): GLUCAP in the last 168 hours. Lipid Profile: No results for input(s): CHOL, HDL, LDLCALC, TRIG, CHOLHDL, LDLDIRECT in the last 72 hours. Thyroid Function Tests: No results for input(s): TSH, T4TOTAL, FREET4, T3FREE, THYROIDAB in the last 72 hours. Anemia Panel: No results for input(s): VITAMINB12,  FOLATE, FERRITIN, TIBC, IRON, RETICCTPCT in the last 72 hours. Urine analysis:    Component Value Date/Time   COLORURINE STRAW* 09/21/2015 0002   APPEARANCEUR CLOUDY* 09/21/2015 0002   LABSPEC 1.010 09/21/2015 0002   PHURINE 6.5 09/21/2015 0002   GLUCOSEU NEGATIVE 09/21/2015 0002   HGBUR TRACE* 09/21/2015 0002   BILIRUBINUR NEGATIVE 09/21/2015 0002   KETONESUR NEGATIVE 09/21/2015 0002   PROTEINUR NEGATIVE 09/21/2015 0002   NITRITE POSITIVE* 09/21/2015 0002   LEUKOCYTESUR LARGE* 09/21/2015 0002   Sepsis Labs: @LABRCNTIP (procalcitonin:4,lacticidven:4) )No results found for this or any previous visit (from the past 240 hour(s)).   Radiological Exams on Admission: Dg Chest 2 View  09/20/2015  CLINICAL DATA:  Shortness of breath tonight. No chest pain. History of CHF EXAM: CHEST  2 VIEW COMPARISON:  09/23/2014 FINDINGS: Cardiac enlargement without vascular congestion. Since the previous study, there is developing bilateral pleural effusions, greater on the left, with infiltration or atelectasis in the left lung base. Changes could represent fluid overload. Superimposed pneumonia on the left is not excluded. No pneumothorax. Mediastinal contours appear intact. Degenerative changes in the spine and shoulders. IMPRESSION: Developing bilateral pleural effusions, greater on the left, with infiltration or atelectasis in the left lung base. Cardiac enlargement. Electronically Signed   By: Burman Nieves M.D.   On: 09/20/2015 23:39    EKG: Independently reviewed.  Assessment/Plan Principal Problem:   Acute on chronic combined systolic (congestive) and diastolic (congestive) heart failure (HCC) Active Problems:   Essential hypertension, benign   CKD (chronic kidney disease) stage 3, GFR 30-59 ml/min   Congestive dilated cardiomyopathy (HCC)   CHF -  Lasix 40mg  IV BID ordered, does take torsemide 20mg  PO BID at home, may need to up lasix dose depending on response to first dose which has now  been given in ED  Strict intake and output  CHF pathway   Didn't re-order 2d echo, prior echo shows EF of 20-25% with severe dilation, she also has an old LBBB on EKG seen again today.  She is DNR, Dr. Jacinto Halim to eval, but I assume that this code status and her age is the reason she is not considered a candidate for CRT + ICD.  CKD - monitor with daily BMP during diuresis  HTN - continue home meds  A.Fib -  continue Xarelto, tele monitor, was in RVR initially after albuterol treatment but hasnt been since getting IV cardizem bolus by EMS.   DVT prophylaxis: Continue home Xeralto Code Status: DNR Family Communication: No family in room Consults called: Dr. Jacinto Halim called by EDP Admission status: Admit to obs   Hillary Bow DO [facility] Pager [phone] from 7PM-7AM  If 7AM-7PM, please contact the day physician for the patient [email or web address] Password [email or web address]  09/21/2015, 1:40 AM

## 2015-09-21 NOTE — Progress Notes (Signed)
Rocephin ordered for UTI on UA.

## 2015-09-21 NOTE — Clinical Social Work Note (Signed)
Clinical Social Work Assessment  Patient Details  Name: Kara Wilkerson MRN: [id number] Date of Birth: Aug 14, 1922  Date of referral:  09/21/15               Reason for consult:  Discharge Planning                Permission sought to share information with:  Facility Sport and exercise psychologist, Family Supports Permission granted to share information::  Yes, Verbal Permission Granted  Name::     Kara Wilkerson  Agency::  Health and safety inspector   Relationship::  Niece  Contact Information:     Housing/Transportation Living arrangements for the past 2 months:  Single Family Home Source of Information:  Patient, Other (Comment Required) (Niece) Patient Interpreter Needed:  None Criminal Activity/Legal Involvement Pertinent to Current Situation/Hospitalization:  No - Comment as needed Significant Relationships:  None Lives with:    Do you feel safe going back to the place where you live?  Yes Need for family participation in patient care:  Yes (Comment)  Care giving concerns:  Patient is long term resident of [facility].    Social Worker assessment / plan:  CSW met with patient at beside to complete assessment. Patient was resting comfortably in bed. Patient reported she is from [facility]. Patient reported she has been a resident of [facility] for Four years. Patient reported her support as her niece Douschkas Wilkerson. Per patient request CSW called Douschkas Huntley.  Patient's niece confirmed that patient will return to [facility].   Employment status:  Retired Forensic scientist:  [employer] PT Recommendations:  [place] / Referral to community resources:  [place]  Patient/Family's Response to care:  The patient appears happy with the care she is receiving in hospital and is appreciative of CSW assistance.  Patient/Family's Understanding of and Emotional Response to Diagnosis, Current Treatment, and Prognosis:  The patient has a good understanding of  why she was admitted. She understands the care plan and what she will need post discharge.  Emotional Assessment Appearance:  Appears stated age Attitude/Demeanor/Rapport:   (Patient was welcoming of CSW and appropriate.) Affect (typically observed):  Calm, Appropriate, Accepting Orientation:  Oriented to Self, Oriented to Place, Oriented to  Time, Oriented to Situation Alcohol / Substance use:  Not Applicable Psych involvement (Current and /or in the community):  No (Comment)  Discharge Needs  Concerns to be addressed:  Discharge Planning Concerns Readmission within the last 30 days:  No Current discharge risk:  None Barriers to Discharge:  Continued Medical Work up   [employer], LCSW 09/21/2015, 2:58 PM

## 2015-09-21 NOTE — Consult Note (Signed)
Consultation Note Date: 09/21/2015   Patient Name: Kara Wilkerson  DOB: [birth year]  MRN: [id number]  Age / Sex: 82 y.o., female  PCP: No primary care provider on file. Referring Physician: Alison Murray, MD  Reason for Consultation: Establishing GOC and emotional support  HPI/Patient Profile: 82 y.o. 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 Patient admitted for observation thru  ED with gradually worsening SOB onset last night. Associated chest tightness and productive cough for 2 weeks. These have been off and on for the past 2 years but symptoms have recently become progressively worse and constant.    In the ED she is found to have a BNP of 2100 , CXR shows developing bilateral pleural effusions. Cardiac enlargement.  Patient faced advanced directive decision and anticipatory care needs  Clinical Assessment and Goals of Care:  This NP Lorinda Creed reviewed medical records, received report from team, assessed the patient and then meet at the patient's bedside   to discuss diagnosis, prognosis, GOC, EOL wishes disposition and options.  I then spoke by telephone with niece/main support Douskas Huntley person regarding the same as above   A discussion was had today regarding advanced directives.    The difference between a aggressive medical intervention path  and a palliative comfort care path for this patient at this time was had.  Values and goals of care important to patient and family were attempted to be elicited.  Concept of Hospice and Palliative Care were discussed   Questions and concerns addressed.   Family encouraged to call with questions or concerns.  PMT will continue to support holistically.    SUMMARY OF RECOMMENDATIONS  -focus of care is comfort and  dignity  -once medically stable dc back to SNF with Hospice services in place     Code Status/Advance Care Planning:  DNR/DNI   Palliative Prophylaxis:   -comfort assessment   Psycho-social/Spiritual:    Additional Recommendations:  Education on Hospice  Prognosis:   - less than six months  Discharge Planning: Back to SNF with Hospice services     Primary Diagnoses: Present on Admission:  . Acute on chronic combined systolic (congestive) and diastolic (congestive) heart failure (HCC) . CKD (chronic kidney disease) stage 3, GFR 30-59 ml/min . Congestive dilated cardiomyopathy (HCC) . Essential hypertension, benign  I have reviewed the medical record, interviewed the patient and family, and examined the patient. The following aspects are pertinent.  Past Medical History  Diagnosis Date  . CHF (congestive heart failure) (HCC)   . A-fib (HCC)   . Hypertension   . Constipation   . Arthritis   . Chronic systolic heart failure (HCC)   . Dependence on supplemental oxygen   . Other chronic pain   . Contracture of ankle and foot joint   . Muscle weakness (generalized)   . Lack of coordination   . Unspecified essential hypertension   .  Edema   . GERD (gastroesophageal reflux disease)   . Hyperlipidemia    Social History   Social History  . Marital Status: Widowed    Spouse Name: N/A  . Number of Children: N/A  . Years of Education: N/A   Social History Main Topics  . Smoking status: Never Smoker   . Smokeless tobacco: Never Used  . Alcohol Use: No  . Drug Use: No  . Sexual Activity: Not Asked   Other Topics Concern  . None   Social History Narrative   Family History  Problem Relation Age of Onset  . Diabetes Mother   . Hypertension Mother   . Cancer Brother    Scheduled Meds: . acetaminophen  1,000 mg Oral BID  . calcium-vitamin D  1 tablet Oral BID  . cefTRIAXone (ROCEPHIN)  IV  1 g Intravenous Q24H  . diclofenac sodium  4 g Topical BID  .  feeding supplement (ENSURE ENLIVE)  237 mL Oral Q24H  . ferrous sulfate  325 mg Oral Q breakfast  . fluticasone  2 spray Each Nare Daily  . furosemide  40 mg Intravenous BID  . hydroxypropyl methylcellulose / hypromellose  1 drop Both Eyes Daily  . lidocaine  1 patch Transdermal Q24H  . lisinopril  2.5 mg Oral Daily  . polyethylene glycol  17 g Oral Daily  . potassium chloride SA  40 mEq Oral Daily  . Rivaroxaban  15 mg Oral Q supper  . senna-docusate  2 tablet Oral QHS  . sertraline  150 mg Oral Daily   Continuous Infusions:  PRN Meds:.acetaminophen, bisacodyl, guaiFENesin, magnesium hydroxide, meclizine, ondansetron (ZOFRAN) IV, oxyCODONE Medications Prior to Admission:  Prior to Admission medications   Medication Sig Start Date End Date Taking? Authorizing Provider  acetaminophen (TYLENOL) 500 MG tablet Take 1,000 mg by mouth 2 (two) times daily.   Yes Historical Provider, MD  bisacodyl (DULCOLAX) 10 MG suppository Place 10 mg rectally daily as needed for moderate constipation.   Yes Historical Provider, MD  calcium-vitamin D (OSCAL WITH D) 500-200 MG-UNIT tablet Take 1 tablet by mouth 2 (two) times daily.   Yes Historical Provider, MD  diclofenac sodium (VOLTAREN) 1 % GEL Apply 4 g topically 2 (two) times daily. To both knees   Yes Historical Provider, MD  feeding supplement, ENSURE ENLIVE, (ENSURE ENLIVE) LIQD Take 237 mLs by mouth daily. 09/25/14  Yes Yates Decamp, MD  ferrous sulfate 325 (65 FE) MG tablet Take 325 mg by mouth daily with breakfast.   Yes Historical Provider, MD  fluticasone (FLONASE) 50 MCG/ACT nasal spray Place 2 sprays into the nose daily. For allergies   Yes Historical Provider, MD  glucosamine-chondroitin 500-400 MG tablet Take 2 tablets by mouth 2 (two) times daily. For osteoarthritis   Yes Historical Provider, MD  guaiFENesin (ROBITUSSIN) 100 MG/5ML SOLN Take 10 mLs by mouth every 6 (six) hours as needed for cough or to loosen phlegm.   Yes Historical Provider, MD    lidocaine (LIDODERM) 5 % Place 1 patch onto the skin daily. Remove & Discard patch within 12 hours to left shoulder   Yes Historical Provider, MD  lisinopril (PRINIVIL,ZESTRIL) 2.5 MG tablet Take 2.5 mg by mouth daily. Hold for SBP less than 100.   Yes Historical Provider, MD  magnesium hydroxide (MILK OF MAGNESIA) 400 MG/5ML suspension Take 30-60 mLs by mouth daily as needed for mild constipation or moderate constipation.    Yes Historical Provider, MD  meclizine (ANTIVERT) 12.5 MG tablet  Take 12.5 mg by mouth 2 (two) times daily as needed for dizziness. For dizziness   Yes Historical Provider, MD  oxyCODONE (OXY IR/ROXICODONE) 5 MG immediate release tablet Take one tablet by mouth every four hours as needed for pain 11/17/14  Yes Sharon Seller, NP  Polyethyl Glycol-Propyl Glycol (SYSTANE) 0.4-0.3 % SOLN Apply 1 drop to eye daily. Administer one drop in to each eye once daily for eye irritation.   Yes Historical Provider, MD  polyethylene glycol (MIRALAX / GLYCOLAX) packet Take 17 g by mouth daily. For constipation   Yes Historical Provider, MD  potassium chloride SA (K-DUR,KLOR-CON) 20 MEQ tablet Take 2 tablets (40 mEq total) by mouth once. Patient taking differently: Take 40 mEq by mouth daily.  09/25/14  Yes Yates Decamp, MD  Rivaroxaban (XARELTO) 15 MG TABS tablet Take 15 mg by mouth daily with supper. Give 1 tablet by mouth daily after evening meal for A-Fib.   Yes Historical Provider, MD  sennosides-docusate sodium (SENOKOT-S) 8.6-50 MG tablet Take 2 tablets by mouth at bedtime.    Yes Historical Provider, MD  sertraline (ZOLOFT) 50 MG tablet Take 150 mg by mouth daily. Give 1 tab along with Zoloft 100 mg to equal 150 mg daily by mouth for depression and anxiety   Yes Historical Provider, MD  Sodium Phosphates (RA SALINE ENEMA RE) Place 1 Applicatorful rectally daily as needed (constipation).   Yes Historical Provider, MD  torsemide (DEMADEX) 20 MG tablet Take 20 mg by mouth 2 (two) times daily.  For congestive heart failure 12/31/12  Yes Astrid Divine, NP   Allergies  Allergen Reactions  . Codeine Other (See Comments)    Unk   Review of Systems  Constitutional: Positive for fatigue.  Neurological: Positive for weakness.    Physical Exam  Constitutional: She appears well-developed. She appears ill.  Cardiovascular: Normal rate, regular rhythm and normal heart sounds.   Pulmonary/Chest: She has decreased breath sounds in the right lower field and the left lower field.  Abdominal: Soft. Bowel sounds are normal.  Neurological: She is alert.  Skin: Skin is warm and dry.    Vital Signs: BP 102/70 mmHg  Pulse 95  Temp(Src) 97.8 F (36.6 C) (Oral)  Resp 18  Ht   Wt 85.1 kg (187 lb 9.8 oz)  SpO2 100% Pain Assessment: No/denies pain       SpO2: SpO2: 100 % O2 Device:SpO2: 100 % O2 Flow Rate: .O2 Flow Rate (L/min): 2 L/min  IO: Intake/output summary:  Intake/Output Summary (Last 24 hours) at 09/21/15 1450 Last data filed at 09/21/15 0458  Gross per 24 hour  Intake    170 ml  Output    800 ml  Net   -630 ml    LBM: Last BM Date: 09/17/15 Baseline Weight: Weight: 81.647 kg (180 lb) Most recent weight: Weight: 85.1 kg (187 lb 9.8 oz)      Palliative Assessment/Data:  30%     Time In: 1530 Time Out: 1645 Time Total: 75 min Greater than 50%  of this time was spent counseling and coordinating care related to the above assessment and plan.  Signed by: Lorinda Creed, NP   Please contact Palliative Medicine Team phone at [phone] for questions and concerns.  For individual provider: See Loretha Stapler

## 2015-09-21 NOTE — ED Notes (Signed)
Dr. Garner at bedside. 

## 2015-09-21 NOTE — Care Management Obs Status (Signed)
MEDICARE OBSERVATION STATUS NOTIFICATION   Patient Details  Name: Kara Wilkerson MRN: [id number] Date of Birth: 08-Sep-1922   Medicare Observation Status Notification Given:  Yes    Darrold Span, RN 09/21/2015, 10:25 AM

## 2015-09-21 NOTE — ED Notes (Signed)
Dr.Gardner paged regarding urinalysis

## 2015-09-22 ENCOUNTER — Encounter: Payer: Self-pay | Admitting: Internal Medicine

## 2015-09-22 ENCOUNTER — Non-Acute Institutional Stay (SKILLED_NURSING_FACILITY): Payer: Medicare Other | Admitting: Internal Medicine

## 2015-09-22 DIAGNOSIS — N183 Chronic kidney disease, stage 3 unspecified: Secondary | ICD-10-CM

## 2015-09-22 DIAGNOSIS — I5043 Acute on chronic combined systolic (congestive) and diastolic (congestive) heart failure: Secondary | ICD-10-CM

## 2015-09-22 DIAGNOSIS — N3000 Acute cystitis without hematuria: Secondary | ICD-10-CM

## 2015-09-22 DIAGNOSIS — I4891 Unspecified atrial fibrillation: Secondary | ICD-10-CM

## 2015-09-22 DIAGNOSIS — D649 Anemia, unspecified: Secondary | ICD-10-CM

## 2015-09-22 DIAGNOSIS — I420 Dilated cardiomyopathy: Secondary | ICD-10-CM

## 2015-09-22 DIAGNOSIS — H8113 Benign paroxysmal vertigo, bilateral: Secondary | ICD-10-CM

## 2015-09-22 DIAGNOSIS — N390 Urinary tract infection, site not specified: Secondary | ICD-10-CM | POA: Diagnosis present

## 2015-09-22 LAB — BASIC METABOLIC PANEL
ANION GAP: 8 (ref 5–15)
BUN: 25 mg/dL — ABNORMAL HIGH (ref 6–20)
CALCIUM: 9.5 mg/dL (ref 8.9–10.3)
CO2: 27 mmol/L (ref 22–32)
Chloride: 103 mmol/L (ref 101–111)
Creatinine, Ser: 1.75 mg/dL — ABNORMAL HIGH (ref 0.44–1.00)
GFR calc Af Amer: 28 mL/min — ABNORMAL LOW (ref >60)
GFR, EST NON AFRICAN AMERICAN: 24 mL/min — AB (ref >60)
GLUCOSE: 98 mg/dL (ref 65–99)
POTASSIUM: 3.8 mmol/L (ref 3.5–5.1)
Sodium: 138 mmol/L (ref 135–145)

## 2015-09-22 LAB — CBC
HCT: 32.4 % — ABNORMAL LOW (ref 36.0–46.0)
HEMOGLOBIN: 10 g/dL — AB (ref 12.0–15.0)
MCH: 27 pg (ref 26.0–34.0)
MCHC: 30.9 g/dL (ref 30.0–36.0)
MCV: 87.6 fL (ref 78.0–100.0)
Platelets: 260 10*3/uL (ref 150–400)
RBC: 3.7 MIL/uL — ABNORMAL LOW (ref 3.87–5.11)
RDW: 17.2 % — AB (ref 11.5–15.5)
WBC: 7.9 10*3/uL (ref 4.0–10.5)

## 2015-09-22 MED ORDER — NITROFURANTOIN MONOHYD MACRO 100 MG PO CAPS: 100.0000 mg | ORAL_CAPSULE | Freq: Two times a day (BID) | ORAL | Status: AC

## 2015-09-22 MED ORDER — AMIODARONE HCL 200 MG PO TABS: 200.0000 mg | ORAL_TABLET | Freq: Two times a day (BID) | ORAL | Status: AC

## 2015-09-22 MED ORDER — AMIODARONE HCL 200 MG PO TABS
400.0000 mg | ORAL_TABLET | Freq: Two times a day (BID) | ORAL | Status: DC
  Administered 2015-09-22: 400 mg via ORAL
  Filled 2015-09-22: qty 2

## 2015-09-22 MED ORDER — OXYCODONE HCL 5 MG PO TABS: ORAL_TABLET | ORAL | Status: DC

## 2015-09-22 NOTE — Progress Notes (Signed)
Palliative Medicine RN Note: Rec'd request from PMT NP to ensure referral made to hospice so pt will be seen at [facility]. Spoke with SNF; they use [facility]. Called referral in to [facility] intake.   Donn Pierini, RN, BSN, [facility] 09/22/2015 8:45 AM Cell [phone] 8:00-4:00 Monday-Friday Office [phone]

## 2015-09-22 NOTE — Progress Notes (Signed)
Patient to D/C to [facility]. Personal belongings given to the patient. IV removed. Tele monitor removed. CCMD notified. PTAR to transport to facility.

## 2015-09-22 NOTE — Progress Notes (Addendum)
Subjective:  Reports improvement in SOB, continues to have brief episodes of a.fib with RVR.   Objective:  Vital Signs in the last 24 hours: Temp:  [97.8 F (36.6 C)-98.2 F (36.8 C)] 98 F (36.7 C) (06/14 0418) Pulse Rate:  [93-102] 93 (06/14 0418) Resp:  [18] 18 (06/14 0418) BP: (90-115)/(51-75) 112/56 mmHg (06/14 0418) SpO2:  [100 %] 100 % (06/14 0418) Weight:  [80.2 kg (176 lb 12.9 oz)] 80.2 kg (176 lb 12.9 oz) (06/14 0418)  Intake/Output from previous day: 06/13 0701 - 06/14 0700 In: 240 [P.O.:240] Out: 900 [Urine:900]  Physical Exam: General appearance: alert, cooperative, appears stated age and no distress Lungs: faint bilateral basal crackles heard, left lung sounds diminished. Heart: Distant heart sounds. Abdomen: soft, non-tender; bowel sounds normal; no masses, no organomegaly and obese. Extremities: edema trace bilateral lower extremity pitting edema. Full range of motion movements. Neurologic: Grossly normal  Lab Results: BMP  Recent Labs  09/25/14 0323  08/13/15 09/20/15 2325 09/22/15 0303  NA 138  < > 141 137 138  K 5.1  < > 3.7 3.9 3.8  CL 102  --   --  105 103  CO2 28  --   --  24 27  GLUCOSE 87  --   --  126* 98  BUN 37*  < > 31* 26* 25*  CREATININE 2.37*  < > 1.6* 1.82* 1.75*  CALCIUM 9.4  --   --  10.0 9.5  GFRNONAA 17*  --   --  23* 24*  GFRAA 20*  --   --  27* 28*  < > = values in this interval not displayed.  CBC  Recent Labs Lab 09/22/15 0303  WBC 7.9  RBC 3.70*  HGB 10.0*  HCT 32.4*  PLT 260  MCV 87.6  MCH 27.0  MCHC 30.9  RDW 17.2*    HEMOGLOBIN A1C No results found for: HGBA1C, MPG  Cardiac Panel (last 3 results) No results for input(s): CKTOTAL, CKMB, TROPONINI, RELINDX in the last 8760 hours.  BNP (last 3 results) No results for input(s): PROBNP in the last 8760 hours.  TSH  Recent Labs  09/24/14 0819  TSH 1.135    CHOLESTEROL No results for input(s): CHOL in the last 8760 hours.  Hepatic Function  Panel  Recent Labs  09/23/14 2309 09/24/14 0550 08/09/15  PROT 7.7 7.1  --   ALBUMIN 3.6 3.3*  --   AST 18 26 11*  ALT 13* 11* 10  ALKPHOS 103 95 78  BILITOT 0.7 1.2  --   BILIDIR <0.1*  --   --   IBILI NOT CALCULATED  --   --     Imaging: Dg Chest 2 View  09/20/2015  CLINICAL DATA:  Shortness of breath tonight. No chest pain. History of CHF EXAM: CHEST  2 VIEW COMPARISON:  09/23/2014 FINDINGS: Cardiac enlargement without vascular congestion. Since the previous study, there is developing bilateral pleural effusions, greater on the left, with infiltration or atelectasis in the left lung base. Changes could represent fluid overload. Superimposed pneumonia on the left is not excluded. No pneumothorax. Mediastinal contours appear intact. Degenerative changes in the spine and shoulders. IMPRESSION: Developing bilateral pleural effusions, greater on the left, with infiltration or atelectasis in the left lung base. Cardiac enlargement. Electronically Signed   By: Burman Nieves M.D.   On: 09/20/2015 23:39    Cardiac Studies: EKG 09/21/2015: Sinus rhythm at a rate of 88 bpm, leftward axis, PAC. Left bundle branch block.  No further analysis due to LBBB. No change from outpatient EKG on 07/19/2015.  Echo 09/24/2014: - Left ventricle: The cavity size was mildly dilated. There was mild concentric hypertrophy. Systolic function was severely reduced. The estimated ejection fraction was in the range of 20-25%. Diffuse hypokinesis. Doppler parameters are consistent with abnormal left ventricular relaxation (grade 1 diastolic dysfunction). Doppler parameters are consistent with high ventricular filling pressure. - Ventricular septum: Septal motion showed moderate dyssynergy. These changes are consistent with a left bundle branch block. - Mitral valve: Calcified annulus. There was mild regurgitation. - Left atrium: The atrium was mildly dilated. - Atrial septum: No defect or patent foramen ovale was  identified.   Assessment/Plan:  1. Dilated cardiomyopathy with severe left ventricular systolic dysfunction 2. Acute on chronic combined systolic and diastolic heart failure due to recurrent a.fib with RVR 3. Paroxysmal atrial fibrillation (CHA2DS2-VASCScore: Risk Score 5, Yearly risk of stroke 6.7%. OAC Has Bled: Score 2. Estimated risk of major bleeding at 1 year with OAC 1.88-3.2%. On Xarelto.) 4. Essential hypertension  5. CKD stage III 6. DNR status  Recommendation: Symptoms improving despite recurrent episodes of a.fib with RVR. Will increase amiodarone to  bid. Unable to tolerate beta blocker therapy in the past due to bradycardia.    Erling Conte, NP-C 09/22/2015, 8:32 AM [facility] Pager: [phone] Office: [phone] If no answer: [phone]   Yates Decamp, MD 09/22/2015, 9:24 PM [facility] Pager: [phone] Office: [phone] If no answer: Cell:  [phone]     I have personally reviewed the patient's record and agree with the assessment and plan of Ms. Marcy Salvo, NP-C. Her symptoms probably related to A. Fib with RVR and unable to use beta blockers due to symptomatic bradycardia in past. Conservative therapy given advanced age and her co-morbidities     I will follow up in the OP basis in one month. Will call NH to reduce Amio to 200 mg daily after 10 days.

## 2015-09-22 NOTE — Clinical Social Work Note (Signed)
Per MD patient is ready to discharge to [facility] . RN, patient, patient's family, and facility notified of discharge. RN given phone number for report and transport packet is on patient's chart. Ambulance transport requested. CSW signing off.  [employer], LCSW [phone]

## 2015-09-22 NOTE — Care Management Note (Signed)
Case Management Note Donn Pierini RN, BSN Unit 2W-Case Manager [phone]  Patient Details  Name: Kara Wilkerson MRN: [id number] Date of Birth: 1922-05-17  Subjective/Objective:   Pt admitted with HF                 Action/Plan: PTA Pt was at [facility]- plan to return to SNF- CSW following for placement needs  Expected Discharge Date:   09/22/15               Expected Discharge Plan:  Skilled Nursing Facility  In-House Referral:  Clinical Social Work  Discharge planning Services  CM Consult  Post Acute Care Choice:    Choice offered to:     DME Arranged:    DME Agency:     HH Arranged:    HH Agency:     Status of Service:  Completed, signed off  Medicare Important Message Given:    Date Medicare IM Given:    Medicare IM give by:    Date Additional Medicare IM Given:    Additional Medicare Important Message give by:     If discussed at Long Length of Stay Meetings, dates discussed:    Additional Comments:  Darrold Span, RN 09/22/2015, 4:20 PM

## 2015-09-22 NOTE — Progress Notes (Signed)
PT Cancellation Note  Patient Details Name: Kirin Aufderheide MRN: [id number] DOB: Jun 23, 1922   Cancelled Treatment:    Reason Eval/Treat Not Completed: PT screened, no needs identified, will sign off   Noted discharge plan (per Palliative note) is to return to SNF with Hospice services. Patient is a long-term resident (4 yrs) at [name]. No acute PT evaluation is indicated.  Gerturde Kuba 09/22/2015, 7:59 AM  Pager [phone]

## 2015-09-22 NOTE — Consult Note (Signed)
   [facility] CM Inpatient Consult   09/22/2015  Merica Murden Jan 02, 1923 [id number]   Patient screened for potential [facility] Care Management services. Chart reviewed. Noted discharge plan is for return to SNF with hospice follow up. [facility] Care Management not appropriate at this time.   Raiford Noble, MSN-Ed, RN,BSN [facility] Liaison [phone]

## 2015-09-22 NOTE — Discharge Summary (Signed)
Physician Discharge Summary  Kara Wilkerson:[id number] DOB: Jun 15, 1922 DOA: 09/20/2015  PCP: Margit Hanks, MD  Admit date: 09/20/2015 Discharge date: 09/22/2015   Recommendations for Outpatient Follow-Up:   1. Hospice to follow patient at SNF. To prevent exacerbations of heart failure, it is important that her weight is checked at the same time every day, and that if she gains over 3 pounds in 24 hours or 5 lbs in 1 week, OR you develops worsening swelling to the legs, experience more shortness of breath or chest pain, that her Primary MD or cardiologist is called for further instructions. Follow a heart healthy, low salt diet and restrict fluid intake to 1.5 liters a day or less.   Discharge Diagnosis:   Principal Problem:    Acute on chronic combined systolic (congestive) and diastolic (congestive) heart failure (HCC) Active Problems:    Essential hypertension, benign    CKD (chronic kidney disease) stage 3, GFR 30-59 ml/min    Congestive dilated cardiomyopathy (HCC)    DNR (do not resuscitate)    UTI    Atrial fibrillation with RVR   Discharge disposition:  SNF: [facility]  Discharge Condition: Improved.  Diet recommendation: Low sodium, heart healthy.    History of Present Illness:    Kara Wilkerson is an 82 y.o. female with past medical history significant for severe dilated cardiomyopathy baseline ejection fraction of 20% and grade 1 diastolic dysfunction on 2-D echo in June 2016, atrial fibrillation on anticoagulation with xarelto who Who was admitted 09/20/15 with worsening shortness of breath started the night prior to this admission associated with chest tightness and cough productive of clear sputum. No fevers or chills.  Patient was hemodynamically stable on the admission. Blood work was notable for hemoglobin of 10.4, creatinine 1.82. BNP was elevated at 2163. Chest x-ray showed bilateral pleural effusions greater on the left. In addition urinalysis  showed large leukocytes and many bacteria. Patient started on Rocephin.   Hospital Course by Problem:   Principal Problem:  Acute on chronic combined systolic (congestive) and diastolic (congestive) heart failure (HCC) triggered by rapid atrial fibrillation in the setting of congestive dilated cardiomyopathy - Appears to have been triggered by rapid A. Fib. Amiodarone started by cardiologist. Bonnee Quin of beta blockers. - Last 2 D ECHO in 09/2014 with EF 20% and grade 1 diastolic dysfunction. - BNP on this admission 2163. - Pt on IV lasix 40 mg BID, resume Bumex at D/C.  Active Problems:   UTI - Treated with Rocephin, D/C on Macrobid x 3 more days.  Cultures not sent.   Essential hypertension, benign - BP controlled.   CKD (chronic kidney disease) stage 3, GFR 30-59 ml/min - Creatinine stable at D/C.   DNR (do not resuscitate) Seen by palliative care team with focus of care comfort and dignity.    Medical Consultants:    Cardiology   Discharge Exam:   Filed Vitals:   09/21/15 1903 09/22/15 0418  BP: 115/75 112/56  Pulse: 102 93  Temp: 98.2 F (36.8 C) 98 F (36.7 C)  Resp: 18 18   Filed Vitals:   09/21/15 1055 09/21/15 1444 09/21/15 1903 09/22/15 0418  BP: 90/51 102/70 115/75 112/56  Pulse:  95 102 93  Temp:  97.8 F (36.6 C) 98.2 F (36.8 C) 98 F (36.7 C)  TempSrc:  Oral Oral Oral  Resp:  18 18 18   Weight:    80.2 kg (176 lb 12.9 oz)  SpO2:  100% 100% 100%  Gen:  NAD Cardiovascular:  HSIR, No M/R/G Respiratory: Lungs CTAB Gastrointestinal: Abdomen soft, NT/ND with normal active bowel sounds. Extremities: 1-2+ edema   The results of significant diagnostics from this hospitalization (including imaging, microbiology, ancillary and laboratory) are listed below for reference.     Procedures and Diagnostic Studies:   Dg Chest 2 View  09/20/2015  CLINICAL DATA:  Shortness of breath tonight. No chest pain. History of CHF EXAM: CHEST  2 VIEW  COMPARISON:  09/23/2014 FINDINGS: Cardiac enlargement without vascular congestion. Since the previous study, there is developing bilateral pleural effusions, greater on the left, with infiltration or atelectasis in the left lung base. Changes could represent fluid overload. Superimposed pneumonia on the left is not excluded. No pneumothorax. Mediastinal contours appear intact. Degenerative changes in the spine and shoulders. IMPRESSION: Developing bilateral pleural effusions, greater on the left, with infiltration or atelectasis in the left lung base. Cardiac enlargement. Electronically Signed   By: Burman Nieves M.D.   On: 09/20/2015 23:39     Labs:   Basic Metabolic Panel:  Recent Labs Lab 09/20/15 2325 09/22/15 0303  NA 137 138  K 3.9 3.8  CL 105 103  CO2 24 27  GLUCOSE 126* 98  BUN 26* 25*  CREATININE 1.82* 1.75*  CALCIUM 10.0 9.5   GFR Estimated Creatinine Clearance: 21.4 mL/min (by C-G formula based on Cr of 1.75). Liver Function Tests: No results for input(s): AST, ALT, ALKPHOS, BILITOT, PROT, ALBUMIN in the last 168 hours. No results for input(s): LIPASE, AMYLASE in the last 168 hours. No results for input(s): AMMONIA in the last 168 hours. Coagulation profile No results for input(s): INR, PROTIME in the last 168 hours.  CBC:  Recent Labs Lab 09/20/15 2325 09/22/15 0303  WBC 9.4 7.9  HGB 10.4* 10.0*  HCT 33.7* 32.4*  MCV 87.8 87.6  PLT 281 260   Microbiology Recent Results (from the past 240 hour(s))  MRSA PCR Screening     Status: None   Collection Time: 09/21/15  3:19 AM  Result Value Ref Range Status   MRSA by PCR NEGATIVE NEGATIVE Final    Comment:        The GeneXpert MRSA Assay (FDA approved for NASAL specimens only), is one component of a comprehensive MRSA colonization surveillance program. It is not intended to diagnose MRSA infection nor to guide or monitor treatment for MRSA infections.      Discharge Instructions:         Discharge Instructions    (HEART FAILURE PATIENTS) Call MD:  Anytime you have any of the following symptoms: 1) 3 pound weight gain in 24 hours or 5 pounds in 1 week 2) shortness of breath, with or without a dry hacking cough 3) swelling in the hands, feet or stomach 4) if you have to sleep on extra pillows at night in order to breathe.    Complete by:  As directed      Call MD for:  severe uncontrolled pain    Complete by:  As directed      Diet - low sodium heart healthy    Complete by:  As directed      Increase activity slowly    Complete by:  As directed             Medication List    TAKE these medications        acetaminophen 500 MG tablet  Commonly known as:  TYLENOL  Take 1,000 mg by mouth 2 (two)  times daily.     amiodarone 200 MG tablet  Commonly known as:  PACERONE  Take 1 tablet (200 mg total) by mouth 2 (two) times daily.     bisacodyl 10 MG suppository  Commonly known as:  DULCOLAX  Place 10 mg rectally daily as needed for moderate constipation.     calcium-vitamin D 500-200 MG-UNIT tablet  Commonly known as:  OSCAL WITH D  Take 1 tablet by mouth 2 (two) times daily.     diclofenac sodium 1 % Gel  Commonly known as:  VOLTAREN  Apply 4 g topically 2 (two) times daily. To both knees     feeding supplement (ENSURE ENLIVE) Liqd  Take 237 mLs by mouth daily.     ferrous sulfate 325 (65 FE) MG tablet  Take 325 mg by mouth daily with breakfast.     fluticasone 50 MCG/ACT nasal spray  Commonly known as:  FLONASE  Place 2 sprays into the nose daily. For allergies     glucosamine-chondroitin 500-400 MG tablet  Take 2 tablets by mouth 2 (two) times daily. For osteoarthritis     guaiFENesin 100 MG/5ML Soln  Commonly known as:  ROBITUSSIN  Take 10 mLs by mouth every 6 (six) hours as needed for cough or to loosen phlegm.     lidocaine 5 %  Commonly known as:  LIDODERM  Place 1 patch onto the skin daily. Remove & Discard patch within 12 hours to left shoulder      lisinopril 2.5 MG tablet  Commonly known as:  PRINIVIL,ZESTRIL  Take 2.5 mg by mouth daily. Hold for SBP less than 100.     magnesium hydroxide 400 MG/5ML suspension  Commonly known as:  MILK OF MAGNESIA  Take 30-60 mLs by mouth daily as needed for mild constipation or moderate constipation.     meclizine 12.5 MG tablet  Commonly known as:  ANTIVERT  Take 12.5 mg by mouth 2 (two) times daily as needed for dizziness. For dizziness     nitrofurantoin (macrocrystal-monohydrate) 100 MG capsule  Commonly known as:  MACROBID  Take 1 capsule (100 mg total) by mouth 2 (two) times daily.     oxyCODONE 5 MG immediate release tablet  Commonly known as:  Oxy IR/ROXICODONE  Take one tablet by mouth every four hours as needed for pain     polyethylene glycol packet  Commonly known as:  MIRALAX / GLYCOLAX  Take 17 g by mouth daily. For constipation     potassium chloride SA 20 MEQ tablet  Commonly known as:  K-DUR,KLOR-CON  Take 2 tablets (40 mEq total) by mouth once.     RA SALINE ENEMA RE  Place 1 Applicatorful rectally daily as needed (constipation).     Rivaroxaban 15 MG Tabs tablet  Commonly known as:  XARELTO  Take 15 mg by mouth daily with supper. Give 1 tablet by mouth daily after evening meal for A-Fib.     sennosides-docusate sodium 8.6-50 MG tablet  Commonly known as:  SENOKOT-S  Take 2 tablets by mouth at bedtime.     sertraline 50 MG tablet  Commonly known as:  ZOLOFT  Take 150 mg by mouth daily. Give 1 tab along with Zoloft 100 mg to equal 150 mg daily by mouth for depression and anxiety     SYSTANE 0.4-0.3 % Soln  Generic drug:  Polyethyl Glycol-Propyl Glycol  Apply 1 drop to eye daily. Administer one drop in to each eye once daily for eye irritation.  torsemide 20 MG tablet  Commonly known as:  DEMADEX  Take 20 mg by mouth 2 (two) times daily. For congestive heart failure          Time coordinating discharge: 35  minutes.  Signed:  Aprille Sawhney  Pager [phone] [facility] 09/22/2015, 9:25 AM

## 2015-09-22 NOTE — Progress Notes (Signed)
MRN: [id number] Name: Kara Wilkerson  Sex: female Age: 82 y.o. DOB: 03/04/1923  PSC #:  Facility/Room: [facility] /  [address] Level Of Care: SNF Provider: Randon Goldsmith. Lyn Hollingshead, MD Emergency Contacts: Extended Emergency Contact Information Primary Emergency Contact: Huntely,Douschkas Address: [address] [place], [place] [address] Darden Amber of [place] Home Phone: [phone] Relation: Niece Secondary Emergency Contact: [facility] Address: [address] [place], [place] [address] Darden [place] of [place] Phone: [phone] Relation: Other  Code Status: DNR  Allergies: Codeine  Chief Complaint  Patient presents with  . New Admit To SNF    Admit to Facility    HPI: Patient is 82 y.o. female with severe dilated cardiomyopathy baseline ejection fraction of 20% and grade 1 diastolic dysfunction on 2-D echo in June 2016, atrial fibrillation on anticoagulation with xarelto who Who was admitted 09/20/15 with worsening shortness of breath started the night prior to this admission associated with chest tightness and cough productive of clear sputum. No fevers or chills.  Patient was hemodynamically stable on the admission. Blood work was notable for hemoglobin of 10.4, creatinine 1.82. BNP was elevated at 2163. Chest x-ray showed bilateral pleural effusions greater on the left.  Pt was admitted to [facility] from 6/12-14 where she was treated for acute on chronic CHF and for a UTI. Pt is admitted back to SNF for residential care. While at SNF pt will be followed for HTN, tx with lisinopril and demadex, vertigo, tx with antivert and anemia, tx with iron.  Past Medical History  Diagnosis Date  . CHF (congestive heart failure) (HCC)   . A-fib (HCC)   . Hypertension   . Constipation   . Arthritis   . Chronic systolic heart failure (HCC)   . Dependence on supplemental oxygen   . Other chronic pain   . Contracture of ankle and foot joint   .  Muscle weakness (generalized)   . Lack of coordination   . Unspecified essential hypertension   . Edema   . GERD (gastroesophageal reflux disease)   . Hyperlipidemia   . Depression   . Chronic kidney disease     Stage III-IV    Past Surgical History  Procedure Laterality Date  . Right ankle orif    . Fracture surgery        Medication List       This list is accurate as of: 09/22/15 11:59 PM.  Always use your most recent med list.               acetaminophen 500 MG tablet  Commonly known as:  TYLENOL  Take 1,000 mg by mouth 2 (two) times daily.     amiodarone 200 MG tablet  Commonly known as:  PACERONE  Take 1 tablet (200 mg total) by mouth 2 (two) times daily.     bisacodyl 10 MG suppository  Commonly known as:  DULCOLAX  Place 10 mg rectally daily as needed for moderate constipation.     calcium-vitamin D 500-200 MG-UNIT tablet  Commonly known as:  OSCAL WITH D  Take 1 tablet by mouth 2 (two) times daily.     diclofenac sodium 1 % Gel  Commonly known as:  VOLTAREN  Apply 4 g topically 2 (two) times daily. To both knees     feeding supplement (ENSURE ENLIVE) Liqd  Take 237 mLs by mouth daily.  ferrous sulfate 325 (65 FE) MG tablet  Take 325 mg by mouth daily with breakfast.     fluticasone 50 MCG/ACT nasal spray  Commonly known as:  FLONASE  Place 2 sprays into the nose daily. For allergies     glucosamine-chondroitin 500-400 MG tablet  Take 2 tablets by mouth 2 (two) times daily. For osteoarthritis     guaiFENesin 100 MG/5ML Soln  Commonly known as:  ROBITUSSIN  Take 10 mLs by mouth every 6 (six) hours as needed for cough or to loosen phlegm.     lidocaine 5 %  Commonly known as:  LIDODERM  Place 1 patch onto the skin daily. Remove & Discard patch within 12 hours to left shoulder     lisinopril 2.5 MG tablet  Commonly known as:  PRINIVIL,ZESTRIL  Take 2.5 mg by mouth daily. Hold for SBP less than 100.     magnesium hydroxide 400 MG/5ML  suspension  Commonly known as:  MILK OF MAGNESIA  Take 30 mLs by mouth daily as needed for mild constipation or moderate constipation.     meclizine 12.5 MG tablet  Commonly known as:  ANTIVERT  Take 12.5 mg by mouth 2 (two) times daily as needed for dizziness. For dizziness     nitrofurantoin (macrocrystal-monohydrate) 100 MG capsule  Commonly known as:  MACROBID  Take 1 capsule (100 mg total) by mouth 2 (two) times daily.     oxyCODONE 5 MG immediate release tablet  Commonly known as:  Oxy IR/ROXICODONE  Take one tablet by mouth every four hours as needed for pain     polyethylene glycol packet  Commonly known as:  MIRALAX / GLYCOLAX  Take 17 g by mouth daily. For constipation     potassium chloride SA 20 MEQ tablet  Commonly known as:  K-DUR,KLOR-CON  Take 2 tablets (40 mEq total) by mouth once.     RA SALINE ENEMA RE  Place 1 Applicatorful rectally daily as needed (constipation).     Rivaroxaban 15 MG Tabs tablet  Commonly known as:  XARELTO  Take 15 mg by mouth daily with supper. Give 1 tablet by mouth daily after evening meal for A-Fib.     sennosides-docusate sodium 8.6-50 MG tablet  Commonly known as:  SENOKOT-S  Take 2 tablets by mouth at bedtime.     sertraline 50 MG tablet  Commonly known as:  ZOLOFT  Take 50 mg by mouth daily.     SYSTANE 0.4-0.3 % Soln  Generic drug:  Polyethyl Glycol-Propyl Glycol  Apply 1 drop to eye daily. Administer one drop in to each eye once daily for eye irritation.     torsemide 20 MG tablet  Commonly known as:  DEMADEX  Take 20 mg by mouth 2 (two) times daily. For congestive heart failure        No orders of the defined types were placed in this encounter.    Immunization History  Administered Date(s) Administered  . Influenza Whole 01/22/2013  . Influenza-Unspecified 01/11/2012, 01/20/2014, 01/04/2015  . PPD Test 11/20/2011    Social History  Substance Use Topics  . Smoking status: Never Smoker   . Smokeless  tobacco: Never Used  . Alcohol Use: No    Family history is   Family History  Problem Relation Age of Onset  . Diabetes Mother   . Hypertension Mother   . Cancer Brother       Review of Systems  DATA OBTAINED: from patient GENERAL:  no fevers, fatigue,  appetite changes SKIN: No itching, rash or wounds EYES: No eye pain, redness, discharge EARS: No earache, tinnitus, change in hearing NOSE: No congestion, drainage or bleeding  MOUTH/THROAT: No mouth or tooth pain, No sore throat RESPIRATORY: No cough, wheezing, +SOB CARDIAC: No chest pain, palpitations, lower extremity edema  GI: No abdominal pain, No N/V/D or constipation, No heartburn or reflux  GU: No dysuria, frequency or urgency, or incontinence  MUSCULOSKELETAL: No unrelieved bone/joint pain NEUROLOGIC: No headache, dizziness or focal weakness PSYCHIATRIC: No c/o anxiety or sadness   Filed Vitals:   09/22/15 1340  BP: 112/56  Pulse: 93  Temp: 98 F (36.7 C)  Resp: 18    SpO2 Readings from Last 1 Encounters:  09/30/15 97%        Physical Exam  GENERAL APPEARANCE: Alert, conversant,  No acute distress.  SKIN: No diaphoresis rash HEAD: Normocephalic, atraumatic  EYES: Conjunctiva/lids clear. Pupils round, reactive. EOMs intact.  EARS: External exam WNL, canals clear. Hearing grossly normal.  NOSE: No deformity or discharge.  MOUTH/THROAT: Lips w/o lesions  RESPIRATORY: Breathing is even, unlabored. Lung sounds are clear; wearing O2-sat 95%   CARDIOVASCULAR: Heart RRR no murmurs, rubs or gallops. No peripheral edema.   GASTROINTESTINAL: Abdomen is soft, non-tender, not distended w/ normal bowel sounds. GENITOURINARY: Bladder non tender, not distended  MUSCULOSKELETAL: No abnormal joints or musculature NEUROLOGIC:  Cranial nerves 2-12 grossly intact. Moves all extremities  PSYCHIATRIC: Mood and affect appropriate to situation, no behavioral issues  Patient Active Problem List   Diagnosis Date Noted  .  Benign paroxysmal positional vertigo 10/02/2015  . Acute on chronic congestive heart failure (HCC)   . CHF exacerbation (HCC) 09/27/2015  . Acute HF (heart failure) (HCC) 09/27/2015  . UTI (urinary tract infection) 09/22/2015  . Atrial fibrillation with RVR (HCC) 09/22/2015  . Acute on chronic combined systolic (congestive) and diastolic (congestive) heart failure (HCC) 09/21/2015  . DNR (do not resuscitate)   . Congestive dilated cardiomyopathy (HCC) 09/03/2015  . CKD (chronic kidney disease) stage 3, GFR 30-59 ml/min 07/24/2015  . Hot flashes 06/17/2015  . Cough 05/24/2015  . Syncope 11/30/2014  . FTT (failure to thrive) in adult 09/29/2014  . Palliative care encounter   . Shortness of breath   . Symptomatic bradycardia 09/24/2014  . Acute renal failure superimposed on stage 4 chronic kidney disease (HCC) 01/09/2014  . Edema 09/03/2013  . Anemia 09/03/2013  . Depression 08/13/2013  . Vitamin D deficiency 08/13/2013  . Paroxysmal atrial fibrillation (HCC) 10/16/2012  . Hypertensive heart disease with CHF (congestive heart failure) (HCC) 10/16/2012  . Chronic combined systolic and diastolic CHF (congestive heart failure) (HCC) 10/16/2012  . Unspecified constipation 10/16/2012  . Osteoarthritis 10/16/2012  . Other malaise and fatigue 10/16/2012  . Incomplete tear of rotator cuff 09/30/2012       Component Value Date/Time   WBC 7.7 09/29/2015 0503   WBC 7.7 09/29/2015   RBC 3.60* 09/29/2015 0503   HGB 10.3* 09/29/2015 0503   HCT 32.2* 09/29/2015 0503   PLT 284 09/29/2015 0503   MCV 89.4 09/29/2015 0503   LYMPHSABS 2.9 09/27/2015 1754   MONOABS 0.8 09/27/2015 1754   EOSABS 0.2 09/27/2015 1754   BASOSABS 0.0 09/27/2015 1754        Component Value Date/Time   NA 138 09/30/2015 0501   NA 138 09/30/2015   K 4.3 09/30/2015 0501   CL 104 09/30/2015 0501   CO2 28 09/30/2015 0501   GLUCOSE 92 09/30/2015 0501  BUN 42* 09/30/2015 0501   BUN 42* 09/30/2015   CREATININE  1.94* 09/30/2015 0501   CREATININE 1.9* 09/30/2015   CALCIUM 9.3 09/30/2015 0501   PROT 7.1 09/24/2014 0550   ALBUMIN 3.3* 09/24/2014 0550   AST 11* 08/09/2015   ALT 10 08/09/2015   ALKPHOS 78 08/09/2015   BILITOT 1.2 09/24/2014 0550   GFRNONAA 22* 09/30/2015 0501   GFRAA 25* 09/30/2015 0501    No results found for: HGBA1C  No results found for: CHOL, HDL, LDLCALC, LDLDIRECT, TRIG, CHOLHDL   Dg Chest 2 View  09/20/2015  CLINICAL DATA:  Shortness of breath tonight. No chest pain. History of CHF EXAM: CHEST  2 VIEW COMPARISON:  09/23/2014 FINDINGS: Cardiac enlargement without vascular congestion. Since the previous study, there is developing bilateral pleural effusions, greater on the left, with infiltration or atelectasis in the left lung base. Changes could represent fluid overload. Superimposed pneumonia on the left is not excluded. No pneumothorax. Mediastinal contours appear intact. Degenerative changes in the spine and shoulders. IMPRESSION: Developing bilateral pleural effusions, greater on the left, with infiltration or atelectasis in the left lung base. Cardiac enlargement. Electronically Signed   By: Burman Nieves M.D.   On: 09/20/2015 23:39    Not all labs, radiology exams or other studies done during hospitalization come through on my EPIC note; however they are reviewed by me.    Assessment and Plan  Acute on chronic combined systolic (congestive) and diastolic (congestive) heart failure (HCC) triggered by rapid atrial fibrillation in the setting of congestive dilated cardiomyopathy - Appears to have been triggered by rapid A. Fib. Amiodarone started by cardiologist. Bonnee Quin of beta blockers. - Last 2 D ECHO in 09/2014 with EF 20% and grade 1 diastolic dysfunction. - BNP on this admission 2163. - Pt on IV lasix 40 mg BID, resume Bumex at D/C. SNF - restart demadex 20 mg BID, twice weekly weights; pt started on amiodarone for AF; cont anticoag with xarelto 15 mg  daily   UTI - Treated with Rocephin,  SNF -  Macrobid x 3 more days. Cultures not sent.   HTN SNF - BP controlled on lisinopril 2.5 mg and demadex 20 mg BId; cont same   CKD (chronic kidney disease) stage 3, GFR 30-59 ml/min - Creatinine stable at D/C. SNF - will f/u BMP  ANEMIA SNF - cont iron 325 mg daily  VERTIGO SNF - cont antivert prn   Time spent > 35 min;> 50% of time with patient was spent reviewing records, labs, tests and studies, counseling and developing plan of care  Thurston Hole D. Lyn Hollingshead, MD

## 2015-09-24 ENCOUNTER — Non-Acute Institutional Stay (SKILLED_NURSING_FACILITY): Payer: Medicare Other | Admitting: Internal Medicine

## 2015-09-24 ENCOUNTER — Encounter: Payer: Self-pay | Admitting: Internal Medicine

## 2015-09-24 DIAGNOSIS — Z7189 Other specified counseling: Secondary | ICD-10-CM | POA: Diagnosis not present

## 2015-09-24 NOTE — Progress Notes (Signed)
MRN: [id number] Name: Kara Wilkerson  Sex: female Age: 82 y.o. DOB: July 17, 1922  PSC #:  Facility/Room: [facility] / [address] Level Of Care: SNF Provider: Randon Goldsmith. Lyn Hollingshead, MD Emergency Contacts: Extended Emergency Contact Information Primary Emergency Contact: Huntely,Douschkas Address: [address] [place], [place] [address] D[place] of [place] Home Phone: [phone] Relation: Niece Secondary Emergency Contact: [facility] Address: [address] [place], [place] [address] [place] of [place] Phone: [phone] Relation: Other  Code Status: DNR  Allergies: Codeine  Chief Complaint  Patient presents with  . Acute Visit    Acute  . encounter for family conference with    HPI: Patient is 82 y.o. female whose daughter wishes to speak to me about her mother's condition. Pt was just admitted to hospital for CHF which is endstage.   Past Medical History  Diagnosis Date  . CHF (congestive heart failure) (HCC)   . A-fib (HCC)   . Hypertension   . Constipation   . Arthritis   . Chronic systolic heart failure (HCC)   . Dependence on supplemental oxygen   . Other chronic pain   . Contracture of ankle and foot joint   . Muscle weakness (generalized)   . Lack of coordination   . Unspecified essential hypertension   . Edema   . GERD (gastroesophageal reflux disease)   . Hyperlipidemia   . Depression   . Chronic kidney disease     Stage III-IV    Past Surgical History  Procedure Laterality Date  . Right ankle orif    . Fracture surgery        Medication List       This list is accurate as of: 09/24/15 11:59 PM.  Always use your most recent med list.               acetaminophen 500 MG tablet  Commonly known as:  TYLENOL  Take 1,000 mg by mouth 2 (two) times daily.     amiodarone 200 MG tablet  Commonly known as:  PACERONE  Take 1 tablet (200 mg total) by mouth 2 (two) times daily.     bisacodyl 10  MG suppository  Commonly known as:  DULCOLAX  Place 10 mg rectally daily as needed for moderate constipation.     calcium-vitamin D 500-200 MG-UNIT tablet  Commonly known as:  OSCAL WITH D  Take 1 tablet by mouth 2 (two) times daily.     diclofenac sodium 1 % Gel  Commonly known as:  VOLTAREN  Apply 4 g topically 2 (two) times daily. To both knees     feeding supplement (ENSURE ENLIVE) Liqd  Take 237 mLs by mouth daily.     ferrous sulfate 325 (65 FE) MG tablet  Take 325 mg by mouth daily with breakfast.     fluticasone 50 MCG/ACT nasal spray  Commonly known as:  FLONASE  Place 2 sprays into the nose daily. For allergies     glucosamine-chondroitin 500-400 MG tablet  Take 2 tablets by mouth 2 (two) times daily. For osteoarthritis     guaiFENesin 100 MG/5ML Soln  Commonly known as:  ROBITUSSIN  Take 10 mLs by mouth every 6 (six) hours as needed for cough or to loosen phlegm.     lidocaine 5 %  Commonly known as:  LIDODERM  Place 1 patch onto the skin daily. Remove & Discard  patch within 12 hours to left shoulder     lisinopril 2.5 MG tablet  Commonly known as:  PRINIVIL,ZESTRIL  Take 2.5 mg by mouth daily. Hold for SBP less than 100.     magnesium hydroxide 400 MG/5ML suspension  Commonly known as:  MILK OF MAGNESIA  Take 30 mLs by mouth daily as needed for mild constipation or moderate constipation.     meclizine 12.5 MG tablet  Commonly known as:  ANTIVERT  Take 12.5 mg by mouth 2 (two) times daily as needed for dizziness. For dizziness     nitrofurantoin (macrocrystal-monohydrate) 100 MG capsule  Commonly known as:  MACROBID  Take 1 capsule (100 mg total) by mouth 2 (two) times daily.     oxyCODONE 5 MG immediate release tablet  Commonly known as:  Oxy IR/ROXICODONE  Take one tablet by mouth every four hours as needed for pain     polyethylene glycol packet  Commonly known as:  MIRALAX / GLYCOLAX  Take 17 g by mouth daily. For constipation     potassium  chloride 20 MEQ packet  Commonly known as:  KLOR-CON  Take 40 mEq by mouth daily.     RA SALINE ENEMA RE  Place 1 Applicatorful rectally daily as needed (constipation).     Rivaroxaban 15 MG Tabs tablet  Commonly known as:  XARELTO  Take 15 mg by mouth daily with supper. Give 1 tablet by mouth daily after evening meal for A-Fib.     sennosides-docusate sodium 8.6-50 MG tablet  Commonly known as:  SENOKOT-S  Take 2 tablets by mouth at bedtime.     sertraline 50 MG tablet  Commonly known as:  ZOLOFT  Take 50 mg by mouth daily.     SYSTANE 0.4-0.3 % Soln  Generic drug:  Polyethyl Glycol-Propyl Glycol  Apply 1 drop to eye daily. Administer one drop in to each eye once daily for eye irritation.     torsemide 20 MG tablet  Commonly known as:  DEMADEX  Take 20 mg by mouth 2 (two) times daily. For congestive heart failure        Meds ordered this encounter  Medications  . potassium chloride (KLOR-CON) 20 MEQ packet    Sig: Take 40 mEq by mouth daily.    Immunization History  Administered Date(s) Administered  . Influenza Whole 01/22/2013  . Influenza-Unspecified 01/11/2012, 01/20/2014, 01/04/2015  . PPD Test 11/20/2011    Social History  Substance Use Topics  . Smoking status: Never Smoker   . Smokeless tobacco: Never Used  . Alcohol Use: No    Review of Systems  DATA OBTAINED: from patient, nurse GENERAL:  no fevers, fatigue, appetite changes SKIN: No itching, rash HEENT: No complaint RESPIRATORY: No cough, wheezing,+ SOB CARDIAC: No chest pain, palpitations, lower extremity edema  GI: No abdominal pain, No N/V/D or constipation, No heartburn or reflux  GU: No dysuria, frequency or urgency, or incontinence  MUSCULOSKELETAL: No unrelieved bone/joint pain NEUROLOGIC: No headache, dizziness  PSYCHIATRIC: No overt anxiety or sadness  Filed Vitals:   09/24/15 1409  BP: 105/74  Pulse: 93  Temp: 98.6 F (37 C)  Resp: 18    Physical Exam  GENERAL  APPEARANCE: Alert, conversant, No acute distress  SKIN: No diaphoresis rash HEENT: Unremarkable RESPIRATORY: Breathing is even, unlabored. Lung sounds are clear   CARDIOVASCULAR: Heart RRR no murmurs, rubs or gallops. No peripheral edema  GASTROINTESTINAL: Abdomen is soft, non-tender, not distended w/ normal bowel sounds.  GENITOURINARY: Bladder  non tender, not distended  MUSCULOSKELETAL: No abnormal joints or musculature NEUROLOGIC: Cranial nerves 2-12 grossly intact. Moves all extremities PSYCHIATRIC: Mood and affect appropriate to situation, no behavioral issues  Patient Active Problem List   Diagnosis Date Noted  . Benign paroxysmal positional vertigo 10/02/2015  . Encounter for family conference with patient present 10/02/2015  . Acute on chronic congestive heart failure (HCC)   . CHF exacerbation (HCC) 09/27/2015  . Acute HF (heart failure) (HCC) 09/27/2015  . UTI (urinary tract infection) 09/22/2015  . Atrial fibrillation with RVR (HCC) 09/22/2015  . Acute on chronic combined systolic (congestive) and diastolic (congestive) heart failure (HCC) 09/21/2015  . DNR (do not resuscitate)   . Congestive dilated cardiomyopathy (HCC) 09/03/2015  . CKD (chronic kidney disease) stage 3, GFR 30-59 ml/min 07/24/2015  . Hot flashes 06/17/2015  . Cough 05/24/2015  . Syncope 11/30/2014  . FTT (failure to thrive) in adult 09/29/2014  . Palliative care encounter   . Shortness of breath   . Symptomatic bradycardia 09/24/2014  . Acute renal failure superimposed on stage 4 chronic kidney disease (HCC) 01/09/2014  . Edema 09/03/2013  . Anemia 09/03/2013  . Depression 08/13/2013  . Vitamin D deficiency 08/13/2013  . Paroxysmal atrial fibrillation (HCC) 10/16/2012  . Hypertensive heart disease with CHF (congestive heart failure) (HCC) 10/16/2012  . Chronic combined systolic and diastolic CHF (congestive heart failure) (HCC) 10/16/2012  . Unspecified constipation 10/16/2012  . Osteoarthritis  10/16/2012  . Other malaise and fatigue 10/16/2012  . Incomplete tear of rotator cuff 09/30/2012    CBC    Component Value Date/Time   WBC 7.7 09/29/2015 0503   WBC 7.7 09/29/2015   RBC 3.60* 09/29/2015 0503   HGB 10.3* 09/29/2015 0503   HCT 32.2* 09/29/2015 0503   PLT 284 09/29/2015 0503   MCV 89.4 09/29/2015 0503   LYMPHSABS 2.9 09/27/2015 1754   MONOABS 0.8 09/27/2015 1754   EOSABS 0.2 09/27/2015 1754   BASOSABS 0.0 09/27/2015 1754    CMP     Component Value Date/Time   NA 138 09/30/2015 0501   NA 138 09/30/2015   K 4.3 09/30/2015 0501   CL 104 09/30/2015 0501   CO2 28 09/30/2015 0501   GLUCOSE 92 09/30/2015 0501   BUN 42* 09/30/2015 0501   BUN 42* 09/30/2015   CREATININE 1.94* 09/30/2015 0501   CREATININE 1.9* 09/30/2015   CALCIUM 9.3 09/30/2015 0501   PROT 7.1 09/24/2014 0550   ALBUMIN 3.3* 09/24/2014 0550   AST 11* 08/09/2015   ALT 10 08/09/2015   ALKPHOS 78 08/09/2015   BILITOT 1.2 09/24/2014 0550   GFRNONAA 22* 09/30/2015 0501   GFRAA 25* 09/30/2015 0501    Assessment and Plan  Encounter for family conference with patient present Spoke with pt's niece , who is POA. I confirmed that pt's CHF was end stage, has been for a year but she has done so well until now. We discussed keeping her spirits up. Niece would like Hospice to follow, but she has prepared her for this. Numerous points were touched on as well.   Time spent > 35 min;> 50% of time with patient was spent reviewing records, labs, tests and studies, counseling and developing plan of care  Randon Goldsmith. Lyn Hollingshead, MD

## 2015-09-25 ENCOUNTER — Encounter: Payer: Self-pay | Admitting: Internal Medicine

## 2015-09-27 ENCOUNTER — Encounter: Payer: Self-pay | Admitting: Internal Medicine

## 2015-09-27 ENCOUNTER — Other Ambulatory Visit: Payer: Self-pay

## 2015-09-27 ENCOUNTER — Emergency Department (HOSPITAL_COMMUNITY)

## 2015-09-27 ENCOUNTER — Inpatient Hospital Stay (HOSPITAL_COMMUNITY)
Admission: EM | Admit: 2015-09-27 | Discharge: 2015-09-30 | DRG: 291 | Disposition: A | Discharge status: Hospice | Attending: Internal Medicine | Admitting: Internal Medicine

## 2015-09-27 ENCOUNTER — Encounter (HOSPITAL_COMMUNITY): Payer: Self-pay | Admitting: Emergency Medicine

## 2015-09-27 ENCOUNTER — Non-Acute Institutional Stay (SKILLED_NURSING_FACILITY): Payer: Medicare Other | Admitting: Internal Medicine

## 2015-09-27 DIAGNOSIS — Z833 Family history of diabetes mellitus: Secondary | ICD-10-CM

## 2015-09-27 DIAGNOSIS — D649 Anemia, unspecified: Secondary | ICD-10-CM | POA: Diagnosis present

## 2015-09-27 DIAGNOSIS — N183 Chronic kidney disease, stage 3 unspecified: Secondary | ICD-10-CM | POA: Diagnosis present

## 2015-09-27 DIAGNOSIS — F411 Generalized anxiety disorder: Secondary | ICD-10-CM | POA: Diagnosis not present

## 2015-09-27 DIAGNOSIS — I5041 Acute combined systolic (congestive) and diastolic (congestive) heart failure: Secondary | ICD-10-CM | POA: Diagnosis not present

## 2015-09-27 DIAGNOSIS — Z66 Do not resuscitate: Secondary | ICD-10-CM | POA: Diagnosis present

## 2015-09-27 DIAGNOSIS — I130 Hypertensive heart and chronic kidney disease with heart failure and stage 1 through stage 4 chronic kidney disease, or unspecified chronic kidney disease: Secondary | ICD-10-CM | POA: Diagnosis present

## 2015-09-27 DIAGNOSIS — K5900 Constipation, unspecified: Secondary | ICD-10-CM | POA: Diagnosis present

## 2015-09-27 DIAGNOSIS — I509 Heart failure, unspecified: Secondary | ICD-10-CM | POA: Diagnosis not present

## 2015-09-27 DIAGNOSIS — F329 Major depressive disorder, single episode, unspecified: Secondary | ICD-10-CM | POA: Diagnosis present

## 2015-09-27 DIAGNOSIS — I5043 Acute on chronic combined systolic (congestive) and diastolic (congestive) heart failure: Secondary | ICD-10-CM

## 2015-09-27 DIAGNOSIS — J9811 Atelectasis: Secondary | ICD-10-CM | POA: Diagnosis present

## 2015-09-27 DIAGNOSIS — Z79899 Other long term (current) drug therapy: Secondary | ICD-10-CM | POA: Diagnosis not present

## 2015-09-27 DIAGNOSIS — Z9981 Dependence on supplemental oxygen: Secondary | ICD-10-CM

## 2015-09-27 DIAGNOSIS — Z79891 Long term (current) use of opiate analgesic: Secondary | ICD-10-CM

## 2015-09-27 DIAGNOSIS — I2510 Atherosclerotic heart disease of native coronary artery without angina pectoris: Secondary | ICD-10-CM | POA: Diagnosis present

## 2015-09-27 DIAGNOSIS — Z515 Encounter for palliative care: Secondary | ICD-10-CM | POA: Diagnosis present

## 2015-09-27 DIAGNOSIS — I482 Chronic atrial fibrillation: Secondary | ICD-10-CM | POA: Diagnosis not present

## 2015-09-27 DIAGNOSIS — Z7901 Long term (current) use of anticoagulants: Secondary | ICD-10-CM | POA: Diagnosis not present

## 2015-09-27 DIAGNOSIS — Z8249 Family history of ischemic heart disease and other diseases of the circulatory system: Secondary | ICD-10-CM

## 2015-09-27 DIAGNOSIS — N184 Chronic kidney disease, stage 4 (severe): Secondary | ICD-10-CM | POA: Diagnosis present

## 2015-09-27 DIAGNOSIS — E785 Hyperlipidemia, unspecified: Secondary | ICD-10-CM | POA: Diagnosis present

## 2015-09-27 DIAGNOSIS — I480 Paroxysmal atrial fibrillation: Secondary | ICD-10-CM | POA: Diagnosis not present

## 2015-09-27 DIAGNOSIS — K219 Gastro-esophageal reflux disease without esophagitis: Secondary | ICD-10-CM | POA: Diagnosis present

## 2015-09-27 DIAGNOSIS — R0602 Shortness of breath: Secondary | ICD-10-CM

## 2015-09-27 DIAGNOSIS — J90 Pleural effusion, not elsewhere classified: Secondary | ICD-10-CM

## 2015-09-27 DIAGNOSIS — I5021 Acute systolic (congestive) heart failure: Secondary | ICD-10-CM | POA: Diagnosis not present

## 2015-09-27 DIAGNOSIS — R0600 Dyspnea, unspecified: Secondary | ICD-10-CM | POA: Diagnosis present

## 2015-09-27 LAB — CBC WITH DIFFERENTIAL/PLATELET
BASOS ABS: 0 10*3/uL (ref 0.0–0.1)
Basophils Relative: 0 %
EOS ABS: 0.2 10*3/uL (ref 0.0–0.7)
EOS PCT: 2 %
HCT: 34 % — ABNORMAL LOW (ref 36.0–46.0)
Hemoglobin: 10.6 g/dL — ABNORMAL LOW (ref 12.0–15.0)
LYMPHS PCT: 29 %
Lymphs Abs: 2.9 10*3/uL (ref 0.7–4.0)
MCH: 27.7 pg (ref 26.0–34.0)
MCHC: 31.2 g/dL (ref 30.0–36.0)
MCV: 89 fL (ref 78.0–100.0)
Monocytes Absolute: 0.8 10*3/uL (ref 0.1–1.0)
Monocytes Relative: 8 %
Neutro Abs: 6.1 10*3/uL (ref 1.7–7.7)
Neutrophils Relative %: 61 %
PLATELETS: 307 10*3/uL (ref 150–400)
RBC: 3.82 MIL/uL — AB (ref 3.87–5.11)
RDW: 17.6 % — ABNORMAL HIGH (ref 11.5–15.5)
WBC: 9.9 10*3/uL (ref 4.0–10.5)

## 2015-09-27 LAB — BASIC METABOLIC PANEL
Anion gap: 8 (ref 5–15)
BUN: 35 mg/dL — AB (ref 6–20)
CALCIUM: 9.6 mg/dL (ref 8.9–10.3)
CHLORIDE: 103 mmol/L (ref 101–111)
CO2: 27 mmol/L (ref 22–32)
CREATININE: 1.73 mg/dL — AB (ref 0.44–1.00)
GFR calc non Af Amer: 25 mL/min — ABNORMAL LOW (ref >60)
GFR, EST AFRICAN AMERICAN: 29 mL/min — AB (ref >60)
Glucose, Bld: 103 mg/dL — ABNORMAL HIGH (ref 65–99)
Potassium: 4.3 mmol/L (ref 3.5–5.1)
SODIUM: 138 mmol/L (ref 135–145)

## 2015-09-27 LAB — BRAIN NATRIURETIC PEPTIDE: B Natriuretic Peptide: 2201.4 pg/mL — ABNORMAL HIGH (ref 0.0–100.0)

## 2015-09-27 LAB — TROPONIN I: Troponin I: 0.05 ng/mL — ABNORMAL HIGH (ref <0.031)

## 2015-09-27 MED ORDER — SENNA 8.6 MG PO TABS
2.0000 | ORAL_TABLET | Freq: Every day | ORAL | Status: DC
  Administered 2015-09-27 – 2015-09-29 (×3): 17.2 mg via ORAL
  Filled 2015-09-27 (×3): qty 2

## 2015-09-27 MED ORDER — BISACODYL 10 MG RE SUPP: 10.0000 mg | Freq: Every day | RECTAL | Status: DC | PRN

## 2015-09-27 MED ORDER — ACETAMINOPHEN 325 MG PO TABS: 650.0000 mg | ORAL_TABLET | ORAL | Status: DC | PRN

## 2015-09-27 MED ORDER — FUROSEMIDE 40 MG PO TABS
60.0000 mg | ORAL_TABLET | Freq: Two times a day (BID) | ORAL | Status: DC
  Administered 2015-09-28 – 2015-09-30 (×5): 60 mg via ORAL
  Filled 2015-09-27 (×5): qty 1

## 2015-09-27 MED ORDER — ONDANSETRON HCL 4 MG/2ML IJ SOLN
4.0000 mg | Freq: Four times a day (QID) | INTRAMUSCULAR | Status: DC | PRN
  Administered 2015-09-28: 4 mg via INTRAVENOUS
  Filled 2015-09-27: qty 2

## 2015-09-27 MED ORDER — SERTRALINE HCL 50 MG PO TABS
150.0000 mg | ORAL_TABLET | Freq: Every day | ORAL | Status: DC
  Administered 2015-09-28 – 2015-09-30 (×3): 150 mg via ORAL
  Filled 2015-09-27 (×3): qty 1

## 2015-09-27 MED ORDER — POLYVINYL ALCOHOL 1.4 % OP SOLN
1.0000 [drp] | Freq: Every day | OPHTHALMIC | Status: DC
  Administered 2015-09-28 – 2015-09-29 (×2): 1 [drp] via OPHTHALMIC
  Filled 2015-09-27: qty 15

## 2015-09-27 MED ORDER — ACETAMINOPHEN 500 MG PO TABS
1000.0000 mg | ORAL_TABLET | Freq: Two times a day (BID) | ORAL | Status: DC
  Administered 2015-09-27 – 2015-09-30 (×6): 1000 mg via ORAL
  Filled 2015-09-27 (×6): qty 2

## 2015-09-27 MED ORDER — LISINOPRIL 2.5 MG PO TABS
2.5000 mg | ORAL_TABLET | Freq: Every day | ORAL | Status: DC
  Administered 2015-09-28 – 2015-09-30 (×3): 2.5 mg via ORAL
  Filled 2015-09-27 (×3): qty 1

## 2015-09-27 MED ORDER — FUROSEMIDE 10 MG/ML IJ SOLN
80.0000 mg | Freq: Once | INTRAMUSCULAR | Status: AC
  Administered 2015-09-27: 80 mg via INTRAVENOUS
  Filled 2015-09-27: qty 8

## 2015-09-27 MED ORDER — SODIUM CHLORIDE 0.9% FLUSH: 3.0000 mL | INTRAVENOUS | Status: DC | PRN

## 2015-09-27 MED ORDER — AMIODARONE HCL 200 MG PO TABS
200.0000 mg | ORAL_TABLET | Freq: Two times a day (BID) | ORAL | Status: DC
  Administered 2015-09-27 – 2015-09-30 (×6): 200 mg via ORAL
  Filled 2015-09-27 (×6): qty 1

## 2015-09-27 MED ORDER — SODIUM CHLORIDE 0.9 % IV SOLN: 250.0000 mL | INTRAVENOUS | Status: DC | PRN

## 2015-09-27 MED ORDER — FERROUS SULFATE 325 (65 FE) MG PO TABS
325.0000 mg | ORAL_TABLET | Freq: Every day | ORAL | Status: DC
  Administered 2015-09-28 – 2015-09-30 (×3): 325 mg via ORAL
  Filled 2015-09-27 (×3): qty 1

## 2015-09-27 MED ORDER — SODIUM CHLORIDE 0.9% FLUSH
3.0000 mL | Freq: Two times a day (BID) | INTRAVENOUS | Status: DC
  Administered 2015-09-27 – 2015-09-29 (×5): 3 mL via INTRAVENOUS

## 2015-09-27 MED ORDER — POLYETHYLENE GLYCOL 3350 17 G PO PACK
17.0000 g | PACK | Freq: Every day | ORAL | Status: DC
  Administered 2015-09-28 – 2015-09-30 (×3): 17 g via ORAL
  Filled 2015-09-27 (×3): qty 1

## 2015-09-27 MED ORDER — ENSURE ENLIVE PO LIQD
237.0000 mL | ORAL | Status: DC
  Administered 2015-09-27 – 2015-09-29 (×2): 237 mL via ORAL

## 2015-09-27 MED ORDER — MAGNESIUM HYDROXIDE 400 MG/5ML PO SUSP: 30.0000 mL | Freq: Every day | ORAL | Status: DC | PRN

## 2015-09-27 MED ORDER — GLUCOSAMINE-CHONDROITIN 500-400 MG PO TABS: 2.0000 | ORAL_TABLET | Freq: Two times a day (BID) | ORAL | Status: DC

## 2015-09-27 MED ORDER — MECLIZINE HCL 25 MG PO TABS
12.5000 mg | ORAL_TABLET | Freq: Two times a day (BID) | ORAL | Status: DC | PRN
  Administered 2015-09-27 – 2015-09-28 (×2): 12.5 mg via ORAL
  Filled 2015-09-27 (×3): qty 1

## 2015-09-27 MED ORDER — OXYCODONE HCL 5 MG PO TABS: 5.0000 mg | ORAL_TABLET | ORAL | Status: DC | PRN

## 2015-09-27 MED ORDER — RIVAROXABAN 15 MG PO TABS
15.0000 mg | ORAL_TABLET | Freq: Every day | ORAL | Status: DC
  Administered 2015-09-27 – 2015-09-29 (×3): 15 mg via ORAL
  Filled 2015-09-27 (×3): qty 1

## 2015-09-27 MED ORDER — POTASSIUM CHLORIDE 20 MEQ/15ML (10%) PO SOLN
40.0000 meq | Freq: Every day | ORAL | Status: DC
  Administered 2015-09-28 – 2015-09-30 (×3): 40 meq via ORAL
  Filled 2015-09-27 (×6): qty 30

## 2015-09-27 MED ORDER — POLYETHYL GLYCOL-PROPYL GLYCOL 0.4-0.3 % OP SOLN: 1.0000 [drp] | Freq: Every day | OPHTHALMIC | Status: DC

## 2015-09-27 MED ORDER — GUAIFENESIN 100 MG/5ML PO SOLN: 10.0000 mL | Freq: Four times a day (QID) | ORAL | Status: DC | PRN

## 2015-09-27 MED ORDER — CALCIUM CARBONATE-VITAMIN D 500-200 MG-UNIT PO TABS
1.0000 | ORAL_TABLET | Freq: Two times a day (BID) | ORAL | Status: DC
  Administered 2015-09-27 – 2015-09-30 (×6): 1 via ORAL
  Filled 2015-09-27 (×6): qty 1

## 2015-09-27 NOTE — Progress Notes (Signed)
PHARMACIST - PHYSICIAN ORDER COMMUNICATION  CONCERNING: P&T Medication Policy on Herbal Medications  DESCRIPTION:  This patient's order for:  Glucosamine-Chondroitin  has been noted.  This product(s) is classified as an "herbal" or natural product. Due to a lack of definitive safety studies or FDA approval, nonstandard manufacturing practices, plus the potential risk of unknown drug-drug interactions while on inpatient medications, the Pharmacy and Therapeutics Committee does not permit the use of "herbal" or natural products of this type within [place].   ACTION TAKEN: The pharmacy department is unable to verify this order at this time and your patient has been informed of this safety policy. Please reevaluate patient's clinical condition at discharge and address if the herbal or natural product(s) should be resumed at that time.   

## 2015-09-27 NOTE — ED Notes (Signed)
Bed: WA17 Expected date:  Expected time:  Means of arrival:  Comments: EMS 

## 2015-09-27 NOTE — H&P (Signed)
History and Physical  Kara Wilkerson ZOX:[id number] DOB: [birth year] DOA: 09/27/2015  PCP:  Margit Hanks, MD   Chief Complaint:  Dyspnea   History of Present Illness:  Patient is a 82 yo female with history of CHF, HTN, CKD, Afib who came with cc of dyspnea for a few weeks but worsening over the past two days. She also has orthopnea, PND and LE edema. She was discharged last week after admission with similar symptoms for acute HF. She denied any chest pain. She had couple of episodes of vomiting two days ago but none afterwards. She has no nausea or abdominal pain today. No fever/chills or cough. No other complaints.   Review of Systems:  CONSTITUTIONAL:     No night sweats.  +fatigue.  No fever. No chills. Eyes:                            No visual changes.  No eye pain.  No eye discharge.   ENT:                              No epistaxis.  No sinus pain.  No sore throat.   No congestion. RESPIRATORY:           No cough.  No wheeze.  No hemoptysis.  +dyspnea CARDIOVASCULAR   :  No chest pains.  No palpitations. GASTROINTESTINAL:  No abdominal pain.  No nausea. No vomiting.  No diarrhea. No constipation.  No hematemesis.  No hematochezia.  No melena. GENITOURINARY:      No urgency.  No frequency.  No dysuria.  No hematuria.  No  obstructive symptoms.  No discharge.  No pain.   MUSCULOSKELETAL:  No musculoskeletal pain.  No joint swelling.  No arthritis. NEUROLOGICAL:        No confusion.  No weakness. No headache. No seizure. PSYCHIATRIC:             No depression. No anxiety. No suicidal ideation. SKIN:                             No rashes.  No lesions.  No wounds. ENDOCRINE:                No weight loss.  No polydipsia.  No polyuria.  No polyphagia. HEMATOLOGIC:           No purpura.  No petechiae.  No bleeding.  ALLERGIC                 : No pruritus.  No angioedema Other:  Past Medical and Surgical History:   Past Medical History  Diagnosis Date  . CHF (congestive  heart failure) (HCC)   . A-fib (HCC)   . Hypertension   . Constipation   . Arthritis   . Chronic systolic heart failure (HCC)   . Dependence on supplemental oxygen   . Other chronic pain   . Contracture of ankle and foot joint   . Muscle weakness (generalized)   . Lack of coordination   . Unspecified essential hypertension   . Edema   . GERD (gastroesophageal reflux disease)   . Hyperlipidemia    Past Surgical History  Procedure Laterality Date  . Right ankle orif    . Fracture surgery      Social History:   reports that she  has never smoked. She has never used smokeless tobacco. She reports that she does not drink alcohol or use illicit drugs.   Allergies  Allergen Reactions  . Codeine Other (See Comments)    Unk    Family History  Problem Relation Age of Onset  . Diabetes Mother   . Hypertension Mother   . Cancer Brother       Prior to Admission medications   Medication Sig Start Date End Date Taking? Authorizing Provider  acetaminophen (TYLENOL) 500 MG tablet Take 1,000 mg by mouth 2 (two) times daily.   Yes Historical Provider, MD  amiodarone (PACERONE) 200 MG tablet Take 1 tablet (200 mg total) by mouth 2 (two) times daily. 09/22/15  Yes Christina P Rama, MD  bisacodyl (DULCOLAX) 10 MG suppository Place 10 mg rectally daily as needed for moderate constipation.   Yes Historical Provider, MD  calcium-vitamin D (OSCAL WITH D) 500-200 MG-UNIT tablet Take 1 tablet by mouth 2 (two) times daily.   Yes Historical Provider, MD  diclofenac sodium (VOLTAREN) 1 % GEL Apply 4 g topically 2 (two) times daily. To both knees   Yes Historical Provider, MD  feeding supplement, ENSURE ENLIVE, (ENSURE ENLIVE) LIQD Take 237 mLs by mouth daily. 09/25/14  Yes Yates Decamp, MD  ferrous sulfate 325 (65 FE) MG tablet Take 325 mg by mouth daily with breakfast.   Yes Historical Provider, MD  fluticasone (FLONASE) 50 MCG/ACT nasal spray Place 2 sprays into the nose daily. For allergies   Yes  Historical Provider, MD  glucosamine-chondroitin 500-400 MG tablet Take 2 tablets by mouth 2 (two) times daily. For osteoarthritis   Yes Historical Provider, MD  guaiFENesin (ROBITUSSIN) 100 MG/5ML SOLN Take 10 mLs by mouth every 6 (six) hours as needed for cough or to loosen phlegm.   Yes Historical Provider, MD  lidocaine (LIDODERM) 5 % Place 1 patch onto the skin daily. Remove & Discard patch within 12 hours to left shoulder   Yes Historical Provider, MD  lisinopril (PRINIVIL,ZESTRIL) 2.5 MG tablet Take 2.5 mg by mouth daily. Hold for SBP less than 100.   Yes Historical Provider, MD  magnesium hydroxide (MILK OF MAGNESIA) 400 MG/5ML suspension Take 30 mLs by mouth daily as needed for mild constipation or moderate constipation.    Yes Historical Provider, MD  meclizine (ANTIVERT) 12.5 MG tablet Take 12.5 mg by mouth 2 (two) times daily as needed for dizziness. For dizziness   Yes Historical Provider, MD  oxyCODONE (OXY IR/ROXICODONE) 5 MG immediate release tablet Take one tablet by mouth every four hours as needed for pain 09/22/15  Yes Maryruth Bun Rama, MD  Polyethyl Glycol-Propyl Glycol (SYSTANE) 0.4-0.3 % SOLN Apply 1 drop to eye daily. Administer one drop in to each eye once daily for eye irritation.   Yes Historical Provider, MD  polyethylene glycol (MIRALAX / GLYCOLAX) packet Take 17 g by mouth daily. For constipation   Yes Historical Provider, MD  potassium chloride (KLOR-CON) 20 MEQ packet Take 40 mEq by mouth daily.   Yes Historical Provider, MD  Rivaroxaban (XARELTO) 15 MG TABS tablet Take 15 mg by mouth daily with supper. Give 1 tablet by mouth daily after evening meal for A-Fib.   Yes Historical Provider, MD  senna (SENOKOT) 8.6 MG TABS tablet Take 2 tablets by mouth at bedtime.   Yes Historical Provider, MD  sertraline (ZOLOFT) 100 MG tablet Take 100 mg by mouth daily.   Yes Historical Provider, MD  sertraline (ZOLOFT) 50 MG tablet  Take 50 mg by mouth daily.    Yes Historical Provider, MD   Sodium Phosphates (RA SALINE ENEMA RE) Place 1 Applicatorful rectally daily as needed (constipation).   Yes Historical Provider, MD  torsemide (DEMADEX) 20 MG tablet Take 20 mg by mouth 2 (two) times daily. For congestive heart failure 12/31/12  Yes Astrid Divine, NP    Physical Exam: BP 97/46 mmHg  Pulse 87  Temp(Src) 97.8 F (36.6 C) (Oral)  Resp 26  Ht  (1.6 m)  Wt 79.8 kg (175 lb 14.8 oz)  BMI 31.17 kg/m2  SpO2 100%  GENERAL :   Alert and cooperative, and appears to be in no acute distress. HEAD:           normocephalic. EARS:           hearing grossly intact. NOSE:           No nasal discharge. THROAT:     Oral cavity and pharynx normal.   NECK:          supple, non-tender.  CARDIAC:   Gallop S3 , irregular .   Vascular:     ++ peripheral edema.  LUNGS:       Bilateral crackles   ABDOMEN: Positive bowel sounds. Soft, nondistended, nontender. No guarding or rebound.      MSK:           No joint erythema or tenderness. Normal muscular development. EXT           : No significant deformity or joint abnormality. Neuro        : Alert, oriented to person, place, and time.                      SKIN:            No rash. No lesions. PSYCH:       No hallucination. Patient is not suicidal.          Labs on Admission:  Reviewed.   Radiological Exams on Admission: Dg Chest 2 View  09/27/2015  CLINICAL DATA:  Difficulty breathing/ dyspnea. Congestive heart failure. EXAM: CHEST  2 VIEW COMPARISON:  09/20/2015 FINDINGS: Moderate enlargement of the cardiopericardial silhouette with a large left pleural effusion. Left pleural effusion mildly worsened from prior. Trace right pleural effusion. Mild right perihilar atelectasis. Thoracic spondylosis. Severe degenerative glenohumeral arthropathy, left greater than right. Indistinct central pulmonary vasculature. IMPRESSION: 1. Large left pleural effusion with associated atelectasis. 2. Moderate cardiomegaly, there is potentially  underlying pulmonary venous hypertension. 3. Thoracic spondylosis. 4. Degenerative glenohumeral arthropathy bilaterally. Electronically Signed   By: Gaylyn Rong M.D.   On: 09/27/2015 18:34    EKG:  Independently reviewed. Old LBBB  Assessment/Plan  Acute on chronic hear failure: Systolic, EF 25% Ddx: possibly inadequate diuresis          Arrhythmia : will keep on tele         ACS: unlikely with trop elevation likely due to CHF.     Last 2 D ECHO in 09/2014 with EF 20% and grade 1 diastolic dysfunction. Keep on tele Diuretics: 80 mg lasix IV in the ER. Will start PO lasix 60 mg BID to gauge how much she will need at home.  BB: not given, acute decompensation  ACEI/ARBS: continue lisinopril  Monitor I/O & daily weights Fluid restriction Heart healthy diet  Patient is in hospice care per patient herself and per nurse via niece. She understands that  she wants to further testing or labs done and she just wanted to "breathe better".  I will continue to trend trops, therefore.    HTN: cont home meds  Atrial fibrillation/flutter: Likely related to underlying hypertensive heart disease and/or CAD. ChADSVasc:  > 2.  Anticoagulation: Xarelto  Rate/Rythm control: Rythem control on amiodarone     Input & Output: ordered  Lines & Tubes: PIV/foley DVT prophylaxis: on full dose AC GI prophylaxis: NA Consultants: NA Code Status: DNR/DNI ( hospice care); dc home after adjusting diuretics dose.  Family Communication: none at bedside  Disposition Plan: admit to tele.     Eston Esters M.D [facility]

## 2015-09-27 NOTE — ED Notes (Signed)
Dr. Criss Alvine notified that the patient's heart rate going up to 150's and then back to the 80's and 90's

## 2015-09-27 NOTE — ED Provider Notes (Signed)
CSN: [id number]     Arrival date & time 09/27/15  1637 History   First MD Initiated Contact with Patient 09/27/15 1721     Chief Complaint  Patient presents with  . Shortness of Breath     (Consider location/radiation/quality/duration/timing/severity/associated sxs/prior Treatment) HPI  82 year old female with a history of CHF presents with dyspnea since last night. Patient was recently admitted and discharged for fluid overload. Patient say she's been having leg swelling but no worse than typical. Has been having shortness of breath since last night, especially when lying flat or moving around. No chest pain. She's been taking all the medicines I give her in her assisted living facility but she's not injected sure what they are. Has a dry cough for "a while" but no productive sputum or fevers.  Past Medical History  Diagnosis Date  . CHF (congestive heart failure) (HCC)   . A-fib (HCC)   . Hypertension   . Constipation   . Arthritis   . Chronic systolic heart failure (HCC)   . Dependence on supplemental oxygen   . Other chronic pain   . Contracture of ankle and foot joint   . Muscle weakness (generalized)   . Lack of coordination   . Unspecified essential hypertension   . Edema   . GERD (gastroesophageal reflux disease)   . Hyperlipidemia    Past Surgical History  Procedure Laterality Date  . Right ankle orif    . Fracture surgery     Family History  Problem Relation Age of Onset  . Diabetes Mother   . Hypertension Mother   . Cancer Brother    Social History  Substance Use Topics  . Smoking status: Never Smoker   . Smokeless tobacco: Never Used  . Alcohol Use: No   OB History    No data available     Review of Systems  Constitutional: Negative for fever.  Respiratory: Positive for cough and shortness of breath.   Cardiovascular: Positive for leg swelling. Negative for chest pain.  All other systems reviewed and are negative.     Allergies   Codeine  Home Medications   Prior to Admission medications   Medication Sig Start Date End Date Taking? Authorizing Provider  acetaminophen (TYLENOL) 500 MG tablet Take 1,000 mg by mouth 2 (two) times daily.   Yes Historical Provider, MD  amiodarone (PACERONE) 200 MG tablet Take 1 tablet (200 mg total) by mouth 2 (two) times daily. 09/22/15  Yes Christina P Rama, MD  bisacodyl (DULCOLAX) 10 MG suppository Place 10 mg rectally daily as needed for moderate constipation.   Yes Historical Provider, MD  calcium-vitamin D (OSCAL WITH D) 500-200 MG-UNIT tablet Take 1 tablet by mouth 2 (two) times daily.   Yes Historical Provider, MD  diclofenac sodium (VOLTAREN) 1 % GEL Apply 4 g topically 2 (two) times daily. To both knees   Yes Historical Provider, MD  feeding supplement, ENSURE ENLIVE, (ENSURE ENLIVE) LIQD Take 237 mLs by mouth daily. 09/25/14  Yes Yates Decamp, MD  ferrous sulfate 325 (65 FE) MG tablet Take 325 mg by mouth daily with breakfast.   Yes Historical Provider, MD  fluticasone (FLONASE) 50 MCG/ACT nasal spray Place 2 sprays into the nose daily. For allergies   Yes Historical Provider, MD  glucosamine-chondroitin 500-400 MG tablet Take 2 tablets by mouth 2 (two) times daily. For osteoarthritis   Yes Historical Provider, MD  guaiFENesin (ROBITUSSIN) 100 MG/5ML SOLN Take 10 mLs by mouth every 6 (six) hours  as needed for cough or to loosen phlegm.   Yes Historical Provider, MD  lidocaine (LIDODERM) 5 % Place 1 patch onto the skin daily. Remove & Discard patch within 12 hours to left shoulder   Yes Historical Provider, MD  lisinopril (PRINIVIL,ZESTRIL) 2.5 MG tablet Take 2.5 mg by mouth daily. Hold for SBP less than 100.   Yes Historical Provider, MD  magnesium hydroxide (MILK OF MAGNESIA) 400 MG/5ML suspension Take 30 mLs by mouth daily as needed for mild constipation or moderate constipation.    Yes Historical Provider, MD  meclizine (ANTIVERT) 12.5 MG tablet Take 12.5 mg by mouth 2 (two) times  daily as needed for dizziness. For dizziness   Yes Historical Provider, MD  oxyCODONE (OXY IR/ROXICODONE) 5 MG immediate release tablet Take one tablet by mouth every four hours as needed for pain 09/22/15  Yes Maryruth Bun Rama, MD  Polyethyl Glycol-Propyl Glycol (SYSTANE) 0.4-0.3 % SOLN Apply 1 drop to eye daily. Administer one drop in to each eye once daily for eye irritation.   Yes Historical Provider, MD  polyethylene glycol (MIRALAX / GLYCOLAX) packet Take 17 g by mouth daily. For constipation   Yes Historical Provider, MD  potassium chloride (KLOR-CON) 20 MEQ packet Take 40 mEq by mouth daily.   Yes Historical Provider, MD  Rivaroxaban (XARELTO) 15 MG TABS tablet Take 15 mg by mouth daily with supper. Give 1 tablet by mouth daily after evening meal for A-Fib.   Yes Historical Provider, MD  senna (SENOKOT) 8.6 MG TABS tablet Take 2 tablets by mouth at bedtime.   Yes Historical Provider, MD  sertraline (ZOLOFT) 100 MG tablet Take 100 mg by mouth daily.   Yes Historical Provider, MD  sertraline (ZOLOFT) 50 MG tablet Take 50 mg by mouth daily.    Yes Historical Provider, MD  Sodium Phosphates (RA SALINE ENEMA RE) Place 1 Applicatorful rectally daily as needed (constipation).   Yes Historical Provider, MD  torsemide (DEMADEX) 20 MG tablet Take 20 mg by mouth 2 (two) times daily. For congestive heart failure 12/31/12  Yes Tieshia A Durant, NP   BP 105/67 mmHg  Pulse 92  Temp(Src) 98.7 F (37.1 C) (Oral)  Resp 18  SpO2 100% Physical Exam  Constitutional: She is oriented to person, place, and time. She appears well-developed and well-nourished. No distress.  HENT:  Head: Normocephalic and atraumatic.  Right Ear: External ear normal.  Left Ear: External ear normal.  Nose: Nose normal.  Eyes: Right eye exhibits no discharge. Left eye exhibits no discharge.  Neck: Neck supple.  Cardiovascular: Normal rate, regular rhythm and normal heart sounds.   Pulmonary/Chest: Effort normal. No respiratory  distress. She has rales.  Abdominal: Soft. She exhibits no distension. There is no tenderness.  Musculoskeletal: She exhibits edema (trace pedal edema bilaterally).  Neurological: She is alert and oriented to person, place, and time.  Skin: Skin is warm and dry. She is not diaphoretic.  Nursing note and vitals reviewed.   ED Course  Procedures (including critical care time) Labs Review Labs Reviewed  BASIC METABOLIC PANEL - Abnormal; Notable for the following:    Glucose, Bld 103 (*)    BUN 35 (*)    Creatinine, Ser 1.73 (*)    GFR calc non Af Amer 25 (*)    GFR calc Af Amer 29 (*)    All other components within normal limits  TROPONIN I - Abnormal; Notable for the following:    Troponin I 0.05 (*)  All other components within normal limits  BRAIN NATRIURETIC PEPTIDE - Abnormal; Notable for the following:    B Natriuretic Peptide 2201.4 (*)    All other components within normal limits  CBC WITH DIFFERENTIAL/PLATELET - Abnormal; Notable for the following:    RBC 3.82 (*)    Hemoglobin 10.6 (*)    HCT 34.0 (*)    RDW 17.6 (*)    All other components within normal limits  BASIC METABOLIC PANEL    Imaging Review Dg Chest 2 View  09/27/2015  CLINICAL DATA:  Difficulty breathing/ dyspnea. Congestive heart failure. EXAM: CHEST  2 VIEW COMPARISON:  09/20/2015 FINDINGS: Moderate enlargement of the cardiopericardial silhouette with a large left pleural effusion. Left pleural effusion mildly worsened from prior. Trace right pleural effusion. Mild right perihilar atelectasis. Thoracic spondylosis. Severe degenerative glenohumeral arthropathy, left greater than right. Indistinct central pulmonary vasculature. IMPRESSION: 1. Large left pleural effusion with associated atelectasis. 2. Moderate cardiomegaly, there is potentially underlying pulmonary venous hypertension. 3. Thoracic spondylosis. 4. Degenerative glenohumeral arthropathy bilaterally. Electronically Signed   By: Gaylyn Rong  M.D.   On: 09/27/2015 18:34   I have personally reviewed and evaluated these images and lab results as part of my medical decision-making.   EKG Interpretation   Date/Time:  Monday September 27 2015 17:33:21 EDT Ventricular Rate:  91 PR Interval:    QRS Duration: 143 QT Interval:  394 QTC Calculation: 485 R Axis:   146 Text Interpretation:  Sinus arrhythmia Nonspecific intraventricular  conduction delay no significant change since earlier in the day Confirmed  by Ronelle Michie MD, Dulse Rutan [id number]) on 09/27/2015 5:38:05 PM      MDM   Final diagnoses:  Acute on chronic congestive heart failure, unspecified congestive heart failure type [facility])    Patient appears to have a recurrent CHF exacerbation. Patient is overall not in distress and not significantly short of breath while at rest. However she has a large pleural effusion and will be diuresed. BNP is elevated. Very mild troponin elevation is likely from heart strain but I doubt ACS. No current chest pain or recent chest pain. Admit to the hospitalist.    Pricilla Loveless, MD 09/27/15 2324

## 2015-09-27 NOTE — ED Notes (Signed)
Lanora Manis, RN from [facility] called stating pt is now at pt of theirs and would like to be informed of pts status when admitted. [phone]

## 2015-09-27 NOTE — Progress Notes (Signed)
Location:  [facility] Room Number: 311/W Place of Service:  SNF [phone]) Provider:  Juanetta Beets, MD  Patient Care Team: Margit Hanks, MD as PCP - General (Internal Medicine)  Extended Emergency Contact Information Primary Emergency Contact: Huntely,Douschkas Address: [address] [place], [place] [address] Darden Amber of [place] [address] Phone: [phone] Relation: Niece Secondary Emergency Contact: [facility] Address: [address] [place], [place] [address] Darden [place] of [place] Phone: [phone] Relation: Other  Code Status: DNR Goals of care: Advanced Directive information Advanced Directives 09/27/2015  Does patient have an advance directive? Yes  Type of Advance Directive Out of facility DNR (pink MOST or yellow form)  Does patient want to make changes to advanced directive? No - Patient declined  Copy of advanced directive(s) in chart? Yes     Chief Complaint  Patient presents with  . Acute Visit  Secondary to shortness of breath  HPI:  Pt is a 82 y.o. female seen today for an acute visit for complaints of acute shortness of breath.-She was in the hospital possibly a week ago for similar complaints-she was found to have a CHF exasperations complicated with A. fib tachycardia-this was stabilized she did receive IV Lasix and continues now on her routine dose of Demadex 20 mg a day.  She does have a history of chronic combined diastolic and systolic CHF with an ejection fraction of 20% in rate 1 diastolic dysfunction.  She is on a low-dose ACE inhibitor has not tolerated a beta blocker in the past secondary to severe Preeti Cardia.  This afternoon patient is complaining of somewhat acute shortness of breath I suspect this is complicated somewhat with her history of anxiety as well.  Per her last discharge from hospital consideration was to be given for  hospice but this is pending-patient is asking to go to the hospital again if there is anything that can be done.  Her vital signs are stable she is on 98% on chronic oxygen blood pressure is mildly low at 98/60 she does complain of dizziness although she does have a history of vertigo.  Again she does have a significant history of anxiety as  Well       Past Medical History  Diagnosis Date  . CHF (congestive heart failure) (HCC)   . A-fib (HCC)   . Hypertension   . Constipation   . Arthritis   . Chronic systolic heart failure (HCC)   . Dependence on supplemental oxygen   . Other chronic pain   . Contracture of ankle and foot joint   . Muscle weakness (generalized)   . Lack of coordination   . Unspecified essential hypertension   . Edema   . GERD (gastroesophageal reflux disease)   . Hyperlipidemia    Past Surgical History  Procedure Laterality Date  . Right ankle orif    . Fracture surgery      Allergies  Allergen Reactions  . Codeine Other (See Comments)    Unk      Medication List       This list is accurate as of: 09/27/15  4:34 PM.  Always use your most recent med list.               acetaminophen 500 MG tablet  Commonly known as:  TYLENOL  Take 1,000 mg by mouth  2 (two) times daily.     amiodarone 200 MG tablet  Commonly known as:  PACERONE  Take 1 tablet (200 mg total) by mouth 2 (two) times daily.     bisacodyl 10 MG suppository  Commonly known as:  DULCOLAX  Place 10 mg rectally daily as needed for moderate constipation.     calcium-vitamin D 500-200 MG-UNIT tablet  Commonly known as:  OSCAL WITH D  Take 1 tablet by mouth 2 (two) times daily.     diclofenac sodium 1 % Gel  Commonly known as:  VOLTAREN  Apply 4 g topically 2 (two) times daily. To both knees     feeding supplement (ENSURE ENLIVE) Liqd  Take 237 mLs by mouth daily.     ferrous sulfate 325 (65 FE) MG tablet  Take 325 mg by mouth daily with breakfast.     fluticasone 50  MCG/ACT nasal spray  Commonly known as:  FLONASE  Place 2 sprays into the nose daily. For allergies     glucosamine-chondroitin 500-400 MG tablet  Take 2 tablets by mouth 2 (two) times daily. For osteoarthritis     guaiFENesin 100 MG/5ML Soln  Commonly known as:  ROBITUSSIN  Take 10 mLs by mouth every 6 (six) hours as needed for cough or to loosen phlegm.     lidocaine 5 %  Commonly known as:  LIDODERM  Place 1 patch onto the skin daily. Remove & Discard patch within 12 hours to left shoulder     lisinopril 2.5 MG tablet  Commonly known as:  PRINIVIL,ZESTRIL  Take 2.5 mg by mouth daily. Hold for SBP less than 100.     magnesium hydroxide 400 MG/5ML suspension  Commonly known as:  MILK OF MAGNESIA  Take 30-60 mLs by mouth daily as needed for mild constipation or moderate constipation.     meclizine 12.5 MG tablet  Commonly known as:  ANTIVERT  Take 12.5 mg by mouth 2 (two) times daily as needed for dizziness. For dizziness     oxyCODONE 5 MG immediate release tablet  Commonly known as:  Oxy IR/ROXICODONE  Take one tablet by mouth every four hours as needed for pain     polyethylene glycol packet  Commonly known as:  MIRALAX / GLYCOLAX  Take 17 g by mouth daily. For constipation     potassium chloride 20 MEQ packet  Commonly known as:  KLOR-CON  Take 40 mEq by mouth daily.     RA SALINE ENEMA RE  Place 1 Applicatorful rectally daily as needed (constipation).     Rivaroxaban 15 MG Tabs tablet  Commonly known as:  XARELTO  Take 15 mg by mouth daily with supper. Give 1 tablet by mouth daily after evening meal for A-Fib.     sennosides-docusate sodium 8.6-50 MG tablet  Commonly known as:  SENOKOT-S  Take 2 tablets by mouth at bedtime.     sertraline 50 MG tablet  Commonly known as:  ZOLOFT  Take 150 mg by mouth daily. Give 1 tab along with Zoloft 100 mg to equal 150 mg daily by mouth for depression and anxiety     SYSTANE 0.4-0.3 % Soln  Generic drug:  Polyethyl  Glycol-Propyl Glycol  Apply 1 drop to eye daily. Administer one drop in to each eye once daily for eye irritation.     torsemide 20 MG tablet  Commonly known as:  DEMADEX  Take 20 mg by mouth 2 (two) times daily. For congestive heart failure  Review of Systems   Gen. she is not complaining of fever chills.  Skin does not complain of rashes or itching.  Head ears eyes nose mouth and throat is complaining of dizziness but does not complain of visual changes beyond baseline does not complain of headache.  Respiratory gas is complaining of somewhat acute shortness of breath feeling she can't catch her breath.  Cardiac does not complain of chest pain specifically she does have lower extremity edema which appears relatively baseline.  GI is not complaining of acute abdominal discomfort does at times complain of intermittent abdominal discomfort doesn't complain of nausea currently or vomiting.  GU is not complaining of dysuria.  Muscle skeletal has significant weakness lower extremities but is not really complaining of joint pain.  Neurologic does complain of dizziness she says this has got somewhat worse recently she does have a history of vertigo does not complain of headache.  It psych does have a history of depression she is on Zoloft also intermittent anxiety which may be contributing to her symptoms today  Immunization History  Administered Date(s) Administered  . Influenza Whole 01/22/2013  . Influenza-Unspecified 01/11/2012, 01/20/2014, 01/04/2015  . PPD Test 11/20/2011   Pertinent  Health Maintenance Due  Topic Date Due  . DEXA SCAN  04/10/2016 (Originally 04/03/1990)  . PNA vac Low Risk Adult (1 of 2 - PCV13) 04/11/2023 (Originally 04/03/1990)  . INFLUENZA VACCINE  11/09/2015   No flowsheet data found. Functional Status Survey:      Physical Exam She is afebrile pulse 95 respirations 24 blood pressure 98/60 O2 saturation is 98% on chronic oxygen.  In  general this is a frail appearing elderly female does not appear in acute distress but appears increasingly weak and somewhat anxious.  Her skin is warm and dry.  Oropharynx is clear mucous membranes moist  Chest is clear to auscultation there is mildly labored breathing somewhat increased respiratory rate.  Heart is regular irregular rate and rhythm she has baseline I would say one plus lower extremity edema she has her legs elevated bed.  Her abdomen is obese soft does not appear to be acutely tender there are positive bowel sounds.  GU cannot appreciate suprapubic tenderness.  Muscle skeletal is able to walk extremities 4 with lower extremity weakness which is her baseline.  Neurologic is grossly up baseline cranial nerves intact her speech is clear no lateralizing findings.  Psych she appears grossly alert and oriented quite anxious asking to go to the hospital there is anything more that can be done Labs reviewed:  Recent Labs  08/13/15 09/20/15 2325 09/22/15 0303  NA 141 137 138  K 3.7 3.9 3.8  CL  --  105 103  CO2  --  24 27  GLUCOSE  --  126* 98  BUN 31* 26* 25*  CREATININE 1.6* 1.82* 1.75*  CALCIUM  --  10.0 9.5    Recent Labs  05/25/15 08/09/15  AST 14 11*  ALT 8 10  ALKPHOS 94 78    Recent Labs  08/09/15 09/20/15 2325 09/22/15 0303  WBC 6.5 9.4 7.9  HGB 9.9* 10.4* 10.0*  HCT 31* 33.7* 32.4*  MCV  --  87.8 87.6  PLT 427* 281 260   Lab Results  Component Value Date   TSH 1.67 06/23/2015   No results found for: HGBA1C No results found for: CHOL, HDL, LDLCALC, LDLDIRECT, TRIG, CHOLHDL  Significant Diagnostic Results in last 30 days:  Dg Chest 2 View  09/20/2015  CLINICAL  DATA:  Shortness of breath tonight. No chest pain. History of CHF EXAM: CHEST  2 VIEW COMPARISON:  09/23/2014 FINDINGS: Cardiac enlargement without vascular congestion. Since the previous study, there is developing bilateral pleural effusions, greater on the left, with infiltration  or atelectasis in the left lung base. Changes could represent fluid overload. Superimposed pneumonia on the left is not excluded. No pneumothorax. Mediastinal contours appear intact. Degenerative changes in the spine and shoulders. IMPRESSION: Developing bilateral pleural effusions, greater on the left, with infiltration or atelectasis in the left lung base. Cardiac enlargement. Electronically Signed   By: Burman Nieves M.D.   On: 09/20/2015 23:39    Assessment/Plan 1 acute shortness of breath with history of end-stage CHF as noted above-patient is asking to go to the hospital if there is anything more that can be done-nursing has called her responsible party as well-per discussion with the patient at  Bedside  as well as nursing discussion with responsible party -we'll send her to the ER for expedient evaluation-I suspect anxiety may be a component in this as well-patient is rather distressed-but she- would feel more comfortable being evaluated in the hospital and we will honor her wishes-I suspect down the road if patient is in agreement hospice may be of great benefit   Of note patient's status remained stable per serial exams before EMS arrived again will await Hospital evaluation     \[id number]      London Sheer, [place] [phone]

## 2015-09-27 NOTE — ED Notes (Signed)
Pt recently seen at [facility] for fluid in lungs was treated with lasix and believes she is still having trouble breathing from more fluid. Pt is from an assisted living and is non ambulatory.

## 2015-09-28 DIAGNOSIS — J90 Pleural effusion, not elsewhere classified: Secondary | ICD-10-CM

## 2015-09-28 DIAGNOSIS — N184 Chronic kidney disease, stage 4 (severe): Secondary | ICD-10-CM

## 2015-09-28 DIAGNOSIS — I5021 Acute systolic (congestive) heart failure: Secondary | ICD-10-CM

## 2015-09-28 DIAGNOSIS — I482 Chronic atrial fibrillation: Secondary | ICD-10-CM

## 2015-09-28 LAB — BASIC METABOLIC PANEL
Anion gap: 9 (ref 5–15)
BUN: 34 mg/dL — ABNORMAL HIGH (ref 6–20)
CHLORIDE: 102 mmol/L (ref 101–111)
CO2: 27 mmol/L (ref 22–32)
CREATININE: 1.75 mg/dL — AB (ref 0.44–1.00)
Calcium: 9.3 mg/dL (ref 8.9–10.3)
GFR calc non Af Amer: 24 mL/min — ABNORMAL LOW (ref >60)
GFR, EST AFRICAN AMERICAN: 28 mL/min — AB (ref >60)
Glucose, Bld: 108 mg/dL — ABNORMAL HIGH (ref 65–99)
Potassium: 3.5 mmol/L (ref 3.5–5.1)
Sodium: 138 mmol/L (ref 135–145)

## 2015-09-28 MED ORDER — MECLIZINE HCL 25 MG PO TABS
12.5000 mg | ORAL_TABLET | Freq: Once | ORAL | Status: AC
  Administered 2015-09-28: 12.5 mg via ORAL

## 2015-09-28 NOTE — Progress Notes (Signed)
[facility] -[id number]  This is a GIP, related and covered admission of 09/27/15 per Dr. Sonia Baller. This patient resides at [facility] and  was just admitted to [facility] yesterday with an [facility] dx of heart disease. She transferred to [facility] ED yesterday afternoon with c/o shortness of breath, dizziness and chest pain, as well as lower extremity edema.   Patient was hospitalized  due to exacerbation of CHF and pleural effusion. Per  chart review the patient was also tachycardic with HR .160 in the ED.    Code status is DNR   Visited patient at bedside.  Her nephew was also present.  She is awake, alert and able to interact with RN appropriately. She verbalized that she is feeling better now, but that yesterday she felt as though she was dying. She stated that she was so short of breath which got progressively worse through the day yesterday. She is currently on 02 at 2L. She denied pain.  The plan is to diurese her, readjust her meds and then discharge back to [facility].  She has received one dose of Laxis 80mg  IV and now has orders for po lasix 60mg  BID.  She does have +2 BLE edema.    The patient did  also c/o dizziness and she has received antivert 12.5mg  x 2 since admission which was effective.    Appetite has been poor.  Patient reports that she is unable to walk or transfer independently and it is normal for her to become extremely dyspneic with minimal exertion.      Education provided to patient and nephew that [facility] is available to assist should her symptoms worsen after discharge.   They indicated understanding.   [facility] will continue to follow and anticipate discharge needs.  Please call with hospice related questions or concerns.   Lavone Neri, RN [facility]  [phone]

## 2015-09-28 NOTE — Progress Notes (Signed)
Pt is c/o "dizzy spells" while simultaneously HR increases to 160's-180's and then comes back down shortly after. NP on call, Donnamarie Poag, notified. Will continue to monitor pt closely. Mardene Celeste I

## 2015-09-28 NOTE — Progress Notes (Signed)
PROGRESS NOTE    Kara Wilkerson  RCV:[id number] DOB: 01-05-1923 DOA: 09/27/2015 PCP: Margit Hanks, MD   Brief Narrative:  HPI on 09/27/2015 by Dr. Eston Esters Patient is a 82 yo female with history of CHF, HTN, CKD, Afib who came with cc of dyspnea for a few weeks but worsening over the past two days. She also has orthopnea, PND and LE edema. She was discharged last week after admission with similar symptoms for acute HF. She denied any chest pain. She had couple of episodes of vomiting two days ago but none afterwards. She has no nausea or abdominal pain today. No fever/chills or cough. No other complaints.   Assessment & Plan   Acute systolic/diastolic heart failure -BNP 2201 -Echocardiogram June 2016 showed an EF of 20%, grade 1 diastolic dysfunction -recently admitted and discharged -Patient was given IV Lasix and transitioned to by mouth Lasix twice a day -Monitor intake and output, daily weights -Patient was on torsemide at home -Continue lisinopril  Left pleural effusion -Noted on chest x-ray -Secondary to the above -Continue diuresis, if not improved, will discuss thoracentesis with patient  Essential hypertension -Continue lisinopril and diuretics  Atrial fibrillation -CHADSVASC 5 -Continue amiodarone, xarelto  Chronic kidney disease, stage III-IV -Creatinine appears at baseline, continue to monitor BMP  Depression -Continue zoloft   Normocytic anemia -Baseline hemoglobin approximately 10, currently 10.6 -Continue monitor CBC  Dizziness -Continue meclizine PRN  DVT Prophylaxis  Xarelto  Code Status: DNR  Family Communication: none at bedside  Disposition Plan: Admitted  Consultants None  Procedures  None  Antibiotics   Anti-infectives    None      Subjective:   Kara Wilkerson seen and examined today.  Patient states her breathing has improved. Denies chest pain, abdominal pain, headache.    Objective:   Filed Vitals:   09/27/15 1900  09/27/15 1959 09/28/15 0420 09/28/15 1347  BP: 125/85 97/46 99/81  107/75  Pulse: 92 87 87 94  Temp:  97.8 F (36.6 C) 97.8 F (36.6 C) 97.7 F (36.5 C)  TempSrc:  Oral Oral Oral  Resp: 25 26 18 20   Height:  5\' 3"  (1.6 m)    Weight:  79.8 kg (175 lb 14.8 oz) 79.5 kg (175 lb 4.3 oz)   SpO2: 99% 100% 100% 100%    Intake/Output Summary (Last 24 hours) at 09/28/15 1415 Last data filed at 09/28/15 0900  Gross per 24 hour  Intake    120 ml  Output      0 ml  Net    120 ml   Filed Weights   09/27/15 1848 09/27/15 1959 09/28/15 0420  Weight: 79.833 kg (176 lb) 79.8 kg (175 lb 14.8 oz) 79.5 kg (175 lb 4.3 oz)    Exam  General: Well developed, well nourished, NAD  HEENT: NCAT,mucous membranes moist.   Neck: Supple, no JVD, no masses  Cardiovascular: S1 S2 auscultated, irregular  Respiratory: +Crackles  Abdomen: Soft, nontender, nondistended, + bowel sounds  Extremities: warm dry without cyanosis clubbing or edema  Neuro: AAOx3,   Psych: Normal affect and demeanor    Data Reviewed: I have personally reviewed following labs and imaging studies  CBC:  Recent Labs Lab 09/22/15 0303 09/27/15 1754  WBC 7.9 9.9  NEUTROABS  --  6.1  HGB 10.0* 10.6*  HCT 32.4* 34.0*  MCV 87.6 89.0  PLT 260 307   Basic Metabolic Panel:  Recent Labs Lab 09/22/15 0303 09/27/15 1754 09/28/15 0451  NA 138 138 138  K 3.8 4.3 3.5  CL 103 103 102  CO2 GLUCOSE 98 103* 108*  BUN 25* 35* 34*  CREATININE 1.75* 1.73* 1.75*  CALCIUM 9.5 9.6 9.3   GFR: Estimated Creatinine Clearance: 21.3 mL/min (by C-G formula based on Cr of 1.75). Liver Function Tests: No results for input(s): AST, ALT, ALKPHOS, BILITOT, PROT, ALBUMIN in the last 168 hours. No results for input(s): LIPASE, AMYLASE in the last 168 hours. No results for input(s): AMMONIA in the last 168 hours. Coagulation Profile: No results for input(s): INR, PROTIME in the last 168 hours. Cardiac Enzymes:  Recent  Labs Lab 09/27/15 1754  TROPONINI 0.05*   BNP (last 3 results) No results for input(s): PROBNP in the last 8760 hours. HbA1C: No results for input(s): HGBA1C in the last 72 hours. CBG: No results for input(s): GLUCAP in the last 168 hours. Lipid Profile: No results for input(s): CHOL, HDL, LDLCALC, TRIG, CHOLHDL, LDLDIRECT in the last 72 hours. Thyroid Function Tests: No results for input(s): TSH, T4TOTAL, FREET4, T3FREE, THYROIDAB in the last 72 hours. Anemia Panel: No results for input(s): VITAMINB12, FOLATE, FERRITIN, TIBC, IRON, RETICCTPCT in the last 72 hours. Urine analysis:    Component Value Date/Time   COLORURINE STRAW* 09/21/2015 0002   APPEARANCEUR CLOUDY* 09/21/2015 0002   LABSPEC 1.010 09/21/2015 0002   PHURINE 6.5 09/21/2015 0002   GLUCOSEU NEGATIVE 09/21/2015 0002   HGBUR TRACE* 09/21/2015 0002   BILIRUBINUR NEGATIVE 09/21/2015 0002   KETONESUR NEGATIVE 09/21/2015 0002   PROTEINUR NEGATIVE 09/21/2015 0002   NITRITE POSITIVE* 09/21/2015 0002   LEUKOCYTESUR LARGE* 09/21/2015 0002   Sepsis Labs: (procalcitonin:4,lacticidven:4)  ) Recent Results (from the past 240 hour(s))  MRSA PCR Screening     Status: None   Collection Time: 09/21/15  3:19 AM  Result Value Ref Range Status   MRSA by PCR NEGATIVE NEGATIVE Final    Comment:        The GeneXpert MRSA Assay (FDA approved for NASAL specimens only), is one component of a comprehensive MRSA colonization surveillance program. It is not intended to diagnose MRSA infection nor to guide or monitor treatment for MRSA infections.       Radiology Studies: Dg Chest 2 View  09/27/2015  CLINICAL DATA:  Difficulty breathing/ dyspnea. Congestive heart failure. EXAM: CHEST  2 VIEW COMPARISON:  09/20/2015 FINDINGS: Moderate enlargement of the cardiopericardial silhouette with a large left pleural effusion. Left pleural effusion mildly worsened from prior. Trace right pleural effusion. Mild right perihilar  atelectasis. Thoracic spondylosis. Severe degenerative glenohumeral arthropathy, left greater than right. Indistinct central pulmonary vasculature. IMPRESSION: 1. Large left pleural effusion with associated atelectasis. 2. Moderate cardiomegaly, there is potentially underlying pulmonary venous hypertension. 3. Thoracic spondylosis. 4. Degenerative glenohumeral arthropathy bilaterally. Electronically Signed   By: Gaylyn Rong M.D.   On: 09/27/2015 18:34     Scheduled Meds: . acetaminophen  1,000 mg Oral BID  . amiodarone  200 mg Oral BID  . calcium-vitamin D  1 tablet Oral BID  . feeding supplement (ENSURE ENLIVE)  237 mL Oral Q24H  . ferrous sulfate  325 mg Oral Q breakfast  . furosemide  60 mg Oral BID  . lisinopril  2.5 mg Oral Daily  . polyethylene glycol  17 g Oral Daily  . polyvinyl alcohol  1 drop Both Eyes Daily  . potassium chloride  40 mEq Oral Daily  . Rivaroxaban  15 mg Oral Q supper  . senna  2 tablet Oral QHS  .  sertraline  150 mg Oral Daily  . sodium chloride flush  3 mL Intravenous Q12H   Continuous Infusions:    LOS: 1 day   Time Spent in minutes   30 minutes  Yuritza Paulhus D.O. on 09/28/2015 at 2:15 PM  Between 7am to 7pm - Pager - [phone]  After 7pm go to [email or web address] - password [email or web address]  And look for the night coverage person covering for me after hours  [facility] Office  [phone]

## 2015-09-28 NOTE — [place] FL2 (Signed)
[place] MEDICAID FL2 LEVEL OF CARE SCREENING TOOL     IDENTIFICATION  Patient Name: Kara Wilkerson Birthdate: Oct 23, 1922 Sex: female Admission Date (Current Location): 09/27/2015  [facility] and [place] Number:  Producer, television/film/video and Address:  [facility],  [address] [place] [address]      Provider Number: [id number]  Attending Physician Name and Address:  Edsel Petrin, DO  Relative Name and Phone Number:       Current Level of Care: Hospital Recommended Level of Care: Nursing Facility Prior Approval Number:    Date Approved/Denied:   PASRR Number: [id number]  Discharge Plan: Home    Current Diagnoses: Patient Active Problem List   Diagnosis Date Noted  . CHF exacerbation (HCC) 09/27/2015  . Acute HF (heart failure) (HCC) 09/27/2015  . UTI (urinary tract infection) 09/22/2015  . Atrial fibrillation with RVR (HCC) 09/22/2015  . Acute on chronic combined systolic (congestive) and diastolic (congestive) heart failure (HCC) 09/21/2015  . DNR (do not resuscitate)   . Congestive dilated cardiomyopathy (HCC) 09/03/2015  . CKD (chronic kidney disease) stage 3, GFR 30-59 ml/min 07/24/2015  . Hot flashes 06/17/2015  . Cough 05/24/2015  . Syncope 11/30/2014  . FTT (failure to thrive) in adult 09/29/2014  . Palliative care encounter   . Shortness of breath   . Symptomatic bradycardia 09/24/2014  . Acute renal failure superimposed on stage 4 chronic kidney disease (HCC) 01/09/2014  . Edema 09/03/2013  . Anemia 09/03/2013  . Depression 08/13/2013  . Vitamin D deficiency 08/13/2013  . Paroxysmal atrial fibrillation (HCC) 10/16/2012  . Essential hypertension, benign 10/16/2012  . Chronic combined systolic and diastolic CHF (congestive heart failure) (HCC) 10/16/2012  . Unspecified constipation 10/16/2012  . Osteoarthritis 10/16/2012  . Other malaise and fatigue 10/16/2012  . Incomplete tear of rotator cuff 09/30/2012    Orientation RESPIRATION  BLADDER Height & Weight     Self, Time, Situation, Place  Normal Continent Weight: 175 lb 4.3 oz (79.5 kg) Height:   (160 cm)  BEHAVIORAL SYMPTOMS/MOOD NEUROLOGICAL BOWEL NUTRITION STATUS      Continent Diet (Heart)  AMBULATORY STATUS COMMUNICATION OF NEEDS Skin   Extensive Assist Verbally Normal                       Personal Care Assistance Level of Assistance  Bathing, Feeding, Dressing Bathing Assistance: Limited assistance Feeding assistance: Independent Dressing Assistance: Limited assistance     Functional Limitations Info  Sight, Hearing, Speech Sight Info: Adequate Hearing Info: Adequate Speech Info: Adequate    SPECIAL CARE FACTORS FREQUENCY                       Contractures      Additional Factors Info  Code Status, Allergies Code Status Info: DNR Allergies Info: Codeine           Current Medications (09/28/2015):  This is the current hospital active medication list Current Facility-Administered Medications  Medication Dose Route Frequency Provider Last Rate Last Dose  . 0.9 %  sodium chloride infusion  250 mL Intravenous PRN Eston Esters, MD      . acetaminophen (TYLENOL) tablet 1,000 mg  1,000 mg Oral BID Eston Esters, MD   1,000 mg at 09/28/15 1113  . acetaminophen (TYLENOL) tablet 650 mg  650 mg Oral Q4H PRN Eston Esters, MD      . amiodarone (PACERONE) tablet 200 mg  200 mg Oral BID Eston Esters, MD  200 mg at 09/28/15 1114  . bisacodyl (DULCOLAX) suppository 10 mg  10 mg Rectal Daily PRN Eston Esters, MD      . calcium-vitamin D (OSCAL WITH D) 500-200 MG-UNIT per tablet 1 tablet  1 tablet Oral BID Eston Esters, MD   1 tablet at 09/28/15 1113  . feeding supplement (ENSURE ENLIVE) (ENSURE ENLIVE) liquid 237 mL  237 mL Oral Q24H Eston Esters, MD   237 mL at 09/27/15 2212  . ferrous sulfate tablet 325 mg  325 mg Oral Q breakfast Eston Esters, MD   325 mg at 09/28/15 0849  . furosemide (LASIX) tablet 60 mg  60 mg Oral BID Eston Esters, MD   60 mg  at 09/28/15 0849  . guaiFENesin (ROBITUSSIN) 100 MG/5ML solution 200 mg  10 mL Oral Q6H PRN Eston Esters, MD      . lisinopril (PRINIVIL,ZESTRIL) tablet 2.5 mg  2.5 mg Oral Daily Eston Esters, MD   2.5 mg at 09/28/15 1118  . magnesium hydroxide (MILK OF MAGNESIA) suspension 30 mL  30 mL Oral Daily PRN Eston Esters, MD      . meclizine (ANTIVERT) tablet 12.5 mg  12.5 mg Oral BID PRN Eston Esters, MD   12.5 mg at 09/28/15 1113  . ondansetron (ZOFRAN) injection 4 mg  4 mg Intravenous Q6H PRN Eston Esters, MD      . oxyCODONE (Oxy IR/ROXICODONE) immediate release tablet 5 mg  5 mg Oral Q4H PRN Eston Esters, MD      . polyethylene glycol (MIRALAX / GLYCOLAX) packet 17 g  17 g Oral Daily Eston Esters, MD   17 g at 09/28/15 1114  . polyvinyl alcohol (LIQUIFILM TEARS) 1.4 % ophthalmic solution 1 drop  1 drop Both Eyes Daily Eston Esters, MD   1 drop at 09/28/15 1118  . potassium chloride 20 MEQ/15ML (10%) solution 40 mEq  40 mEq Oral Daily Eston Esters, MD   40 mEq at 09/28/15 1111  . Rivaroxaban (XARELTO) tablet 15 mg  15 mg Oral Q supper Eston Esters, MD   15 mg at 09/27/15 2210  . senna (SENOKOT) tablet 17.2 mg  2 tablet Oral QHS Eston Esters, MD   17.2 mg at 09/27/15 2210  . sertraline (ZOLOFT) tablet 150 mg  150 mg Oral Daily Eston Esters, MD   150 mg at 09/28/15 1113  . sodium chloride flush (NS) 0.9 % injection 3 mL  3 mL Intravenous Q12H Eston Esters, MD   3 mL at 09/28/15 1115  . sodium chloride flush (NS) 0.9 % injection 3 mL  3 mL Intravenous PRN Eston Esters, MD         Discharge Medications: Please see discharge summary for a list of discharge medications.  Relevant Imaging Results:  Relevant Lab Results:   Additional Information SSN: [id number]  Arlyss Repress, LCSW

## 2015-09-28 NOTE — Progress Notes (Signed)
CSW spoke with patient re: discharge plans. Patient informed CSW that she has been living at [facility] for the past 4 years and plans to return there when she's released from the hospital. CSW confirmed with Lowella Bandy at [facility] that they would be able to take patient back when ready.    Lincoln Maxin, LCSW [facility] Clinical Social Worker cell #: [phone]

## 2015-09-29 ENCOUNTER — Inpatient Hospital Stay (HOSPITAL_COMMUNITY)

## 2015-09-29 DIAGNOSIS — D649 Anemia, unspecified: Secondary | ICD-10-CM

## 2015-09-29 DIAGNOSIS — I509 Heart failure, unspecified: Secondary | ICD-10-CM | POA: Insufficient documentation

## 2015-09-29 DIAGNOSIS — N183 Chronic kidney disease, stage 3 (moderate): Secondary | ICD-10-CM

## 2015-09-29 DIAGNOSIS — I480 Paroxysmal atrial fibrillation: Secondary | ICD-10-CM

## 2015-09-29 LAB — CBC AND DIFFERENTIAL: WBC: 7.7 10^3/mL

## 2015-09-29 LAB — BASIC METABOLIC PANEL
ANION GAP: 8 (ref 5–15)
BUN: 37 mg/dL — AB (ref 6–20)
CO2: 27 mmol/L (ref 22–32)
Calcium: 9.3 mg/dL (ref 8.9–10.3)
Chloride: 102 mmol/L (ref 101–111)
Creatinine, Ser: 1.85 mg/dL — ABNORMAL HIGH (ref 0.44–1.00)
GFR calc Af Amer: 27 mL/min — ABNORMAL LOW (ref >60)
GFR, EST NON AFRICAN AMERICAN: 23 mL/min — AB (ref >60)
GLUCOSE: 95 mg/dL (ref 65–99)
POTASSIUM: 4.2 mmol/L (ref 3.5–5.1)
Sodium: 137 mmol/L (ref 135–145)

## 2015-09-29 LAB — CBC
HEMATOCRIT: 32.2 % — AB (ref 36.0–46.0)
HEMOGLOBIN: 10.3 g/dL — AB (ref 12.0–15.0)
MCH: 28.6 pg (ref 26.0–34.0)
MCHC: 32 g/dL (ref 30.0–36.0)
MCV: 89.4 fL (ref 78.0–100.0)
Platelets: 284 10*3/uL (ref 150–400)
RBC: 3.6 MIL/uL — ABNORMAL LOW (ref 3.87–5.11)
RDW: 17.7 % — AB (ref 11.5–15.5)
WBC: 7.7 10*3/uL (ref 4.0–10.5)

## 2015-09-29 NOTE — Progress Notes (Signed)
Chaplain visit the result of a staff referral. Kara Wilkerson was awake and ready for a visit. She reports that she is receiving exceptional care and that her breathing is better after taking a pill. She further reports that she has slept through the night for the first time in a long time and is appreciative to the staff for this blessing. Prayer and comfort provided.  [name]. Eyana Stolze, DMin Chaplain

## 2015-09-29 NOTE — Progress Notes (Signed)
PROGRESS NOTE    Kara Wilkerson  ZOX:[id number] DOB: [birth year] DOA: 09/27/2015 PCP: Margit Hanks, MD   Brief Narrative:  HPI on 09/27/2015 by Dr. Eston Esters Patient is a 82 yo female with history of CHF, HTN, CKD, Afib who came with cc of dyspnea for a few weeks but worsening over the past two days. She also has orthopnea, PND and LE edema. She was discharged last week after admission with similar symptoms for acute HF. She denied any chest pain. She had couple of episodes of vomiting two days ago but none afterwards. She has no nausea or abdominal pain today. No fever/chills or cough. No other complaints.   Assessment & Plan   Acute systolic/diastolic heart failure -BNP 2201 -Echocardiogram June 2016 showed an EF of 20%, grade 1 diastolic dysfunction -recently admitted and discharged on 09/22/2015 -Patient was given IV Lasix and transitioned to by mouth Lasix twice a day -Monitor intake and output, daily weights -output not accurate as patient does have some incontinence and refused foley -Patient was on torsemide at home -Continue lisinopril  Left pleural effusion -Noted on chest x-ray -Secondary to the above -Will repeat CXR today -Continue diuresis, if not improved, will discuss thoracentesis with patient  Essential hypertension -Continue lisinopril and diuretics  Paroxysmal Atrial fibrillation -CHADSVASC 5 -Continue amiodarone, xarelto  Chronic kidney disease, stage III-IV -Creatinine appears at baseline, continue to monitor BMP  Depression -Continue zoloft   Normocytic anemia -Baseline hemoglobin approximately 10, currently 10.3 -Continue monitor CBC  Dizziness -Continue meclizine PRN  DVT Prophylaxis  Xarelto  Code Status: DNR  Family Communication: none at bedside  Disposition Plan: Admitted. Pending improvement, repeat CXR today.   Consultants None  Procedures  None  Antibiotics   Anti-infectives    None      Subjective:   Kara Wilkerson seen and examined today.  Patient states her breathing has improved and she was able to sleep well. Denies chest pain, abdominal pain, headache.    Objective:   Filed Vitals:   09/28/15 0420 09/28/15 1347 09/28/15 2133 09/29/15 0509  BP: 99/81 107/75 91/72 104/76  Pulse: 87 94 86 82  Temp: 97.8 F (36.6 C) 97.7 F (36.5 C) 98.5 F (36.9 C) 98.2 F (36.8 C)  TempSrc: Oral Oral Oral Oral  Resp: Height:      Weight: 79.5 kg (175 lb 4.3 oz)   80.2 kg (176 lb 12.9 oz)  SpO2: 100% 100% 99% 100%    Intake/Output Summary (Last 24 hours) at 09/29/15 1134 Last data filed at 09/29/15 0943  Gross per 24 hour  Intake    460 ml  Output    400 ml  Net     60 ml   Filed Weights   09/27/15 1959 09/28/15 0420 09/29/15 0509  Weight: 79.8 kg (175 lb 14.8 oz) 79.5 kg (175 lb 4.3 oz) 80.2 kg (176 lb 12.9 oz)    Exam  General: Well developed, well nourished, no distress  HEENT: NCAT,mucous membranes moist.   Cardiovascular: S1 S2 auscultated, irregular  Respiratory: Diminished   Abdomen: Soft, nontender, nondistended, + bowel sounds  Extremities: warm dry without cyanosis clubbing. Mild LE edema.  Neuro: AAOx3, nonfocal  Psych: Normal affect and demeanor    Data Reviewed: I have personally reviewed following labs and imaging studies  CBC:  Recent Labs Lab 09/27/15 1754 09/29/15 0503  WBC 9.9 7.7  NEUTROABS 6.1  --   HGB 10.6* 10.3*  HCT 34.0*  32.2*  MCV 89.0 89.4  PLT 307 284   Basic Metabolic Panel:  Recent Labs Lab 09/27/15 1754 09/28/15 0451 09/29/15 0503  NA 138 138 137  K 4.3 3.5 4.2  CL 103 102 102  CO2 27 27 27   GLUCOSE 103* 108* 95  BUN 35* 34* 37*  CREATININE 1.73* 1.75* 1.85*  CALCIUM 9.6 9.3 9.3   GFR: Estimated Creatinine Clearance: 20.3 mL/min (by C-G formula based on Cr of 1.85). Liver Function Tests: No results for input(s): AST, ALT, ALKPHOS, BILITOT, PROT, ALBUMIN in the last 168 hours. No results for input(s): LIPASE,  AMYLASE in the last 168 hours. No results for input(s): AMMONIA in the last 168 hours. Coagulation Profile: No results for input(s): INR, PROTIME in the last 168 hours. Cardiac Enzymes:  Recent Labs Lab 09/27/15 1754  TROPONINI 0.05*   BNP (last 3 results) No results for input(s): PROBNP in the last 8760 hours. HbA1C: No results for input(s): HGBA1C in the last 72 hours. CBG: No results for input(s): GLUCAP in the last 168 hours. Lipid Profile: No results for input(s): CHOL, HDL, LDLCALC, TRIG, CHOLHDL, LDLDIRECT in the last 72 hours. Thyroid Function Tests: No results for input(s): TSH, T4TOTAL, FREET4, T3FREE, THYROIDAB in the last 72 hours. Anemia Panel: No results for input(s): VITAMINB12, FOLATE, FERRITIN, TIBC, IRON, RETICCTPCT in the last 72 hours. Urine analysis:    Component Value Date/Time   COLORURINE STRAW* 09/21/2015 0002   APPEARANCEUR CLOUDY* 09/21/2015 0002   LABSPEC 1.010 09/21/2015 0002   PHURINE 6.5 09/21/2015 0002   GLUCOSEU NEGATIVE 09/21/2015 0002   HGBUR TRACE* 09/21/2015 0002   BILIRUBINUR NEGATIVE 09/21/2015 0002   KETONESUR NEGATIVE 09/21/2015 0002   PROTEINUR NEGATIVE 09/21/2015 0002   NITRITE POSITIVE* 09/21/2015 0002   LEUKOCYTESUR LARGE* 09/21/2015 0002   Sepsis Labs: @LABRCNTIP (procalcitonin:4,lacticidven:4)  ) Recent Results (from the past 240 hour(s))  MRSA PCR Screening     Status: None   Collection Time: 09/21/15  3:19 AM  Result Value Ref Range Status   MRSA by PCR NEGATIVE NEGATIVE Final    Comment:        The GeneXpert MRSA Assay (FDA approved for NASAL specimens only), is one component of a comprehensive MRSA colonization surveillance program. It is not intended to diagnose MRSA infection nor to guide or monitor treatment for MRSA infections.       Radiology Studies: Dg Chest 2 View  09/27/2015  CLINICAL DATA:  Difficulty breathing/ dyspnea. Congestive heart failure. EXAM: CHEST  2 VIEW COMPARISON:  09/20/2015  FINDINGS: Moderate enlargement of the cardiopericardial silhouette with a large left pleural effusion. Left pleural effusion mildly worsened from prior. Trace right pleural effusion. Mild right perihilar atelectasis. Thoracic spondylosis. Severe degenerative glenohumeral arthropathy, left greater than right. Indistinct central pulmonary vasculature. IMPRESSION: 1. Large left pleural effusion with associated atelectasis. 2. Moderate cardiomegaly, there is potentially underlying pulmonary venous hypertension. 3. Thoracic spondylosis. 4. Degenerative glenohumeral arthropathy bilaterally. Electronically Signed   By: Gaylyn Rong M.D.   On: 09/27/2015 18:34     Scheduled Meds: . acetaminophen  1,000 mg Oral BID  . amiodarone  200 mg Oral BID  . calcium-vitamin D  1 tablet Oral BID  . feeding supplement (ENSURE ENLIVE)  237 mL Oral Q24H  . ferrous sulfate  325 mg Oral Q breakfast  . furosemide  60 mg Oral BID  . lisinopril  2.5 mg Oral Daily  . polyethylene glycol  17 g Oral Daily  . polyvinyl alcohol  1 drop  Both Eyes Daily  . potassium chloride  40 mEq Oral Daily  . Rivaroxaban  15 mg Oral Q supper  . senna  2 tablet Oral QHS  . sertraline  150 mg Oral Daily  . sodium chloride flush  3 mL Intravenous Q12H   Continuous Infusions:    LOS: 2 days   Time Spent in minutes   30 minutes  Kara Wilkerson D.O. on 09/29/2015 at 11:34 AM  Between 7am to 7pm - Pager - [phone]  After 7pm go to [email or web address] - password [email or web address]  And look for the night coverage person covering for me after hours  [facility] Office  [phone]

## 2015-09-29 NOTE — Progress Notes (Signed)
[facility]-GIP RN Visit  This is a GIP, related and covered admission of 09/27/15 per Dr. Sonia Baller. This patient resides at [facility] and was just admitted to [facility] 09/27/15 with diagnosis of heart disease.  Patient seen in room, sitting up in bed. No family present at time of visit.  Patient alert, oriented and with pleasant affect.  Patient denied any pain and stated she is no longer feeling short of breath.  Patient is on 2L [place] with O2 sat at 100% and respirations at 18. She is currently taking 60 mg Lasix po.  Patient verbalized her dizziness has resolved and she was able to get a good night of sleep. She has able to eat approximately 50% of her lunch tray.  She stated she has not been out of bed this hospital admission and feels weak and deconditioned.  Spoke to [facility]G, Rita to update on POC per patient request.  [place] Sink verbalized concerns that patient has been "anxious" prior to her recent hospital admissions.  Updated hospitalist on this concern.  [place] Sink and patient denied any additional DME needs prior to discharge.  [facility] will continue to follow and anticipate discharge needs. Please call with hospice related questions or concerns.   Hessie Knows, RN, BSN [facility]  [phone]

## 2015-09-29 NOTE — Clinical Documentation Improvement (Signed)
Internal Medicine  Can the diagnosis of Atrial Fibrillation be further specified? Please document response in next progress note. Thank you!   Chronic Atrial fibrillation  Paroxysmal Atrial fibrillation  Permanent Atrial fibrillation  Persistent Atrial fibrillation  Other  Clinically Undetermined  Document any associated diagnoses/conditions.  Supporting Information:  Atrial fibrillation  -CHADSVASC 5  -Continue amiodarone, xarelto   Please exercise your independent, professional judgment when responding. A specific answer is not anticipated or expected.  Thank You,  Shellee Milo RN, BSN, CCDS Health Information Management [place] [phone]; Cell: [phone]

## 2015-09-30 DIAGNOSIS — I5041 Acute combined systolic (congestive) and diastolic (congestive) heart failure: Secondary | ICD-10-CM

## 2015-09-30 LAB — BASIC METABOLIC PANEL
Anion gap: 6 (ref 5–15)
BUN: 42 mg/dL — AB (ref 4–21)
BUN: 42 mg/dL — AB (ref 6–20)
CHLORIDE: 104 mmol/L (ref 101–111)
CO2: 28 mmol/L (ref 22–32)
CREATININE: 1.94 mg/dL — AB (ref 0.44–1.00)
Calcium: 9.3 mg/dL (ref 8.9–10.3)
Creatinine: 1.9 mg/dL — AB (ref <=1.1)
GFR calc Af Amer: 25 mL/min — ABNORMAL LOW (ref >60)
GFR calc non Af Amer: 22 mL/min — ABNORMAL LOW (ref >60)
GLUCOSE: 92 mg/dL (ref 65–99)
Glucose: 92 mg/dL
POTASSIUM: 4.3 mmol/L (ref 3.5–5.1)
Potassium: 4.3 mmol/L (ref 3.4–5.3)
SODIUM: 138 mmol/L (ref 135–145)
Sodium: 138 mmol/L (ref 137–147)

## 2015-09-30 MED ORDER — OXYCODONE HCL 5 MG PO TABS: ORAL_TABLET | ORAL | Status: AC

## 2015-09-30 MED ORDER — ALPRAZOLAM 0.25 MG PO TABS: 0.2500 mg | ORAL_TABLET | Freq: Two times a day (BID) | ORAL | Status: DC | PRN

## 2015-09-30 NOTE — Progress Notes (Signed)
Patient is set to discharge back to [facility] today. Patient made aware. Discharge packet given to RN, Florentina Addison. [facility] called for transport.     Lincoln Maxin, LCSW [facility] Clinical Social Worker cell #: [phone]

## 2015-09-30 NOTE — Discharge Summary (Signed)
Physician Discharge Summary  Kara Wilkerson MVH:[id number] DOB: [birth year] DOA: 09/27/2015  PCP: Margit Hanks, MD  Admit date: 09/27/2015 Discharge date: 09/30/2015  Time spent: 45 minutes  Recommendations for Outpatient Follow-up:  Patient will be discharged to [facility] facility with hospice to follow.  Patient will need to follow up with primary care provider within one week of discharge.  Patient should continue medications as prescribed.  Patient should follow a heart healthy diet.   Discharge Diagnoses:  Dyspnea secondary to acute systolic and diastolic heart failure Left pleural effusion Essential hypertension Paroxysmal atrial fibrillation Chronic kidney disease, stage 3-4 Depression Normocytic anemia Dizziness  Discharge Condition: Stable  Diet recommendation: Heart healthy  Filed Weights   09/28/15 0420 09/29/15 0509 09/30/15 0610  Weight: 79.5 kg (175 lb 4.3 oz) 80.2 kg (176 lb 12.9 oz) 78.9 kg (173 lb 15.1 oz)    History of present illness:  on 09/27/2015 by Dr. Eston Esters Patient is a 82 yo female with history of CHF, HTN, CKD, Afib who came with cc of dyspnea for a few weeks but worsening over the past two days. She also has orthopnea, PND and LE edema. She was discharged last week after admission with similar symptoms for acute HF. She denied any chest pain. She had couple of episodes of vomiting two days ago but none afterwards. She has no nausea or abdominal pain today. No fever/chills or cough. No other complaints.   Hospital Course:  Dyspnea secondary to Acute systolic/diastolic heart failure -BNP 2201 -Echocardiogram June 2016 showed an EF of 20%, grade 1 diastolic dysfunction -recently admitted and discharged on 09/22/2015 -Patient was given IV Lasix and transitioned to by mouth Lasix twice a day -Monitor intake and output, daily weights -output not accurate as patient does have some incontinence and refused foley -Patient was on torsemide at  home -Continue lisinopril -Spoke with hospice RN, dyspnea may be complicated by anxiety.  Will discharge patient with low dose anti-anxiety medication.  Left pleural effusion -Noted on chest x-ray -Secondary to the above -Repeat CXR 09/29/2015: Stable moderate left pleural effusion without probable underlying atelectasis or infiltrate  Essential hypertension -Continue lisinopril and diuretics  Paroxysmal Atrial fibrillation -CHADSVASC 5 -Continue amiodarone, xarelto  Chronic kidney disease, stage III-IV -Creatinine appears at baseline, continue to monitor BMP  Depression -Continue zoloft   Normocytic anemia -Baseline hemoglobin approximately 10, currently 10.3  Dizziness -Continue meclizine PRN  Consultants None  Procedures  None  Discharge Exam: Filed Vitals:   09/29/15 2222 09/30/15 0610  BP: 99/73 111/72  Pulse: 92 72  Temp: 98.7 F (37.1 C) 97.8 F (36.6 C)  Resp: 18 18    Exam  General: Well developed, well nourished, no apparent distress  HEENT: NCAT,mucous membranes moist.   Cardiovascular: S1 S2 auscultated, irregular  Respiratory: Diminished but clear  Abdomen: Soft, nontender, nondistended, + bowel sounds  Extremities: warm dry without cyanosis clubbing. Mild LE edema.  Neuro: AAOx3, nonfocal  Psych: Pleaseant  Discharge Instructions      Discharge Instructions    Discharge instructions    Complete by:  As directed   Patient will be discharged to [facility] nursing facility with hospice to follow.  Patient will need to follow up with primary care provider within one week of discharge.  Patient should continue medications as prescribed.  Patient should follow a heart healthy diet.            Medication List    TAKE these medications  acetaminophen 500 MG tablet  Commonly known as:  TYLENOL  Take 1,000 mg by mouth 2 (two) times daily.     ALPRAZolam 0.25 MG tablet  Commonly known as:  XANAX  Take 1 tablet (0.25 mg  total) by mouth 2 (two) times daily as needed for anxiety.     amiodarone 200 MG tablet  Commonly known as:  PACERONE  Take 1 tablet (200 mg total) by mouth 2 (two) times daily.     bisacodyl 10 MG suppository  Commonly known as:  DULCOLAX  Place 10 mg rectally daily as needed for moderate constipation.     calcium-vitamin D 500-200 MG-UNIT tablet  Commonly known as:  OSCAL WITH D  Take 1 tablet by mouth 2 (two) times daily.     diclofenac sodium 1 % Gel  Commonly known as:  VOLTAREN  Apply 4 g topically 2 (two) times daily. To both knees     feeding supplement (ENSURE ENLIVE) Liqd  Take 237 mLs by mouth daily.     ferrous sulfate 325 (65 FE) MG tablet  Take 325 mg by mouth daily with breakfast.     fluticasone 50 MCG/ACT nasal spray  Commonly known as:  FLONASE  Place 2 sprays into the nose daily. For allergies     glucosamine-chondroitin 500-400 MG tablet  Take 2 tablets by mouth 2 (two) times daily. For osteoarthritis     guaiFENesin 100 MG/5ML Soln  Commonly known as:  ROBITUSSIN  Take 10 mLs by mouth every 6 (six) hours as needed for cough or to loosen phlegm.     lidocaine 5 %  Commonly known as:  LIDODERM  Place 1 patch onto the skin daily. Remove & Discard patch within 12 hours to left shoulder     lisinopril 2.5 MG tablet  Commonly known as:  PRINIVIL,ZESTRIL  Take 2.5 mg by mouth daily. Hold for SBP less than 100.     magnesium hydroxide 400 MG/5ML suspension  Commonly known as:  MILK OF MAGNESIA  Take 30 mLs by mouth daily as needed for mild constipation or moderate constipation.     meclizine 12.5 MG tablet  Commonly known as:  ANTIVERT  Take 12.5 mg by mouth 2 (two) times daily as needed for dizziness. For dizziness     oxyCODONE 5 MG immediate release tablet  Commonly known as:  Oxy IR/ROXICODONE  Take one tablet by mouth every four hours as needed for pain     polyethylene glycol packet  Commonly known as:  MIRALAX / GLYCOLAX  Take 17 g by mouth  daily. For constipation     potassium chloride 20 MEQ packet  Commonly known as:  KLOR-CON  Take 40 mEq by mouth daily.     RA SALINE ENEMA RE  Place 1 Applicatorful rectally daily as needed (constipation).     Rivaroxaban 15 MG Tabs tablet  Commonly known as:  XARELTO  Take 15 mg by mouth daily with supper. Give 1 tablet by mouth daily after evening meal for A-Fib.     senna 8.6 MG Tabs tablet  Commonly known as:  SENOKOT  Take 2 tablets by mouth at bedtime.     sertraline 100 MG tablet  Commonly known as:  ZOLOFT  Take 100 mg by mouth daily.     SYSTANE 0.4-0.3 % Soln  Generic drug:  Polyethyl Glycol-Propyl Glycol  Apply 1 drop to eye daily. Administer one drop in to each eye once daily for eye irritation.  torsemide 20 MG tablet  Commonly known as:  DEMADEX  Take 20 mg by mouth 2 (two) times daily. For congestive heart failure       Allergies  Allergen Reactions  . Codeine Other (See Comments)    Unk   Follow-up Information    Follow up with Margit Hanks, MD. Schedule an appointment as soon as possible for a visit in 1 week.   Specialty:  Internal Medicine   Why:  Hospital follow up   Contact information:   [address] [place] [address] [phone]        The results of significant diagnostics from this hospitalization (including imaging, microbiology, ancillary and laboratory) are listed below for reference.    Significant Diagnostic Studies: Dg Chest 2 View  09/29/2015  CLINICAL DATA:  Pleural effusion. EXAM: CHEST  2 VIEW COMPARISON:  September 27, 2015. FINDINGS: Stable cardiomegaly. No pneumothorax is noted. Right lung is clear. Stable moderate left pleural effusion is noted with probable underlying atelectasis or infiltrate. Degenerative change of the left glenohumeral joint is noted. Multilevel degenerative disc disease is noted in lower thoracic spine. IMPRESSION: Stable moderate left pleural effusion with probable underlying atelectasis or  infiltrate. Electronically Signed   By: Lupita Raider, M.D.   On: 09/29/2015 13:21   Dg Chest 2 View  09/27/2015  CLINICAL DATA:  Difficulty breathing/ dyspnea. Congestive heart failure. EXAM: CHEST  2 VIEW COMPARISON:  09/20/2015 FINDINGS: Moderate enlargement of the cardiopericardial silhouette with a large left pleural effusion. Left pleural effusion mildly worsened from prior. Trace right pleural effusion. Mild right perihilar atelectasis. Thoracic spondylosis. Severe degenerative glenohumeral arthropathy, left greater than right. Indistinct central pulmonary vasculature. IMPRESSION: 1. Large left pleural effusion with associated atelectasis. 2. Moderate cardiomegaly, there is potentially underlying pulmonary venous hypertension. 3. Thoracic spondylosis. 4. Degenerative glenohumeral arthropathy bilaterally. Electronically Signed   By: Gaylyn Rong M.D.   On: 09/27/2015 18:34   Dg Chest 2 View  09/20/2015  CLINICAL DATA:  Shortness of breath tonight. No chest pain. History of CHF EXAM: CHEST  2 VIEW COMPARISON:  09/23/2014 FINDINGS: Cardiac enlargement without vascular congestion. Since the previous study, there is developing bilateral pleural effusions, greater on the left, with infiltration or atelectasis in the left lung base. Changes could represent fluid overload. Superimposed pneumonia on the left is not excluded. No pneumothorax. Mediastinal contours appear intact. Degenerative changes in the spine and shoulders. IMPRESSION: Developing bilateral pleural effusions, greater on the left, with infiltration or atelectasis in the left lung base. Cardiac enlargement. Electronically Signed   By: Burman Nieves M.D.   On: 09/20/2015 23:39    Microbiology: Recent Results (from the past 240 hour(s))  MRSA PCR Screening     Status: None   Collection Time: 09/21/15  3:19 AM  Result Value Ref Range Status   MRSA by PCR NEGATIVE NEGATIVE Final    Comment:        The GeneXpert MRSA Assay  (FDA approved for NASAL specimens only), is one component of a comprehensive MRSA colonization surveillance program. It is not intended to diagnose MRSA infection nor to guide or monitor treatment for MRSA infections.      Labs: Basic Metabolic Panel:  Recent Labs Lab 09/27/15 1754 09/28/15 0451 09/29/15 0503 09/30/15 0501  NA 138 138 137 138  K 4.3 3.5 4.2 4.3  CL 103 102 102 104  CO2 27 27 27 28   GLUCOSE 103* 108* 95 92  BUN 35* 34* 37* 42*  CREATININE 1.73* 1.75* 1.85* 1.94*  CALCIUM 9.6 9.3 9.3 9.3   Liver Function Tests: No results for input(s): AST, ALT, ALKPHOS, BILITOT, PROT, ALBUMIN in the last 168 hours. No results for input(s): LIPASE, AMYLASE in the last 168 hours. No results for input(s): AMMONIA in the last 168 hours. CBC:  Recent Labs Lab 09/27/15 1754 09/29/15 0503  WBC 9.9 7.7  NEUTROABS 6.1  --   HGB 10.6* 10.3*  HCT 34.0* 32.2*  MCV 89.0 89.4  PLT 307 284   Cardiac Enzymes:  Recent Labs Lab 09/27/15 1754  TROPONINI 0.05*   BNP: BNP (last 3 results)  Recent Labs  09/20/15 2336 09/27/15 1754  BNP 2163.4* 2201.4*    ProBNP (last 3 results) No results for input(s): PROBNP in the last 8760 hours.  CBG: No results for input(s): GLUCAP in the last 168 hours.     SignedEdsel Petrin  [facility] 09/30/2015, 11:01 AM

## 2015-09-30 NOTE — Progress Notes (Signed)
[facility]-GIP RN Visit  This is a GIP, related and covered admission of 09/27/15 per Dr. Sonia Baller. This patient resides at [facility] and was just admitted to [facility] 09/27/15 with diagnosis of heart disease.  Patient seen in room, sitting up in bed. No family present at time of visit. Patient alert, oriented and with resting comfortably. Patient continues to deny any pain and stated she is no longer feeling short of breath. She has not required any PRN pain medication. Patient is on 2L [place] with O2 sat at 100% and respirations at 18. She continues to take 60 mg Lasix po BID. Patient verbalized she was able to rest all night. She has able to eat approximately 25% of her breakfast this morning. She stated she feels she would be ready for discharge soon.  Will update [facility] RN and social worker upon discharge to arrange a follow-up visit in the facility.    Please make sure patient is discharged with prescriptions for comfort medications.  [facility] will continue to follow and anticipate discharge needs. Please call with hospice-related questions or concerns.   Hessie Knows, RN, BSN [facility]  [phone]

## 2015-09-30 NOTE — Discharge Instructions (Signed)

## 2015-09-30 NOTE — Care Management Important Message (Signed)
Important Message  Patient Details  Name: Kara Wilkerson MRN: [id number] Date of Birth: 01/10/1923   Medicare Important Message Given:  Yes    Haskell Flirt 09/30/2015, 9:05 AMImportant Message  Patient Details  Name: Kara Wilkerson MRN: [id number] Date of Birth: [birth year]   Medicare Important Message Given:  Yes    Haskell Flirt 09/30/2015, 9:05 AM

## 2015-09-30 NOTE — Progress Notes (Signed)
Report called to Thayer Ohm, Charity fundraiser at [employer].  All questions answered.  Patient waiting for transport by [facility]. VSS.

## 2015-10-01 ENCOUNTER — Encounter: Payer: Self-pay | Admitting: Internal Medicine

## 2015-10-01 ENCOUNTER — Non-Acute Institutional Stay (SKILLED_NURSING_FACILITY): Payer: Medicare Other | Admitting: Internal Medicine

## 2015-10-01 DIAGNOSIS — I110 Hypertensive heart disease with heart failure: Secondary | ICD-10-CM | POA: Diagnosis not present

## 2015-10-01 DIAGNOSIS — F329 Major depressive disorder, single episode, unspecified: Secondary | ICD-10-CM | POA: Diagnosis not present

## 2015-10-01 DIAGNOSIS — F419 Anxiety disorder, unspecified: Secondary | ICD-10-CM | POA: Diagnosis not present

## 2015-10-01 DIAGNOSIS — I480 Paroxysmal atrial fibrillation: Secondary | ICD-10-CM | POA: Diagnosis not present

## 2015-10-01 DIAGNOSIS — N183 Chronic kidney disease, stage 3 unspecified: Secondary | ICD-10-CM

## 2015-10-01 DIAGNOSIS — J90 Pleural effusion, not elsewhere classified: Secondary | ICD-10-CM

## 2015-10-01 DIAGNOSIS — D649 Anemia, unspecified: Secondary | ICD-10-CM

## 2015-10-01 DIAGNOSIS — H8113 Benign paroxysmal vertigo, bilateral: Secondary | ICD-10-CM | POA: Diagnosis not present

## 2015-10-01 DIAGNOSIS — F32A Depression, unspecified: Secondary | ICD-10-CM

## 2015-10-01 DIAGNOSIS — I5043 Acute on chronic combined systolic (congestive) and diastolic (congestive) heart failure: Secondary | ICD-10-CM | POA: Diagnosis not present

## 2015-10-01 NOTE — Progress Notes (Signed)
MRN: [id number] Name: Kara Wilkerson  Sex: female Age: 82 y.o. DOB: 25-Aug-1922  [facility] #:  Facility/Room:[facility]/311-W Level Of Care: SNF Provider:Soua Lenk MD  Emergency Contacts: Extended Emergency Contact Information Primary Emergency Contact: Huntely,Douschkas Address: [address] [place], [place] [address] D[place] of [place] Home Phone: [phone] Relation: Niece Secondary Emergency Contact: [facility] Address: [address] [place], [place] [address] [place] of [place] Mobile Phone: [phone] Relation: Other  Code Status:   Allergies: Codeine  Chief Complaint  Patient presents with  . New Admit To SNF    HPI: Patient is 82 y.o. female with history of CHF, HTN, CKD, Afib who came with cc of dyspnea for a few weeks but worsening over the past two days. She also has orthopnea, PND and LE edema. She was discharged last week after admission with similar symptoms for acute HF. She denied any chest pain. She had couple of episodes of vomiting two days ago but none afterwards. She has no nausea or abdominal pain today. No fever/chills or cough. No other complaints. Pt was admitted to [facility] from 6/19-21 for acute on chronic CHF. PT in fact has endstage CHF and is very anxious about that.Pt is admitted back to SNF for residential and comfort care. While at SNF pt will be followed for AF, tx with amiodarone and xarelto, HTN, tx with demadex and lisinopril and anemia, tx with iron.   Past Medical History  Diagnosis Date  . CHF (congestive heart failure) (HCC)   . A-fib (HCC)   . Hypertension   . Constipation   . Arthritis   . Chronic systolic heart failure (HCC)   . Dependence on supplemental oxygen   . Other chronic pain   . Contracture of ankle and foot joint   . Muscle weakness (generalized)   . Lack of coordination   . Unspecified essential hypertension   . Edema   . GERD (gastroesophageal reflux disease)   .  Hyperlipidemia   . Depression   . Chronic kidney disease     Stage III-IV    Past Surgical History  Procedure Laterality Date  . Right ankle orif    . Fracture surgery        Medication List       This list is accurate as of: 10/01/15 11:59 PM.  Always use your most recent med list.               acetaminophen 500 MG tablet  Commonly known as:  TYLENOL  Take 1,000 mg by mouth 2 (two) times daily.     ALPRAZolam 0.25 MG tablet  Commonly known as:  XANAX  Take 1 tablet (0.25 mg total) by mouth 2 (two) times daily as needed for anxiety.     amiodarone 200 MG tablet  Commonly known as:  PACERONE  Take 1 tablet (200 mg total) by mouth 2 (two) times daily.     bisacodyl 10 MG suppository  Commonly known as:  DULCOLAX  Place 10 mg rectally daily as needed for moderate constipation.     calcium-vitamin D 500-200 MG-UNIT tablet  Commonly known as:  OSCAL WITH D  Take 1 tablet by mouth 2 (two) times daily.     diclofenac sodium 1 % Gel  Commonly known as:  VOLTAREN  Apply 4 g topically 2 (two) times daily. To both knees     feeding supplement (  ENSURE ENLIVE) Liqd  Take 237 mLs by mouth daily.     ferrous sulfate 325 (65 FE) MG tablet  Take 325 mg by mouth daily with breakfast.     fluticasone 50 MCG/ACT nasal spray  Commonly known as:  FLONASE  Place 2 sprays into the nose daily. For allergies     glucosamine-chondroitin 500-400 MG tablet  Take 2 tablets by mouth 2 (two) times daily. For osteoarthritis     guaiFENesin 100 MG/5ML Soln  Commonly known as:  ROBITUSSIN  Take 10 mLs by mouth every 6 (six) hours as needed for cough or to loosen phlegm.     lidocaine 5 %  Commonly known as:  LIDODERM  Place 1 patch onto the skin daily. Remove & Discard patch within 12 hours to left shoulder     lisinopril 2.5 MG tablet  Commonly known as:  PRINIVIL,ZESTRIL  Take 2.5 mg by mouth daily. Hold for SBP less than 100.     magnesium hydroxide 400 MG/5ML suspension   Commonly known as:  MILK OF MAGNESIA  Take 30 mLs by mouth daily as needed for mild constipation or moderate constipation.     meclizine 12.5 MG tablet  Commonly known as:  ANTIVERT  Take 12.5 mg by mouth 2 (two) times daily as needed for dizziness. For dizziness     oxyCODONE 5 MG immediate release tablet  Commonly known as:  Oxy IR/ROXICODONE  Take one tablet by mouth every four hours as needed for pain     polyethylene glycol packet  Commonly known as:  MIRALAX / GLYCOLAX  Take 17 g by mouth daily. For constipation     potassium chloride 20 MEQ packet  Commonly known as:  KLOR-CON  Take 40 mEq by mouth daily.     RA SALINE ENEMA RE  Place 1 Applicatorful rectally daily as needed (constipation).     Rivaroxaban 15 MG Tabs tablet  Commonly known as:  XARELTO  Take 15 mg by mouth daily with supper. Give 1 tablet by mouth daily after evening meal for A-Fib.     senna 8.6 MG Tabs tablet  Commonly known as:  SENOKOT  Take 2 tablets by mouth at bedtime.     sertraline 100 MG tablet  Commonly known as:  ZOLOFT  Take 100 mg by mouth daily.     SYSTANE 0.4-0.3 % Soln  Generic drug:  Polyethyl Glycol-Propyl Glycol  Apply 1 drop to eye daily. Administer one drop in to each eye once daily for eye irritation.     torsemide 20 MG tablet  Commonly known as:  DEMADEX  Take 20 mg by mouth 2 (two) times daily. For congestive heart failure        No orders of the defined types were placed in this encounter.    Immunization History  Administered Date(s) Administered  . Influenza Whole 01/22/2013  . Influenza-Unspecified 01/11/2012, 01/20/2014, 01/04/2015  . PPD Test 11/20/2011    Social History  Substance Use Topics  . Smoking status: Never Smoker   . Smokeless tobacco: Never Used  . Alcohol Use: No    Family history is   Family History  Problem Relation Age of Onset  . Diabetes Mother   . Hypertension Mother   . Cancer Brother       Review of Systems  DATA  OBTAINED: from patient, nurse GENERAL:  no fevers, fatigue, appetite changes SKIN: No itching, rash or wounds EYES: No eye pain, redness, discharge EARS: No earache,  tinnitus, change in hearing NOSE: No congestion, drainage or bleeding  MOUTH/THROAT: No mouth or tooth pain, No sore throat RESPIRATORY: No cough, wheezing, SOB CARDIAC: No chest pain, palpitations, lower extremity edema  GI: No abdominal pain, No N/V/D or constipation, No heartburn or reflux  GU: No dysuria, frequency or urgency, or incontinence  MUSCULOSKELETAL: No unrelieved bone/joint pain NEUROLOGIC: No headache, dizziness or focal weakness PSYCHIATRIC: + anxiety    Filed Vitals:   10/01/15 1024  BP: 111/72  Pulse: 72  Temp: 97.8 F (36.6 C)  Resp: 18    SpO2 Readings from Last 1 Encounters:  09/30/15 97%        Physical Exam  GENERAL APPEARANCE: Alert, conversant,  No acute distress.  SKIN: No diaphoresis rash HEAD: Normocephalic, atraumatic  EYES: Conjunctiva/lids clear. Pupils round, reactive. EOMs intact.  EARS: External exam WNL, canals clear. Hearing grossly normal.  NOSE: No deformity or discharge.  MOUTH/THROAT: Lips w/o lesions  RESPIRATORY: Breathing is even, unlabored. Lung sounds are clear; wearing O2 CARDIOVASCULAR: Heart RRR no murmurs, rubs or gallops. trace peripheral edema.   GASTROINTESTINAL: Abdomen is soft, non-tender, not distended w/ normal bowel sounds. GENITOURINARY: Bladder non tender, not distended  MUSCULOSKELETAL: No abnormal joints or musculature NEUROLOGIC:  Cranial nerves 2-12 grossly intact. Moves all extremities  PSYCHIATRIC: Mood and affect appropriate to situation, no behavioral issues  Patient Active Problem List   Diagnosis Date Noted  . Pleural effusion 10/03/2015  . Anxiety 10/03/2015  . Benign paroxysmal positional vertigo 10/02/2015  . Encounter for family conference with patient present 10/02/2015  . Acute on chronic congestive heart failure (HCC)   .  CHF exacerbation (HCC) 09/27/2015  . Acute HF (heart failure) (HCC) 09/27/2015  . UTI (urinary tract infection) 09/22/2015  . Atrial fibrillation with RVR (HCC) 09/22/2015  . Acute on chronic combined systolic (congestive) and diastolic (congestive) heart failure (HCC) 09/21/2015  . DNR (do not resuscitate)   . Congestive dilated cardiomyopathy (HCC) 09/03/2015  . CKD (chronic kidney disease) stage 3, GFR 30-59 ml/min 07/24/2015  . Hot flashes 06/17/2015  . Cough 05/24/2015  . Syncope 11/30/2014  . FTT (failure to thrive) in adult 09/29/2014  . Palliative care encounter   . Shortness of breath   . Symptomatic bradycardia 09/24/2014  . Acute renal failure superimposed on stage 4 chronic kidney disease (HCC) 01/09/2014  . Edema 09/03/2013  . Anemia 09/03/2013  . Depression 08/13/2013  . Vitamin D deficiency 08/13/2013  . Paroxysmal atrial fibrillation (HCC) 10/16/2012  . Hypertensive heart disease with CHF (congestive heart failure) (HCC) 10/16/2012  . Chronic combined systolic and diastolic CHF (congestive heart failure) (HCC) 10/16/2012  . Unspecified constipation 10/16/2012  . Osteoarthritis 10/16/2012  . Other malaise and fatigue 10/16/2012  . Incomplete tear of rotator cuff 09/30/2012       Component Value Date/Time   WBC 7.7 09/29/2015 0503   WBC 7.7 09/29/2015   RBC 3.60* 09/29/2015 0503   HGB 10.3* 09/29/2015 0503   HCT 32.2* 09/29/2015 0503   PLT 284 09/29/2015 0503   MCV 89.4 09/29/2015 0503   LYMPHSABS 2.9 09/27/2015 1754   MONOABS 0.8 09/27/2015 1754   EOSABS 0.2 09/27/2015 1754   BASOSABS 0.0 09/27/2015 1754        Component Value Date/Time   NA 138 09/30/2015 0501   NA 138 09/30/2015   K 4.3 09/30/2015 0501   CL 104 09/30/2015 0501   CO2 28 09/30/2015 0501   GLUCOSE 92 09/30/2015 0501   BUN 42*  09/30/2015 0501   BUN 42* 09/30/2015   CREATININE 1.94* 09/30/2015 0501   CREATININE 1.9* 09/30/2015   CALCIUM 9.3 09/30/2015 0501   PROT 7.1 09/24/2014  0550   ALBUMIN 3.3* 09/24/2014 0550   AST 11* 08/09/2015   ALT 10 08/09/2015   ALKPHOS 78 08/09/2015   BILITOT 1.2 09/24/2014 0550   GFRNONAA 22* 09/30/2015 0501   GFRAA 25* 09/30/2015 0501    No results found for: HGBA1C  No results found for: CHOL, HDL, LDLCALC, LDLDIRECT, TRIG, CHOLHDL   Dg Chest 2 View  09/27/2015  CLINICAL DATA:  Difficulty breathing/ dyspnea. Congestive heart failure. EXAM: CHEST  2 VIEW COMPARISON:  09/20/2015 FINDINGS: Moderate enlargement of the cardiopericardial silhouette with a large left pleural effusion. Left pleural effusion mildly worsened from prior. Trace right pleural effusion. Mild right perihilar atelectasis. Thoracic spondylosis. Severe degenerative glenohumeral arthropathy, left greater than right. Indistinct central pulmonary vasculature. IMPRESSION: 1. Large left pleural effusion with associated atelectasis. 2. Moderate cardiomegaly, there is potentially underlying pulmonary venous hypertension. 3. Thoracic spondylosis. 4. Degenerative glenohumeral arthropathy bilaterally. Electronically Signed   By: Gaylyn Rong M.D.   On: 09/27/2015 18:34    Not all labs, radiology exams or other studies done during hospitalization come through on my EPIC note; however they are reviewed by me.    Assessment and Plan  Dyspnea secondary to Acute systolic/diastolic heart failure -BNP 2201 -Echocardiogram June 2016 showed an EF of 20%, grade 1 diastolic dysfunction -recently admitted and discharged on 09/22/2015 -Patient was given IV Lasix and transitioned to by mouth Lasix twice a day -Monitor intake and output, daily weights -output not accurate as patient does have some incontinence and refused foley SNF -cont  torsemide Continue lisinopril   Left pleural effusion -Noted on chest x-ray -Secondary to the above -Repeat CXR 09/29/2015: Stable moderate left pleural effusion without probable underlying atelectasis or infiltrate SNF - no  action;monitor  Hypertension SNF - -Continue lisinopril and demadex  Paroxysmal Atrial fibrillation -CHADSVASC 5 SNF -Continue amiodarone, xarelto  Chronic kidney disease, stage III-IV SNF --Creatinine appears at baseline, f/u  BMP  Depression SNF -Continue zoloft   Normocytic anemia -Baseline hemoglobin approximately 10, currently 10.3 SNF - f/u CBC  Dizziness/Vertigo SNF-Continue meclizine PRN  ANXIETY SNF - this is a big problem for the pt; have written for xanax 0.25 mg TID scheduled;tis comes from pt's fear/non acceptance that she has a terminal dx   Time spent > 45 min;> 50% of time with patient was spent reviewing records, labs, tests and studies, counseling and developing plan of care  Merrilee Seashore  MD

## 2015-10-02 ENCOUNTER — Encounter: Payer: Self-pay | Admitting: Internal Medicine

## 2015-10-02 DIAGNOSIS — H8110 Benign paroxysmal vertigo, unspecified ear: Secondary | ICD-10-CM | POA: Insufficient documentation

## 2015-10-02 DIAGNOSIS — Z7189 Other specified counseling: Secondary | ICD-10-CM | POA: Insufficient documentation

## 2015-10-02 NOTE — Assessment & Plan Note (Signed)
Spoke with pt's niece , who is POA. I confirmed that pt's CHF was end stage, has been for a year but she has done so well until now. We discussed keeping her spirits up. Niece would like Hospice to follow, but she has prepared her for this. Numerous points were touched on as well.

## 2015-10-03 ENCOUNTER — Encounter: Payer: Self-pay | Admitting: Internal Medicine

## 2015-10-03 DIAGNOSIS — J90 Pleural effusion, not elsewhere classified: Secondary | ICD-10-CM | POA: Insufficient documentation

## 2015-10-03 DIAGNOSIS — F419 Anxiety disorder, unspecified: Secondary | ICD-10-CM | POA: Insufficient documentation

## 2015-10-05 LAB — BASIC METABOLIC PANEL
BUN: 35 mg/dL — AB (ref 4–21)
Creatinine: 1.6 mg/dL — AB (ref 0.5–1.1)
GLUCOSE: 107 mg/dL
Potassium: 4.7 mmol/L (ref 3.4–5.3)
SODIUM: 137 mmol/L (ref 137–147)

## 2015-10-05 LAB — CBC AND DIFFERENTIAL
HEMATOCRIT: 36 % (ref 36–46)
HEMOGLOBIN: 11.5 g/dL — AB (ref 12.0–16.0)
PLATELETS: 310 10*3/uL (ref 150–399)
WBC: 7.9 10^3/mL

## 2015-11-01 ENCOUNTER — Non-Acute Institutional Stay (SKILLED_NURSING_FACILITY): Payer: Medicare Other | Admitting: Internal Medicine

## 2015-11-01 ENCOUNTER — Encounter: Payer: Self-pay | Admitting: Internal Medicine

## 2015-11-01 DIAGNOSIS — D638 Anemia in other chronic diseases classified elsewhere: Secondary | ICD-10-CM | POA: Diagnosis not present

## 2015-11-01 DIAGNOSIS — F419 Anxiety disorder, unspecified: Secondary | ICD-10-CM | POA: Diagnosis not present

## 2015-11-01 DIAGNOSIS — I5042 Chronic combined systolic (congestive) and diastolic (congestive) heart failure: Secondary | ICD-10-CM | POA: Diagnosis not present

## 2015-11-01 DIAGNOSIS — N179 Acute kidney failure, unspecified: Secondary | ICD-10-CM

## 2015-11-01 DIAGNOSIS — I4891 Unspecified atrial fibrillation: Secondary | ICD-10-CM | POA: Diagnosis not present

## 2015-11-01 DIAGNOSIS — N184 Chronic kidney disease, stage 4 (severe): Secondary | ICD-10-CM

## 2015-11-01 NOTE — Progress Notes (Signed)
Location:   [facility] Room Number: 311/W Place of Service:  SNF [phone]) Provider:  Juanetta Beets, MD  Patient Care Team: Margit Hanks, MD as PCP - General (Internal Medicine)  Extended Emergency Contact Information Primary Emergency Contact: Huntely,Douschkas Address: [address] [place], [place] [address] Darden Amber of [place] Home Phone: [phone] Relation: Niece Secondary Emergency Contact: [facility] Address: [address] [place], [place] [address] Darden Amber of [place] Mobile Phone: [phone] Relation: Other  Code Status:  DNR Goals of care: Advanced Directive information Advanced Directives 11/01/2015  Does patient have an advance directive? Yes  Type of Advance Directive Out of facility DNR (pink MOST or yellow form)  Does patient want to make changes to advanced directive? No - Patient declined  Copy of advanced directive(s) in chart? Yes  Would patient like information on creating an advanced directive? -  Pre-existing out of facility DNR order (yellow form or pink MOST form) -     Chief Complaint  Patient presents with  . Medical Management of Chronic Issues    Routine visit    HPI:  Pt is a 82 y.o. female seen today for medical management of chronic diseasesIncluding congestive heart failure-atrial fibrillation-chronic kidney disease-anemia-depression- She is now under hospice services secondary to failure to thrive and end-stage CHF-.  She has been hospitalized recently for increased shortness of breath and was given aggressive diuresis-.  However her clinical status continues to be extremely fragile and declining and she is now essentially under comfort care and followed by hospice.  She is receiving Xanax for anxiety 3 times a day and apparently per hospice nurse I spoke with today this is effective she also has a twice a day dosing twice a day as needed.  Tonight  she has no acute complaints other than stating her eyes feel stuck together at times in the mornings.  Does not complain of any acute shortness of breath chest pain she is resting in bed comfortably-reporting increased weakness when she tries to sit up or ambulate.   She does have a history of age fibrillation at this point appears rate controlled on amiodarone drawn she is on Xarelto for anticoagulation.  In regards congestive heart failure she does continue on Demadex as well as lisinopril.  Edema appears to be relatively baseline this evening.  She also has a history of chronic kidney disease most recent creatinine was 1.6 on 10/05/2015 labs have been minimized secondary to her hospice status but clinically appears to be stable.     Past Medical History:  Diagnosis Date  . A-fib (HCC)   . Arthritis   . CHF (congestive heart failure) (HCC)   . Chronic kidney disease    Stage III-IV  . Chronic systolic heart failure (HCC)   . Constipation   . Contracture of ankle and foot joint   . Dependence on supplemental oxygen   . Depression   . Edema   . GERD (gastroesophageal reflux disease)   . Hyperlipidemia   . Hypertension   . Lack of coordination   . Muscle weakness (generalized)   . Other chronic pain   . Unspecified essential hypertension    Past Surgical History:  Procedure Laterality Date  . FRACTURE SURGERY    . right ankle orif      Allergies  Allergen Reactions  . Codeine Other (See  Comments)    Unk      Medication List       Accurate as of 11/01/15  4:00 PM. Always use your most recent med list.          acetaminophen 500 MG tablet Commonly known as:  TYLENOL Take 1,000 mg by mouth 2 (two) times daily.   ALPRAZolam 0.25 MG tablet Commonly known as:  XANAX Take 0.25 mg by mouth 3 (three) times daily.   ALPRAZolam 0.25 MG tablet Commonly known as:  XANAX Take 1 tablet (0.25 mg total) by mouth 2 (two) times daily as needed for anxiety.     amiodarone 200 MG tablet Commonly known as:  PACERONE Take 1 tablet (200 mg total) by mouth 2 (two) times daily.   bisacodyl 10 MG suppository Commonly known as:  DULCOLAX Place 10 mg rectally daily as needed for moderate constipation.   calcium-vitamin D 500-200 MG-UNIT tablet Commonly known as:  OSCAL WITH D Take 1 tablet by mouth 2 (two) times daily.   diclofenac sodium 1 % Gel Commonly known as:  VOLTAREN Apply 4 g topically 2 (two) times daily. To both knees   feeding supplement (ENSURE ENLIVE) Liqd Take 237 mLs by mouth daily.   ferrous sulfate 325 (65 FE) MG tablet Take 325 mg by mouth daily with breakfast.   fluticasone 50 MCG/ACT nasal spray Commonly known as:  FLONASE Place 2 sprays into the nose daily. For allergies   glucosamine-chondroitin 500-400 MG tablet Take 2 tablets by mouth 2 (two) times daily. For osteoarthritis   guaiFENesin 100 MG/5ML Soln Commonly known as:  ROBITUSSIN Take 10 mLs by mouth every 6 (six) hours as needed for cough or to loosen phlegm.   lidocaine 5 % Commonly known as:  LIDODERM Place 1 patch onto the skin daily. Remove & Discard patch within 12 hours to left shoulder   lisinopril 2.5 MG tablet Commonly known as:  PRINIVIL,ZESTRIL Take 2.5 mg by mouth daily. Hold for SBP less than 100.   magnesium hydroxide 400 MG/5ML suspension Commonly known as:  MILK OF MAGNESIA Take 30 mLs by mouth daily as needed for mild constipation or moderate constipation.   meclizine 12.5 MG tablet Commonly known as:  ANTIVERT Take 12.5 mg by mouth 2 (two) times daily as needed for dizziness. For dizziness   oxyCODONE 5 MG immediate release tablet Commonly known as:  Oxy IR/ROXICODONE Take one tablet by mouth every four hours as needed for pain   polyethylene glycol packet Commonly known as:  MIRALAX / GLYCOLAX Take 17 g by mouth daily. For constipation   potassium chloride 20 MEQ packet Commonly known as:  KLOR-CON Take 40 mEq by mouth  daily.   RA SALINE ENEMA RE Place 1 Applicatorful rectally daily as needed (constipation).   Rivaroxaban 15 MG Tabs tablet Commonly known as:  XARELTO Take 15 mg by mouth daily with supper. Give 1 tablet by mouth daily after evening meal for A-Fib.   senna 8.6 MG Tabs tablet Commonly known as:  SENOKOT Take 2 tablets by mouth at bedtime.   sertraline 100 MG tablet Commonly known as:  ZOLOFT Take 100 mg by mouth daily.   SYSTANE 0.4-0.3 % Soln Generic drug:  Polyethyl Glycol-Propyl Glycol Administer one drop in to each eye once daily for eye irritation.   torsemide 20 MG tablet Commonly known as:  DEMADEX Take 20 mg by mouth 2 (two) times daily. For congestive heart failure       Review of  Systems  DATA OBTAINED: from patient, nurse GENERAL:  no fevers, fatigue, appetite changes SKIN: No itching, rash or wounds EYES: Complains of some matting of her eyes in the morning and some eye irritation EARS: No earache, tinnitus, change in hearing NOSE: No congestion, drainage or bleeding  MOUTH/THROAT: No mouth or tooth pain, No sore throat RESPIRATORY: No cough, wheezing, SOB CARDIAC: No chest pain, palpitations appears to have baseline, lower extremity edema  GI: No abdominal pain, No N/V/D or constipation, No heartburn or reflux  GU: No dysuria, frequency or urgency, or incontinence  MUSCULOSKELETAL: No unrelieved bone/joint pain NEUROLOGIC: No headache, dizziness or focal weakness PSYCHIATRIC: + anxiety    Immunization History  Administered Date(s) Administered  . Influenza Whole 01/22/2013  . Influenza-Unspecified 01/11/2012, 01/20/2014, 01/04/2015  . PPD Test 11/20/2011   Pertinent  Health Maintenance Due  Topic Date Due  . DEXA SCAN  04/10/2016 (Originally 04/03/1990)  . PNA vac Low Risk Adult (1 of 2 - PCV13) 04/11/2023 (Originally 04/03/1990)  . INFLUENZA VACCINE  11/09/2015   No flowsheet data found. Functional Status Survey:    Vitals:   11/01/15 1541    BP: (!) 108/49  Pulse: 77  Resp: 18  Temp: 98.2 F (36.8 C)  TempSrc: Oral  SpO2: 97%    Physical Exam   GENERAL APPEARANCE: Alert, conversant,  No acute distress. Lying in bed comfortably SKIN: No diaphoresis rash HEAD: Normocephalic, atraumatic  EYES: History of right eye blindness-left eye  visual acuity appears intact-slightly erythematous conjunctivae appears to have some watery drainage bilaterally EARS: External exam WNL, canals clear. Hearing grossly normal.  NOSE: No deformity or discharge.  MOUTH/THROAT: Lips w/o lesions  RESPIRATORY: Breathing is even, unlabored with somewhat shallow air entry. Lung sounds are clear; wearing O2 CARDIOVASCULAR: Heart RRR no murmurs, rubs or gallops. Trace--one plus  peripheral edema-her legs are elevated.   GASTROINTESTINAL: Abdomen is soft, non-tender, not distended w/ normal bowel sounds. GENITOURINARY: Bladder non tender, not distended  MUSCULOSKELETAL: No abnormal joints or musculature but has generalized weakness appears to be somewhat increasingly frail NEUROLOGIC:  Cranial nerves 2-12 grossly intact. Moves all extremities  PSYCHIATRIC: Mood and affect appropriate to situation, no behavioral issues pleasant and cooperative which is her normal presentation-does not appear overtly anxious this evening  Labs reviewed:  Recent Labs  09/28/15 0451 09/29/15 0503 09/30/15 09/30/15 0501 10/05/15  NA 138 137 138 138 137  K 3.5 4.2 4.3 4.3 4.7  CL 102 102  --  104  --   CO2 27 27  --  28  --   GLUCOSE 108* 95  --  92  --   BUN 34* 37* 42* 42* 35*  CREATININE 1.75* 1.85* 1.9* 1.94* 1.6*  CALCIUM 9.3 9.3  --  9.3  --     Recent Labs  05/25/15 08/09/15  AST 14 11*  ALT 8 10  ALKPHOS 94 78    Recent Labs  09/22/15 0303 09/27/15 1754 09/29/15 09/29/15 0503 10/05/15  WBC 7.9 9.9 7.7 7.7 7.9  NEUTROABS  --  6.1  --   --   --   HGB 10.0* 10.6*  --  10.3* 11.5*  HCT 32.4* 34.0*  --  32.2* 36  MCV 87.6 89.0  --  89.4  --    PLT 260 307  --  284 310   Lab Results  Component Value Date   TSH 1.67 06/23/2015   No results found for: HGBA1C No results found for: CHOL, HDL, LDLCALC,  LDLDIRECT, TRIG, CHOLHDL  Significant Diagnostic Results in last 30 days:  No results found.  Assessment/Plan  1 history of diastolic systolic CHF this is been progressive echocardiogram in June 2016 showed ejection fraction of 20% with grade 1 diastolic dysfunction she continues on Demadex 20 mg twice a day clinical status appears to be very frail but stable at this point she does not appear to be in any discomfort-again she does have a very guarded prognosis and is under hospice care but at this point appears to be stable she is increasingly weak which is to be expected continue supportive care.  #2 hypertension at this point appears stable on lisinopril and Demadex.  #3 atrial fibrillation appears to be rate controlled on amiodarone she is on Xarelto for anticoagulation.  #4 history of chronic kidney disease recent creatinine of 1.6 on 10/05/2015 appears to be relatively baseline again will not be aggressive pursuing labs secondary to patient being under hospice services with desires for quite limited interventions  #5 history of depression she is on Zoloft this appears relatively stable patient previously has expressed concern about wanting to go home-she actually expressed this desire again this evening-I did speak with her about this about her need for increased care with her weakness and that there would be more support here she seemed to expressed understanding.  #6 history of anxiety again she is on Xanax A times daily routine as well as twice a day when necessary-this appears under better control from what I see tonight appears to be somewhat less anxious than I have seen her in the past appears to be called and comfortable and this is the goal.  #7 history of dizziness or vertigo she has when necessary Antivert as needed is  not complaining of that this evening.  #8 question conjunctivitis-she does have some watery drainage and irritation reports some crusting in the a.m. although unclear the consistency of the crusting-will do a trial course of Patanol eyedrops 1 drop twice a day to both eyes for 7 days and monitor.  #9 history of anemia she continues on iron hemoglobin of 11.5 on lab done 10/05/2015 appears to be stable.  #10 pain management she is on OxyIR 5 mg every 4 hours as needed-she appears comfortable tonight-pain does not appear to be an issue at this point but will have to be monitored.  It appears the Xanax is helping significantly with her anxiety in complaints of discomfort.  [id number] note greater than 35 minutes spent assessing patient-reviewing her multiple medical issues-reviewing her chart-labs-as well as discussion with hospice nurse-and coordinating in formulating a plan of care-of note greater than 50% of time spent coordinating plan of care with input as noted above

## 2015-11-05 ENCOUNTER — Encounter: Payer: Self-pay | Admitting: Internal Medicine

## 2015-11-05 ENCOUNTER — Non-Acute Institutional Stay (SKILLED_NURSING_FACILITY): Payer: Medicare Other | Admitting: Internal Medicine

## 2015-11-05 DIAGNOSIS — F419 Anxiety disorder, unspecified: Secondary | ICD-10-CM | POA: Diagnosis not present

## 2015-11-05 DIAGNOSIS — I5032 Chronic diastolic (congestive) heart failure: Secondary | ICD-10-CM | POA: Diagnosis not present

## 2015-11-05 NOTE — Progress Notes (Signed)
MRN: [id number] Name: Kara Wilkerson  Sex: female Age: 82 y.o. DOB: 1922/11/01  PSC #:  Facility/Room: [facility] / [address] Level Of Care: SNF Provider: Randon Goldsmith. Lyn Hollingshead, MD Emergency Contacts: Extended Emergency Contact Information Primary Emergency Contact: Huntely,Douschkas Address: [address] [place], [place] [address] Darden [place] of [place] Home Phone: [phone] Relation: Niece Secondary Emergency Contact: [facility] Address: [address] [place], [place] [address] D[place] of [place] Phone: [phone] Relation: Other  Code Status: DNR  Allergies: Codeine  Chief Complaint  Patient presents with  . Acute Visit    Acute     HPI: Patient is 82 y.o. female who Hospice asked me to see for anxiety related to her SOB. [name] is very specific about what she wants.   Past Medical History:  Diagnosis Date  . A-fib (HCC)   . Arthritis   . CHF (congestive heart failure) (HCC)   . Chronic kidney disease    Stage III-IV  . Chronic systolic heart failure (HCC)   . Constipation   . Contracture of ankle and foot joint   . Dependence on supplemental oxygen   . Depression   . Edema   . GERD (gastroesophageal reflux disease)   . Hyperlipidemia   . Hypertension   . Lack of coordination   . Muscle weakness (generalized)   . Other chronic pain   . Unspecified essential hypertension     Past Surgical History:  Procedure Laterality Date  . FRACTURE SURGERY    . right ankle orif        Medication List       Accurate as of 11/05/15  1:47 PM. Always use your most recent med list.          acetaminophen 500 MG tablet Commonly known as:  TYLENOL Take 1,000 mg by mouth 2 (two) times daily.   ALPRAZolam 0.25 MG tablet Commonly known as:  XANAX Take 0.25 mg by mouth 3 (three) times daily.   ALPRAZolam 0.25 MG tablet Commonly known as:  XANAX Take 1 tablet (0.25 mg total) by mouth 2 (two) times daily as  needed for anxiety.   amiodarone 200 MG tablet Commonly known as:  PACERONE Take 1 tablet (200 mg total) by mouth 2 (two) times daily.   bisacodyl 10 MG suppository Commonly known as:  DULCOLAX Place 10 mg rectally daily as needed for moderate constipation.   calcium-vitamin D 500-200 MG-UNIT tablet Commonly known as:  OSCAL WITH D Take 1 tablet by mouth 2 (two) times daily.   diclofenac sodium 1 % Gel Commonly known as:  VOLTAREN Apply 4 g topically 2 (two) times daily. To both knees   feeding supplement (ENSURE ENLIVE) Liqd Take 237 mLs by mouth daily.   ferrous sulfate 325 (65 FE) MG tablet Take 325 mg by mouth daily with breakfast.   fluticasone 50 MCG/ACT nasal spray Commonly known as:  FLONASE Place 2 sprays into the nose daily. For allergies   glucosamine-chondroitin 500-400 MG tablet Take 2 tablets by mouth 2 (two) times daily. For osteoarthritis   guaiFENesin 100 MG/5ML Soln Commonly known as:  ROBITUSSIN Take 10 mLs by mouth every 6 (six) hours as needed for cough or to loosen phlegm.   lidocaine 5 % Commonly known as:  LIDODERM Place 1 patch onto the skin daily. Remove & Discard patch within 12 hours to left shoulder  lisinopril 2.5 MG tablet Commonly known as:  PRINIVIL,ZESTRIL Take 2.5 mg by mouth daily. Hold for SBP less than 100.   magnesium hydroxide 400 MG/5ML suspension Commonly known as:  MILK OF MAGNESIA Take 30 mLs by mouth daily as needed for mild constipation or moderate constipation.   meclizine 12.5 MG tablet Commonly known as:  ANTIVERT Take 12.5 mg by mouth 2 (two) times daily as needed for dizziness. For dizziness   oxyCODONE 5 MG immediate release tablet Commonly known as:  Oxy IR/ROXICODONE Take one tablet by mouth every four hours as needed for pain   polyethylene glycol packet Commonly known as:  MIRALAX / GLYCOLAX Take 17 g by mouth daily. For constipation   potassium chloride 20 MEQ packet Commonly known as:   KLOR-CON Take 40 mEq by mouth daily.   RA SALINE ENEMA RE Place 1 Applicatorful rectally daily as needed (constipation).   Rivaroxaban 15 MG Tabs tablet Commonly known as:  XARELTO Take 15 mg by mouth daily with supper. Give 1 tablet by mouth daily after evening meal for A-Fib.   senna 8.6 MG Tabs tablet Commonly known as:  SENOKOT Take 2 tablets by mouth at bedtime.   sertraline 100 MG tablet Commonly known as:  ZOLOFT Take 100 mg by mouth daily.   SYSTANE 0.4-0.3 % Soln Generic drug:  Polyethyl Glycol-Propyl Glycol Administer one drop in to each eye once daily for eye irritation.   torsemide 20 MG tablet Commonly known as:  DEMADEX Take 20 mg by mouth 2 (two) times daily. For congestive heart failure       No orders of the defined types were placed in this encounter.   Immunization History  Administered Date(s) Administered  . Influenza Whole 01/22/2013  . Influenza-Unspecified 01/11/2012, 01/20/2014, 01/04/2015  . PPD Test 11/20/2011    Social History  Substance Use Topics  . Smoking status: Never Smoker  . Smokeless tobacco: Never Used  . Alcohol use No    Review of Systems  DATA OBTAINED: from patient, Hospice nurse GENERAL:  no fevers, fatigue, appetite changes, feels pretty good today SKIN: No itching, rash HEENT: No complaint RESPIRATORY: No cough, wheezing, no more SOB than usual CARDIAC: No chest pain, palpitations, lower extremity edema  GI: No abdominal pain, No N/V/D or constipation, No heartburn or reflux  GU: No dysuria, frequency or urgency, or incontinence  MUSCULOSKELETAL: No unrelieved bone/joint pain NEUROLOGIC: No headache, dizziness  PSYCHIATRIC: No overt anxiety or sadness  Vitals:   11/05/15 1343  BP: 105/74  Pulse: 73  Resp: 16  Temp: 97.9 F (36.6 C)    Physical Exam  GENERAL APPEARANCE: Alert, conversant, No acute distress  SKIN: No diaphoresis rash HEENT: Unremarkable RESPIRATORY: Breathing is even, unlabored. Lung  sounds are clear but decreased; wearing o2 , seems more comfortable than last I saw her   CARDIOVASCULAR: Heart RRR no murmurs, rubs or gallops. No peripheral edema  GASTROINTESTINAL: Abdomen is soft, non-tender, not distended w/ normal bowel sounds.  GENITOURINARY: Bladder non tender, not distended  MUSCULOSKELETAL: No abnormal joints or musculature NEUROLOGIC: Cranial nerves 2-12 grossly intact. Moves all extremities PSYCHIATRIC: Mood and affect appropriate to situation, no behavioral issues  Patient Active Problem List   Diagnosis Date Noted  . Pleural effusion 10/03/2015  . Anxiety 10/03/2015  . Benign paroxysmal positional vertigo 10/02/2015  . Encounter for family conference with patient present 10/02/2015  . Acute on chronic congestive heart failure (HCC)   . CHF exacerbation (HCC) 09/27/2015  . Acute  HF (heart failure) (HCC) 09/27/2015  . UTI (urinary tract infection) 09/22/2015  . Atrial fibrillation with RVR (HCC) 09/22/2015  . Acute on chronic combined systolic (congestive) and diastolic (congestive) heart failure (HCC) 09/21/2015  . DNR (do not resuscitate)   . Congestive dilated cardiomyopathy (HCC) 09/03/2015  . Hot flashes 06/17/2015  . Cough 05/24/2015  . Syncope 11/30/2014  . FTT (failure to thrive) in adult 09/29/2014  . Palliative care encounter   . Shortness of breath   . Symptomatic bradycardia 09/24/2014  . Acute renal failure superimposed on stage 4 chronic kidney disease (HCC) 01/09/2014  . Edema 09/03/2013  . Anemia 09/03/2013  . Depression 08/13/2013  . Vitamin D deficiency 08/13/2013  . Paroxysmal atrial fibrillation (HCC) 10/16/2012  . Hypertensive heart disease with CHF (congestive heart failure) (HCC) 10/16/2012  . Chronic combined systolic and diastolic CHF (congestive heart failure) (HCC) 10/16/2012  . Unspecified constipation 10/16/2012  . Osteoarthritis 10/16/2012  . Other malaise and fatigue 10/16/2012  . Incomplete tear of rotator cuff  09/30/2012    CBC    Component Value Date/Time   WBC 7.9 10/05/2015   WBC 7.7 09/29/2015 0503   RBC 3.60 (L) 09/29/2015 0503   HGB 11.5 (A) 10/05/2015   HCT 36 10/05/2015   PLT 310 10/05/2015   MCV 89.4 09/29/2015 0503   LYMPHSABS 2.9 09/27/2015 1754   MONOABS 0.8 09/27/2015 1754   EOSABS 0.2 09/27/2015 1754   BASOSABS 0.0 09/27/2015 1754    CMP     Component Value Date/Time   NA 137 10/05/2015   K 4.7 10/05/2015   CL 104 09/30/2015 0501   CO2 28 09/30/2015 0501   GLUCOSE 92 09/30/2015 0501   BUN 35 (A) 10/05/2015   CREATININE 1.6 (A) 10/05/2015   CREATININE 1.94 (H) 09/30/2015 0501   CALCIUM 9.3 09/30/2015 0501   PROT 7.1 09/24/2014 0550   ALBUMIN 3.3 (L) 09/24/2014 0550   AST 11 (A) 08/09/2015   ALT 10 08/09/2015   ALKPHOS 78 08/09/2015   BILITOT 1.2 09/24/2014 0550   GFRNONAA 22 (L) 09/30/2015 0501   GFRAA 25 (L) 09/30/2015 0501    Assessment and Plan  ENDSTAGE CARDIOMYOPATHY/ ANXIETY -  Per Mrs Allens wishes I will inc Xanax to 0.5 mg BID and 0.25 gm qHS; she looks better than the last time I saw her and in good spirits   Time spent . 25 min;> 50% of time with patient was spent reviewing records, labs, tests and studies, counseling and developing plan of care  Randon Goldsmith. Lyn Hollingshead, MD

## 2015-11-19 ENCOUNTER — Encounter: Payer: Self-pay | Admitting: Internal Medicine

## 2015-11-19 ENCOUNTER — Non-Acute Institutional Stay (SKILLED_NURSING_FACILITY): Payer: Medicare Other | Admitting: Internal Medicine

## 2015-11-19 DIAGNOSIS — N184 Chronic kidney disease, stage 4 (severe): Secondary | ICD-10-CM | POA: Diagnosis not present

## 2015-11-19 DIAGNOSIS — I480 Paroxysmal atrial fibrillation: Secondary | ICD-10-CM

## 2015-11-19 DIAGNOSIS — I110 Hypertensive heart disease with heart failure: Secondary | ICD-10-CM

## 2015-11-19 NOTE — Progress Notes (Signed)
MRN: [id number] Name: Kara Wilkerson  Sex: female Age: 82 y.o. DOB: 1922-09-15  PSC #:  Facility/Room: [facility] / [facility] Level Of Care: SNF Provider: Randon Goldsmith. Lyn Hollingshead, MD Emergency Contacts: Extended Emergency Contact Information Primary Emergency Contact: Huntely,Douschkas Address: [address] [place], [place] [address] D[place] of [place] Home Phone: [phone] Relation: Niece Secondary Emergency Contact: [facility] Address: [address] [place], [place] [address] [place] of [place] Phone: [phone] Relation: Other  Code Status: DNR  Allergies: Codeine  Chief Complaint  Patient presents with  . Medical Management of Chronic Issues    Routine Visit    HPI: Patient is 82 y.o. female with end stage cardiomyopathy who is being seen for routine issues of PAFD, HTN and CKD 3-4.  Past Medical History:  Diagnosis Date  . A-fib (HCC)   . Arthritis   . CHF (congestive heart failure) (HCC)   . Chronic kidney disease    Stage III-IV  . Chronic systolic heart failure (HCC)   . Constipation   . Contracture of ankle and foot joint   . Dependence on supplemental oxygen   . Depression   . Edema   . GERD (gastroesophageal reflux disease)   . Hyperlipidemia   . Hypertension   . Lack of coordination   . Muscle weakness (generalized)   . Other chronic pain   . Unspecified essential hypertension     Past Surgical History:  Procedure Laterality Date  . FRACTURE SURGERY    . right ankle orif        Medication List       Accurate as of 11/19/15  9:57 AM. Always use your most recent med list.          acetaminophen 500 MG tablet Commonly known as:  TYLENOL Take 1,000 mg by mouth 2 (two) times daily.   ALPRAZolam 0.25 MG tablet Commonly known as:  XANAX Take 0.25 mg by mouth 3 (three) times daily. 9 am & 2 pm & bedtime   amiodarone 200 MG tablet Commonly known as:  PACERONE Take 1 tablet (200 mg  total) by mouth 2 (two) times daily.   bisacodyl 10 MG suppository Commonly known as:  DULCOLAX Place 10 mg rectally daily as needed for moderate constipation.   calcium-vitamin D 500-200 MG-UNIT tablet Commonly known as:  OSCAL WITH D Take 1 tablet by mouth 2 (two) times daily.   diclofenac sodium 1 % Gel Commonly known as:  VOLTAREN Apply 4 g topically 2 (two) times daily. To both knees   feeding supplement (ENSURE ENLIVE) Liqd Take 237 mLs by mouth daily.   ferrous sulfate 325 (65 FE) MG tablet Take 325 mg by mouth daily with breakfast.   fluticasone 50 MCG/ACT nasal spray Commonly known as:  FLONASE Place 2 sprays into the nose daily. For allergies   glucosamine-chondroitin 500-400 MG tablet Take 2 tablets by mouth 2 (two) times daily. For osteoarthritis   guaiFENesin 100 MG/5ML Soln Commonly known as:  ROBITUSSIN Take 10 mLs by mouth every 6 (six) hours as needed for cough or to loosen phlegm.   lidocaine 5 % Commonly known as:  LIDODERM Place 1 patch onto the skin daily. Remove & Discard patch within 12 hours to left shoulder   lisinopril 2.5 MG tablet Commonly known as:  PRINIVIL,ZESTRIL Take 2.5 mg by mouth daily. Hold for SBP less than 100.  magnesium hydroxide 400 MG/5ML suspension Commonly known as:  MILK OF MAGNESIA Take 30 mLs by mouth daily as needed for mild constipation or moderate constipation.   meclizine 12.5 MG tablet Commonly known as:  ANTIVERT Take 12.5 mg by mouth 2 (two) times daily as needed for dizziness. For dizziness   oxyCODONE 5 MG immediate release tablet Commonly known as:  Oxy IR/ROXICODONE Take one tablet by mouth every four hours as needed for pain   polyethylene glycol packet Commonly known as:  MIRALAX / GLYCOLAX Take 17 g by mouth daily. For constipation   potassium chloride 20 MEQ packet Commonly known as:  KLOR-CON Take 40 mEq by mouth daily.   RA SALINE ENEMA RE Place 1 Applicatorful rectally daily as needed  (constipation).   Rivaroxaban 15 MG Tabs tablet Commonly known as:  XARELTO Take 15 mg by mouth daily after supper. For A-fib   senna 8.6 MG Tabs tablet Commonly known as:  SENOKOT Take 2 tablets by mouth at bedtime.   sertraline 100 MG tablet Commonly known as:  ZOLOFT Take 100 mg by mouth daily.   SYSTANE 0.4-0.3 % Soln Generic drug:  Polyethyl Glycol-Propyl Glycol Administer one drop in to each eye once daily for eye irritation.   torsemide 20 MG tablet Commonly known as:  DEMADEX Take 20 mg by mouth 2 (two) times daily. For congestive heart failure       Meds ordered this encounter  Medications  . ALPRAZolam (XANAX) 0.25 MG tablet    Sig: Take 0.25 mg by mouth 3 (three) times daily. 9 am & 2 pm & bedtime    Immunization History  Administered Date(s) Administered  . Influenza Whole 01/22/2013  . Influenza-Unspecified 01/11/2012, 01/20/2014, 01/04/2015  . PPD Test 11/20/2011    Social History  Substance Use Topics  . Smoking status: Never Smoker  . Smokeless tobacco: Never Used  . Alcohol use No    Review of Systems  DATA OBTAINED: from patient, nurse, medical record, family member GENERAL:  no fevers, fatigue, appetite changes SKIN: No itching, rash HEENT: No complaint RESPIRATORY: No cough, wheezing, SOB CARDIAC: No chest pain, palpitations, lower extremity edema  GI: No abdominal pain, No N/V/D or constipation, No heartburn or reflux  GU: No dysuria, frequency or urgency, or incontinence  MUSCULOSKELETAL: No unrelieved bone/joint pain NEUROLOGIC: No headache, dizziness  PSYCHIATRIC: No overt anxiety or sadness  Vitals:   11/19/15 0943  BP: 105/74  Pulse: 73  Resp: 16  Temp: 97.9 F (36.6 C)    Physical Exam  GENERAL APPEARANCE: Alert, conversant, No acute distress  SKIN: No diaphoresis rash HEENT: Unremarkable RESPIRATORY: Breathing is even, unlabored. Lung sounds are clear   CARDIOVASCULAR: Heart irreg no murmurs, rubs or gallops. No  peripheral edema  GASTROINTESTINAL: Abdomen is soft, non-tender, not distended w/ normal bowel sounds.  GENITOURINARY: Bladder non tender, not distended  MUSCULOSKELETAL: No abnormal joints or musculature NEUROLOGIC: Cranial nerves 2-12 grossly intact. Moves all extremities PSYCHIATRIC: Mood and affect appropriate to situation, no behavioral issues  Patient Active Problem List   Diagnosis Date Noted  . Chronic diastolic CHF (congestive heart failure), NYHA class 4 (HCC) 11/05/2015  . Pleural effusion 10/03/2015  . Anxiety 10/03/2015  . Benign paroxysmal positional vertigo 10/02/2015  . Encounter for family conference with patient present 10/02/2015  . Acute on chronic congestive heart failure (HCC)   . CHF exacerbation (HCC) 09/27/2015  . Acute HF (heart failure) (HCC) 09/27/2015  . UTI (urinary tract infection) 09/22/2015  .  Atrial fibrillation with RVR (HCC) 09/22/2015  . Acute on chronic combined systolic (congestive) and diastolic (congestive) heart failure (HCC) 09/21/2015  . DNR (do not resuscitate)   . Congestive dilated cardiomyopathy (HCC) 09/03/2015  . Hot flashes 06/17/2015  . Cough 05/24/2015  . Syncope 11/30/2014  . FTT (failure to thrive) in adult 09/29/2014  . Palliative care encounter   . Shortness of breath   . Symptomatic bradycardia 09/24/2014  . Acute renal failure superimposed on stage 4 chronic kidney disease (HCC) 01/09/2014  . Edema 09/03/2013  . Anemia 09/03/2013  . Depression 08/13/2013  . Vitamin D deficiency 08/13/2013  . Paroxysmal atrial fibrillation (HCC) 10/16/2012  . Hypertensive heart disease with CHF (congestive heart failure) (HCC) 10/16/2012  . Chronic combined systolic and diastolic CHF (congestive heart failure) (HCC) 10/16/2012  . Unspecified constipation 10/16/2012  . Osteoarthritis 10/16/2012  . Other malaise and fatigue 10/16/2012  . Incomplete tear of rotator cuff 09/30/2012    CBC    Component Value Date/Time   WBC 7.9  10/05/2015   WBC 7.7 09/29/2015 0503   RBC 3.60 (L) 09/29/2015 0503   HGB 11.5 (A) 10/05/2015   HCT 36 10/05/2015   PLT 310 10/05/2015   MCV 89.4 09/29/2015 0503   LYMPHSABS 2.9 09/27/2015 1754   MONOABS 0.8 09/27/2015 1754   EOSABS 0.2 09/27/2015 1754   BASOSABS 0.0 09/27/2015 1754    CMP     Component Value Date/Time   NA 137 10/05/2015   K 4.7 10/05/2015   CL 104 09/30/2015 0501   CO2 28 09/30/2015 0501   GLUCOSE 92 09/30/2015 0501   BUN 35 (A) 10/05/2015   CREATININE 1.6 (A) 10/05/2015   CREATININE 1.94 (H) 09/30/2015 0501   CALCIUM 9.3 09/30/2015 0501   PROT 7.1 09/24/2014 0550   ALBUMIN 3.3 (L) 09/24/2014 0550   AST 11 (A) 08/09/2015   ALT 10 08/09/2015   ALKPHOS 78 08/09/2015   BILITOT 1.2 09/24/2014 0550   GFRNONAA 22 (L) 09/30/2015 0501   GFRAA 25 (L) 09/30/2015 0501    Assessment and Plan  Hypertension SNF - -controlled Continue lisinopril 2.5 mg daily and demadex 20 mg BID  Paroxysmal Atrial fibrillation SNF -chronic and stable,Continue amiodarone 200 mg BID, xarelto 15 mg daily  Chronic kidney disease, stage III-IV SNF --last Cr 1.6, which is improved from prior;will monitor at intervals    Thurston Hole D. Lyn Hollingshead, MD

## 2015-11-30 ENCOUNTER — Non-Acute Institutional Stay (SKILLED_NURSING_FACILITY): Payer: Medicare Other | Admitting: Internal Medicine

## 2015-11-30 ENCOUNTER — Encounter: Payer: Self-pay | Admitting: Internal Medicine

## 2015-11-30 DIAGNOSIS — F419 Anxiety disorder, unspecified: Secondary | ICD-10-CM

## 2015-11-30 DIAGNOSIS — N184 Chronic kidney disease, stage 4 (severe): Secondary | ICD-10-CM | POA: Insufficient documentation

## 2015-11-30 DIAGNOSIS — H109 Unspecified conjunctivitis: Secondary | ICD-10-CM

## 2015-11-30 NOTE — Progress Notes (Signed)
MRN: [id number] Name: Kara Wilkerson  Sex: female Age: 82 y.o. DOB: 1923-02-05  PSC #:  [facility] [address] Level Of Care: SNF Provider: Randon GoldsmithAnne D. Lyn HollingsheadAlexander, MD Emergency Contacts: Extended Emergency Contact Information Primary Emergency Contact: Huntely,Douschkas Address: [address] [place], [place] [address] [place] Home Phone: [phone] Relation: Niece Secondary Emergency Contact: [facility] Address: [address] [place]          [place], [place] [id number] [place] Mobile Phone: [phone] Relation: Other  Code Status: DNR  Allergies: Codeine  Chief Complaint  Patient presents with  . Acute Visit    Acute     HPI: Patient is 82 y.o. female with endstage cardiomyopathy who Hospice nurse asked me to see. Pt feels SOB when she gets anxious and xanax doesn't last long enough. Can we change her to something else? Also pt has green sticky d/c from her L eye onset this am. Pt admits eye hurts but no one particular place on lid.  Past Medical History:  Diagnosis Date  . A-fib (HCC)   . Arthritis   . CHF (congestive heart failure) (HCC)   . Chronic kidney disease    Stage III-IV  . Chronic systolic heart failure (HCC)   . Constipation   . Contracture of ankle and foot joint   . Dependence on supplemental oxygen   . Depression   . Edema   . GERD (gastroesophageal reflux disease)   . Hyperlipidemia   . Hypertension   . Lack of coordination   . Muscle weakness (generalized)   . Other chronic pain   . Unspecified essential hypertension     Past Surgical History:  Procedure Laterality Date  . FRACTURE SURGERY    . right ankle orif        Medication List       Accurate as of 11/30/15  7:39 PM. Always use your most recent med list.          acetaminophen 500 MG tablet Commonly known as:  TYLENOL Take 1,000 mg by mouth 2 (two) times daily.   ALPRAZolam 0.25 MG tablet Commonly known  as:  XANAX Take 0.25 mg by mouth 3 (three) times daily. 9 am & 2 pm & bedtime   amiodarone 200 MG tablet Commonly known as:  PACERONE Take 1 tablet (200 mg total) by mouth 2 (two) times daily.   bisacodyl 10 MG suppository Commonly known as:  DULCOLAX Place 10 mg rectally daily as needed for moderate constipation.   calcium-vitamin D 500-200 MG-UNIT tablet Commonly known as:  OSCAL WITH D Take 1 tablet by mouth 2 (two) times daily.   diclofenac sodium 1 % Gel Commonly known as:  VOLTAREN Apply 4 g topically 2 (two) times daily. To both knees   feeding supplement (ENSURE ENLIVE) Liqd Take 237 mLs by mouth daily.   ferrous sulfate 325 (65 FE) MG tablet Take 325 mg by mouth daily with breakfast.   fluticasone 50 MCG/ACT nasal spray Commonly known as:  FLONASE Place 2 sprays into the nose daily. For allergies   glucosamine-chondroitin 500-400 MG tablet Take 2 tablets by mouth 2 (two) times daily. For osteoarthritis   guaiFENesin 100 MG/5ML Soln Commonly known as:  ROBITUSSIN Take 10 mLs by mouth every 6 (six) hours as needed for cough or to loosen phlegm.   lidocaine 5 % Commonly known as:  LIDODERM Place 1 patch onto the skin  daily. Remove & Discard patch within 12 hours to left shoulder   lisinopril 2.5 MG tablet Commonly known as:  PRINIVIL,ZESTRIL Take 2.5 mg by mouth daily. Hold for SBP less than 100.   magnesium hydroxide 400 MG/5ML suspension Commonly known as:  MILK OF MAGNESIA Take 30 mLs by mouth daily as needed for mild constipation or moderate constipation.   meclizine 12.5 MG tablet Commonly known as:  ANTIVERT Take 12.5 mg by mouth 2 (two) times daily as needed for dizziness. For dizziness   oxyCODONE 5 MG immediate release tablet Commonly known as:  Oxy IR/ROXICODONE Take one tablet by mouth every four hours as needed for pain   polyethylene glycol packet Commonly known as:  MIRALAX / GLYCOLAX Take 17 g by mouth daily. For constipation    potassium chloride 20 MEQ packet Commonly known as:  KLOR-CON Take 40 mEq by mouth daily.   RA SALINE ENEMA RE Place 1 Applicatorful rectally daily as needed (constipation).   Rivaroxaban 15 MG Tabs tablet Commonly known as:  XARELTO Take 15 mg by mouth daily after supper. For A-fib   senna 8.6 MG Tabs tablet Commonly known as:  SENOKOT Take 2 tablets by mouth at bedtime.   sertraline 100 MG tablet Commonly known as:  ZOLOFT Take 100 mg by mouth daily.   SYSTANE 0.4-0.3 % Soln Generic drug:  Polyethyl Glycol-Propyl Glycol Administer one drop in to each eye once daily for eye irritation.   torsemide 20 MG tablet Commonly known as:  DEMADEX Take 20 mg by mouth 2 (two) times daily. For congestive heart failure       No orders of the defined types were placed in this encounter.   Immunization History  Administered Date(s) Administered  . Influenza Whole 01/22/2013  . Influenza-Unspecified 01/11/2012, 01/20/2014, 01/04/2015  . PPD Test 11/20/2011    Social History  Substance Use Topics  . Smoking status: Never Smoker  . Smokeless tobacco: Never Used  . Alcohol use No    Review of Systems  DATA OBTAINED: from patient, Hospice nurse GENERAL:  no fevers, fatigue, appetite changes SKIN: No itching, rash HEENT: as per HPI RESPIRATORY: No cough, wheezing, SOB CARDIAC: No chest pain, palpitations, lower extremity edema  GI: No abdominal pain, No N/V/D or constipation, No heartburn or reflux  GU: No dysuria, frequency or urgency, or incontinence  MUSCULOSKELETAL: No unrelieved bone/joint pain NEUROLOGIC: No headache, dizziness  PSYCHIATRIC: +anxiety   Vitals:   11/30/15 1343  BP: 105/74  Pulse: (!) 59  Resp: 16  Temp: (!) 96.7 F (35.9 C)    Physical Exam  GENERAL APPEARANCE: Alert, conversant, No acute distress  SKIN: No diaphoresis rash HEENT: mild redness toconjunctiva, cloudy d/c noted, no swelling on upper or loer lids RESPIRATORY: Breathing is  even, unlabored. Lung sounds are clear   CARDIOVASCULAR: Heart RRR no murmurs, rubs or gallops. No peripheral edema  GASTROINTESTINAL: Abdomen is soft, non-tender, not distended w/ normal bowel sounds.  GENITOURINARY: Bladder non tender, not distended  MUSCULOSKELETAL: No abnormal joints or musculature NEUROLOGIC: Cranial nerves 2-12 grossly intact. Moves all extremities PSYCHIATRIC: Mood and affect appropriate to situation, no behavioral issues  Patient Active Problem List   Diagnosis Date Noted  . Chronic diastolic CHF (congestive heart failure), NYHA class 4 (HCC) 11/05/2015  . Pleural effusion 10/03/2015  . Anxiety 10/03/2015  . Benign paroxysmal positional vertigo 10/02/2015  . Encounter for family conference with patient present 10/02/2015  . Acute on chronic congestive heart failure (HCC)   .  CHF exacerbation (HCC) 09/27/2015  . Acute HF (heart failure) (HCC) 09/27/2015  . UTI (urinary tract infection) 09/22/2015  . Atrial fibrillation with RVR (HCC) 09/22/2015  . Acute on chronic combined systolic (congestive) and diastolic (congestive) heart failure (HCC) 09/21/2015  . DNR (do not resuscitate)   . Congestive dilated cardiomyopathy (HCC) 09/03/2015  . Hot flashes 06/17/2015  . Cough 05/24/2015  . Syncope 11/30/2014  . FTT (failure to thrive) in adult 09/29/2014  . Palliative care encounter   . Shortness of breath   . Symptomatic bradycardia 09/24/2014  . Acute renal failure superimposed on stage 4 chronic kidney disease (HCC) 01/09/2014  . Edema 09/03/2013  . Anemia 09/03/2013  . Depression 08/13/2013  . Vitamin D deficiency 08/13/2013  . Paroxysmal atrial fibrillation (HCC) 10/16/2012  . Hypertensive heart disease with CHF (congestive heart failure) (HCC) 10/16/2012  . Chronic combined systolic and diastolic CHF (congestive heart failure) (HCC) 10/16/2012  . Unspecified constipation 10/16/2012  . Osteoarthritis 10/16/2012  . Other malaise and fatigue 10/16/2012  .  Incomplete tear of rotator cuff 09/30/2012    CBC    Component Value Date/Time   WBC 7.9 10/05/2015   WBC 7.7 09/29/2015 0503   RBC 3.60 (L) 09/29/2015 0503   HGB 11.5 (A) 10/05/2015   HCT 36 10/05/2015   PLT 310 10/05/2015   MCV 89.4 09/29/2015 0503   LYMPHSABS 2.9 09/27/2015 1754   MONOABS 0.8 09/27/2015 1754   EOSABS 0.2 09/27/2015 1754   BASOSABS 0.0 09/27/2015 1754    CMP     Component Value Date/Time   NA 137 10/05/2015   K 4.7 10/05/2015   CL 104 09/30/2015 0501   CO2 28 09/30/2015 0501   GLUCOSE 92 09/30/2015 0501   BUN 35 (A) 10/05/2015   CREATININE 1.6 (A) 10/05/2015   CREATININE 1.94 (H) 09/30/2015 0501   CALCIUM 9.3 09/30/2015 0501   PROT 7.1 09/24/2014 0550   ALBUMIN 3.3 (L) 09/24/2014 0550   AST 11 (A) 08/09/2015   ALT 10 08/09/2015   ALKPHOS 78 08/09/2015   BILITOT 1.2 09/24/2014 0550   GFRNONAA 22 (L) 09/30/2015 0501   GFRAA 25 (L) 09/30/2015 0501    Assessment and Plan  ANXIETY - have changed xanax to the loner acting ativan 0.5 mg BID and qHS with q4 prn also  CONJUNCTIVITIS- garamycin drops QID for 7 days   Time spent > 25 min;> 50% of time with patient was spent reviewing records, labs, tests and studies, counseling and developing plan of care  Thurston Hole D. Lyn Hollingshead, MD

## 2015-12-17 ENCOUNTER — Encounter: Payer: Self-pay | Admitting: Internal Medicine

## 2015-12-17 ENCOUNTER — Non-Acute Institutional Stay (SKILLED_NURSING_FACILITY): Payer: Medicare Other | Admitting: Internal Medicine

## 2015-12-17 DIAGNOSIS — I420 Dilated cardiomyopathy: Secondary | ICD-10-CM | POA: Diagnosis not present

## 2015-12-18 ENCOUNTER — Encounter: Payer: Self-pay | Admitting: Internal Medicine

## 2016-01-09 NOTE — Progress Notes (Signed)
MRN: [id number] Name: Kara Wilkerson  Sex: female Age: 82 y.o. DOB: 29-Mar-1923  PSC #:  Facility/Room:[facility] / [address] Level Of Care: SNF Provider: Randon GoldsmithAnne D. Lyn HollingsheadAlexander, MD Emergency Contacts: Extended Emergency Contact Information Primary Emergency Contact: Huntely,Douschkas Address: [address]          [place], [place] [address] [place] Home Phone: [phone] Relation: Niece Secondary Emergency Contact: [facility] Address: [address]          [place], [place] [id number] [place] Mobile Phone: [phone] Relation: Other  Code Status: DNR  Allergies: Codeine  Chief Complaint  Patient presents with  . Acute Visit    Acute    HPI: Patient is 82 y.o. female Hospice pt for end stage cardiomyopathy who Hospice nurse asked me to see for acute change in status. Earlier today pt was noted to be her normal self, I saw her also going to the DR for lunch. Now pt is minimally response with increased RR.   Past Medical History:  Diagnosis Date  . A-fib (HCC)   . Arthritis   . CHF (congestive heart failure) (HCC)   . Chronic kidney disease    Stage III-IV  . Chronic systolic heart failure (HCC)   . Constipation   . Contracture of ankle and foot joint   . Dependence on supplemental oxygen   . Depression   . Edema   . GERD (gastroesophageal reflux disease)   . Hyperlipidemia   . Hypertension   . Lack of coordination   . Muscle weakness (generalized)   . Other chronic pain   . Unspecified essential hypertension     Past Surgical History:  Procedure Laterality Date  . FRACTURE SURGERY    . right ankle orif        Medication List       Accurate as of 01/02/2016  4:05 PM. Always use your most recent med list.          acetaminophen 500 MG tablet Commonly known as:  TYLENOL Take 1,000 mg by mouth 2 (two) times daily.   ALPRAZolam 0.25 MG tablet Commonly known as:  XANAX Take 0.25 mg by mouth 3 (three)  times daily. 9 am & 2 pm & bedtime   amiodarone 200 MG tablet Commonly known as:  PACERONE Take 1 tablet (200 mg total) by mouth 2 (two) times daily.   bisacodyl 10 MG suppository Commonly known as:  DULCOLAX Place 10 mg rectally daily as needed for moderate constipation.   calcium-vitamin D 500-200 MG-UNIT tablet Commonly known as:  OSCAL WITH D Take 1 tablet by mouth 2 (two) times daily.   diclofenac sodium 1 % Gel Commonly known as:  VOLTAREN Apply 4 g topically 2 (two) times daily. To both knees   feeding supplement (ENSURE ENLIVE) Liqd Take 237 mLs by mouth daily.   ferrous sulfate 325 (65 FE) MG tablet Take 325 mg by mouth daily with breakfast.   fluticasone 50 MCG/ACT nasal spray Commonly known as:  FLONASE Place 2 sprays into the nose daily. For allergies   glucosamine-chondroitin 500-400 MG tablet Take 2 tablets by mouth 2 (two) times daily. For osteoarthritis   guaiFENesin 100 MG/5ML Soln Commonly known as:  ROBITUSSIN Take 10 mLs by mouth every 6 (six) hours as needed for cough or to loosen phlegm.   lidocaine 5 % Commonly known as:  LIDODERM Place 1 patch onto the skin daily. Remove & Discard patch within 12 hours to  left shoulder   lisinopril 2.5 MG tablet Commonly known as:  PRINIVIL,ZESTRIL Take 2.5 mg by mouth daily. Hold for SBP less than 100.   magnesium hydroxide 400 MG/5ML suspension Commonly known as:  MILK OF MAGNESIA Take 30 mLs by mouth daily as needed for mild constipation or moderate constipation.   meclizine 12.5 MG tablet Commonly known as:  ANTIVERT Take 12.5 mg by mouth 2 (two) times daily as needed for dizziness. For dizziness   oxyCODONE 5 MG immediate release tablet Commonly known as:  Oxy IR/ROXICODONE Take one tablet by mouth every four hours as needed for pain   polyethylene glycol packet Commonly known as:  MIRALAX / GLYCOLAX Take 17 g by mouth daily. For constipation   potassium chloride 20 MEQ packet Commonly known as:   KLOR-CON Take 40 mEq by mouth daily.   RA SALINE ENEMA RE Place 1 Applicatorful rectally daily as needed (constipation).   Rivaroxaban 15 MG Tabs tablet Commonly known as:  XARELTO Take 15 mg by mouth daily after supper. For A-fib   senna 8.6 MG Tabs tablet Commonly known as:  SENOKOT Take 2 tablets by mouth at bedtime.   sertraline 100 MG tablet Commonly known as:  ZOLOFT Take 100 mg by mouth daily.   SYSTANE 0.4-0.3 % Soln Generic drug:  Polyethyl Glycol-Propyl Glycol Administer one drop in to each eye once daily for eye irritation.   torsemide 20 MG tablet Commonly known as:  DEMADEX Take 20 mg by mouth 2 (two) times daily. For congestive heart failure       No orders of the defined types were placed in this encounter.   Immunization History  Administered Date(s) Administered  . Influenza Whole 01/22/2013  . Influenza-Unspecified 01/11/2012, 01/20/2014, 01/04/2015  . PPD Test 11/20/2011    Social History  Substance Use Topics  . Smoking status: Never Smoker  . Smokeless tobacco: Never Used  . Alcohol use No    Review of Systems  UTO 2/2 pt condition; as per HPI    Vitals:   Dec 26, 2015 1603  BP: 105/74  Pulse: 66  Resp: 16  Temp: 97.8 F (36.6 C)    Physical Exam  GENERAL APPEARANCE: minimally responsive SKIN: No diaphoresis rash HEENT: Unremarkable RESPIRATORY: Breathing is even, inc RR. Lung sounds are clear; O2 sat on 2L 93% CARDIOVASCULAR: Heart irreg no murmurs, rubs or gallops. No peripheral edema  GASTROINTESTINAL: Abdomen is soft, non-tender, not distended w/ normal bowel sounds.  GENITOURINARY: Bladder non tender, not distended  MUSCULOSKELETAL: No abnormal joints or musculature NEUROLOGIC: Cranial nerves 2-12 grossly intact. Moves all extremities PSYCHIATRIC: n/a  Patient Active Problem List   Diagnosis Date Noted  . CKD (chronic kidney disease) stage 4, GFR 15-29 ml/min (HCC) 11/30/2015  . Chronic diastolic CHF (congestive heart  failure), NYHA class 4 (HCC) 11/05/2015  . Pleural effusion 10/03/2015  . Anxiety 10/03/2015  . Benign paroxysmal positional vertigo 10/02/2015  . Encounter for family conference with patient present 10/02/2015  . Acute on chronic congestive heart failure (HCC)   . CHF exacerbation (HCC) 09/27/2015  . Acute HF (heart failure) (HCC) 09/27/2015  . UTI (urinary tract infection) 09/22/2015  . Atrial fibrillation with RVR (HCC) 09/22/2015  . Acute on chronic combined systolic (congestive) and diastolic (congestive) heart failure (HCC) 09/21/2015  . DNR (do not resuscitate)   . Congestive dilated cardiomyopathy (HCC) 09/03/2015  . Hot flashes 06/17/2015  . Cough 05/24/2015  . Syncope 11/30/2014  . FTT (failure to thrive) in adult 09/29/2014  .  Palliative care encounter   . Shortness of breath   . Symptomatic bradycardia 09/24/2014  . Acute renal failure superimposed on stage 4 chronic kidney disease (HCC) 01/09/2014  . Edema 09/03/2013  . Anemia 09/03/2013  . Depression 08/13/2013  . Vitamin D deficiency 08/13/2013  . Paroxysmal atrial fibrillation (HCC) 10/16/2012  . Hypertensive heart disease with CHF (congestive heart failure) (HCC) 10/16/2012  . Chronic combined systolic and diastolic CHF (congestive heart failure) (HCC) 10/16/2012  . Unspecified constipation 10/16/2012  . Osteoarthritis 10/16/2012  . Other malaise and fatigue 10/16/2012  . Incomplete tear of rotator cuff 09/30/2012    CBC    Component Value Date/Time   WBC 7.9 10/05/2015   WBC 7.7 09/29/2015 0503   RBC 3.60 (L) 09/29/2015 0503   HGB 11.5 (A) 10/05/2015   HCT 36 10/05/2015   PLT 310 10/05/2015   MCV 89.4 09/29/2015 0503   LYMPHSABS 2.9 09/27/2015 1754   MONOABS 0.8 09/27/2015 1754   EOSABS 0.2 09/27/2015 1754   BASOSABS 0.0 09/27/2015 1754    CMP     Component Value Date/Time   NA 137 10/05/2015   K 4.7 10/05/2015   CL 104 09/30/2015 0501   CO2 28 09/30/2015 0501   GLUCOSE 92 09/30/2015 0501    BUN 35 (A) 10/05/2015   CREATININE 1.6 (A) 10/05/2015   CREATININE 1.94 (H) 09/30/2015 0501   CALCIUM 9.3 09/30/2015 0501   PROT 7.1 09/24/2014 0550   ALBUMIN 3.3 (L) 09/24/2014 0550   AST 11 (A) 08/09/2015   ALT 10 08/09/2015   ALKPHOS 78 08/09/2015   BILITOT 1.2 09/24/2014 0550   GFRNONAA 22 (L) 09/30/2015 0501   GFRAA 25 (L) 09/30/2015 0501    Assessment and Plan  CHANGE IN STATUS/ END STAGE CARDIOMYOPATHY - will dec LOC and increased work of breathing and RR- does not appear to be a stroke, MI perhaps?- in any case diagnosis is not the priority, comfort care is. Have inc Xanax coverage and have written for morphine scheduled q 6 since the weekend is upon [place]  or breathing and anxiety   Time spent > 35 min Anne D. Lyn Hollingshead, MD

## 2016-01-09 DEATH — deceased

## 2017-12-11 IMAGING — CR DG CHEST 2V
1 series · 1 of 1 positions shown · non-contrast
Comparison: September 27, 2015.

CLINICAL DATA: Pleural effusion.

EXAM:
CHEST  2 VIEW

[w chest lat]
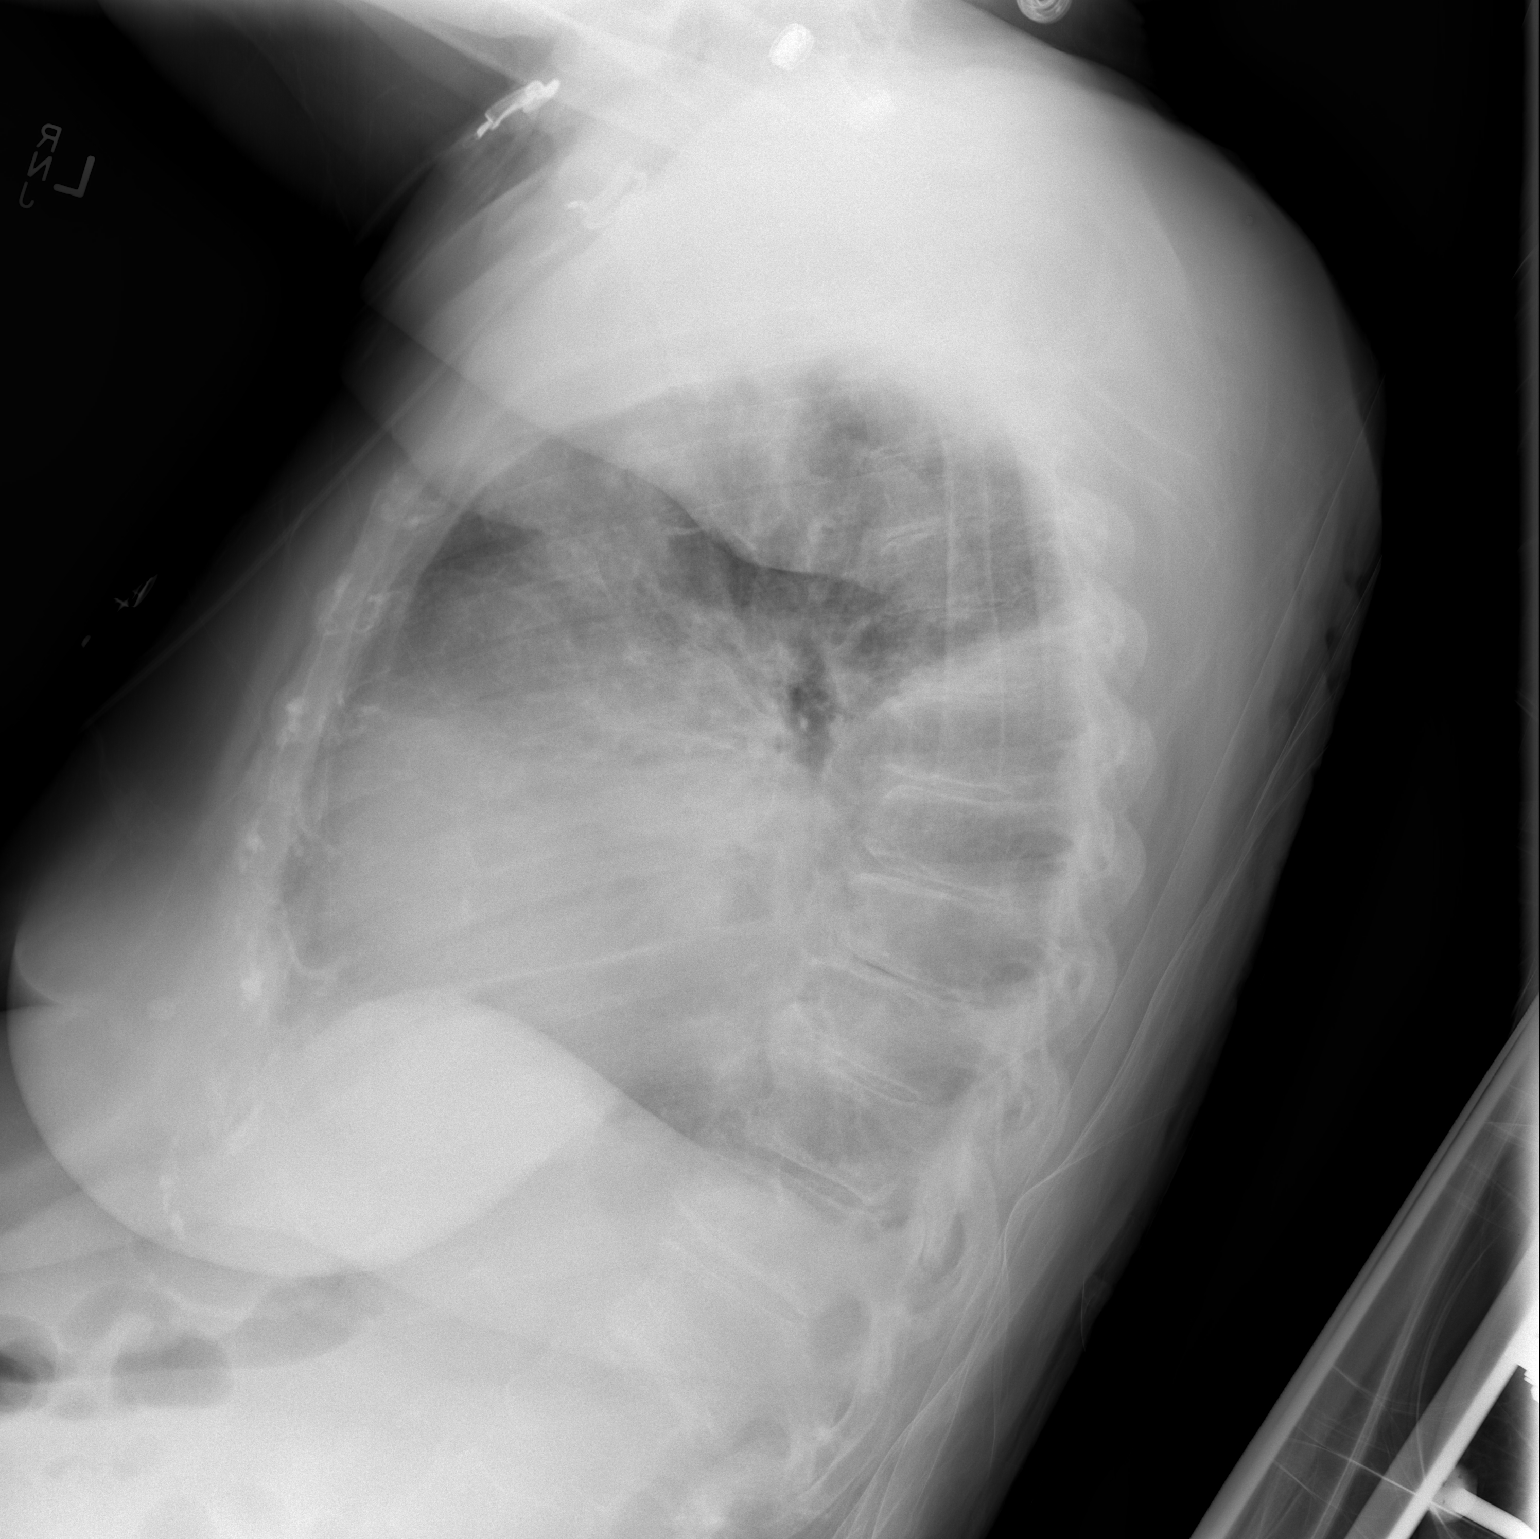

[1 of 1 positions shown; findings below may reference images not displayed]

FINDINGS: Stable cardiomegaly. No pneumothorax is noted. Right lung is clear.
Stable moderate left pleural effusion is noted with probable
underlying atelectasis or infiltrate. Degenerative change of the
left glenohumeral joint is noted. Multilevel degenerative disc
disease is noted in lower thoracic spine.
IMPRESSION: Stable moderate left pleural effusion with probable underlying
atelectasis or infiltrate.
